# Patient Record
Sex: Male | Born: 1965 | Race: White | Hispanic: No | Smoking: Former smoker
Health system: Southern US, Community
[De-identification: ages and names within clinical notes are randomized; demographics above are authoritative.]

## PROBLEM LIST (undated history)

## (undated) DIAGNOSIS — Z87442 Personal history of urinary calculi: Secondary | ICD-10-CM

## (undated) DIAGNOSIS — R519 Headache, unspecified: Secondary | ICD-10-CM

## (undated) DIAGNOSIS — D3500 Benign neoplasm of unspecified adrenal gland: Secondary | ICD-10-CM

## (undated) DIAGNOSIS — R51 Headache: Secondary | ICD-10-CM

## (undated) DIAGNOSIS — K8020 Calculus of gallbladder without cholecystitis without obstruction: Secondary | ICD-10-CM

## (undated) DIAGNOSIS — I2510 Atherosclerotic heart disease of native coronary artery without angina pectoris: Secondary | ICD-10-CM

## (undated) DIAGNOSIS — A549 Gonococcal infection, unspecified: Secondary | ICD-10-CM

## (undated) DIAGNOSIS — I639 Cerebral infarction, unspecified: Secondary | ICD-10-CM

## (undated) DIAGNOSIS — N200 Calculus of kidney: Secondary | ICD-10-CM

## (undated) DIAGNOSIS — F4321 Adjustment disorder with depressed mood: Secondary | ICD-10-CM

## (undated) DIAGNOSIS — R319 Hematuria, unspecified: Secondary | ICD-10-CM

## (undated) DIAGNOSIS — T8859XA Other complications of anesthesia, initial encounter: Secondary | ICD-10-CM

## (undated) DIAGNOSIS — K219 Gastro-esophageal reflux disease without esophagitis: Secondary | ICD-10-CM

## (undated) DIAGNOSIS — T4145XA Adverse effect of unspecified anesthetic, initial encounter: Secondary | ICD-10-CM

## (undated) DIAGNOSIS — I219 Acute myocardial infarction, unspecified: Secondary | ICD-10-CM

## (undated) DIAGNOSIS — A6920 Lyme disease, unspecified: Secondary | ICD-10-CM

## (undated) DIAGNOSIS — R011 Cardiac murmur, unspecified: Secondary | ICD-10-CM

## (undated) HISTORY — PX: APPENDECTOMY: SHX54

## (undated) HISTORY — PX: INGUINAL HERNIA REPAIR: SUR1180

## (undated) HISTORY — PX: LITHOTRIPSY: SUR834

---

## 1998-04-17 ENCOUNTER — Emergency Department (HOSPITAL_COMMUNITY): Admission: EM | Admit: 1998-04-17 | Discharge: 1998-04-17 | Payer: Self-pay | Admitting: Emergency Medicine

## 2002-03-16 ENCOUNTER — Encounter: Payer: Self-pay | Admitting: *Deleted

## 2002-03-16 ENCOUNTER — Emergency Department (HOSPITAL_COMMUNITY): Admission: EM | Admit: 2002-03-16 | Discharge: 2002-03-16 | Payer: Self-pay | Admitting: *Deleted

## 2003-10-21 DIAGNOSIS — I219 Acute myocardial infarction, unspecified: Secondary | ICD-10-CM

## 2003-10-21 HISTORY — PX: CORONARY ANGIOPLASTY WITH STENT PLACEMENT: SHX49

## 2003-10-21 HISTORY — DX: Acute myocardial infarction, unspecified: I21.9

## 2004-05-04 ENCOUNTER — Inpatient Hospital Stay (HOSPITAL_COMMUNITY): Admission: EM | Admit: 2004-05-04 | Discharge: 2004-05-07 | Payer: Self-pay | Admitting: Emergency Medicine

## 2004-06-18 ENCOUNTER — Emergency Department (HOSPITAL_COMMUNITY): Admission: EM | Admit: 2004-06-18 | Discharge: 2004-06-18 | Payer: Self-pay | Admitting: Emergency Medicine

## 2004-07-04 ENCOUNTER — Ambulatory Visit (HOSPITAL_COMMUNITY): Admission: RE | Admit: 2004-07-04 | Discharge: 2004-07-04 | Payer: Self-pay | Admitting: Urology

## 2006-01-21 ENCOUNTER — Emergency Department (HOSPITAL_COMMUNITY): Admission: EM | Admit: 2006-01-21 | Discharge: 2006-01-21 | Payer: Self-pay | Admitting: Emergency Medicine

## 2009-07-18 ENCOUNTER — Emergency Department (HOSPITAL_COMMUNITY): Admission: EM | Admit: 2009-07-18 | Discharge: 2009-07-18 | Payer: Self-pay | Admitting: Emergency Medicine

## 2011-01-24 LAB — CBC
HCT: 38.2 % — ABNORMAL LOW (ref 39.0–52.0)
Hemoglobin: 12.9 g/dL — ABNORMAL LOW (ref 13.0–17.0)
MCV: 92.3 fL (ref 78.0–100.0)
Platelets: 265 10*3/uL (ref 150–400)
RBC: 4.14 MIL/uL — ABNORMAL LOW (ref 4.22–5.81)
WBC: 13.8 10*3/uL — ABNORMAL HIGH (ref 4.0–10.5)

## 2011-01-24 LAB — BASIC METABOLIC PANEL
CO2: 26 mEq/L (ref 19–32)
Chloride: 105 mEq/L (ref 96–112)
Creatinine, Ser: 0.85 mg/dL (ref 0.4–1.5)
GFR calc Af Amer: >60 mL/min (ref >60)
Glucose, Bld: 122 mg/dL — ABNORMAL HIGH (ref 70–99)

## 2011-01-24 LAB — URINALYSIS, ROUTINE W REFLEX MICROSCOPIC
Glucose, UA: NEGATIVE mg/dL
Nitrite: NEGATIVE
Specific Gravity, Urine: 1.016 (ref 1.005–1.030)
pH: 6 (ref 5.0–8.0)

## 2011-01-24 LAB — URINE CULTURE

## 2011-01-24 LAB — URINE MICROSCOPIC-ADD ON

## 2011-01-24 LAB — DIFFERENTIAL
Eosinophils Relative: 0 % (ref 0–5)
Lymphocytes Relative: 10 % — ABNORMAL LOW (ref 12–46)
Lymphs Abs: 1.4 10*3/uL (ref 0.7–4.0)
Monocytes Relative: 4 % (ref 3–12)

## 2011-03-07 NOTE — Discharge Summary (Signed)
Leon Allen, Leon Allen                        ACCOUNT NO.:  [id number]   MEDICAL RECORD NO.:  [id number]                   PATIENT TYPE:  INP   LOCATION:  2023                                 FACILITY:  [facility]   PHYSICIAN:  Vesta Mixer, M.D.              DATE OF BIRTH:  10-May-1966   DATE OF ADMISSION:  05/04/2004  DATE OF DISCHARGE:  05/07/2004                                 DISCHARGE SUMMARY   DISCHARGE DIAGNOSES:  1. Acute anterior apical/inferior myocardial infarction.  2. History of cigarette smoking.   DISCHARGE MEDICATIONS:  1. Aspirin 325 mg a day.  2. Plavix 75 mg a day.  3. Metoprolol 25 mg p.o. b.i.d.  4. Altace 2.5 mg a day.  5. Nitroglycerin 0.4 mg sublingually as needed for chest pain.   DISPOSITION:  The patient is to eat a low-fat, low cholesterol diet. He is  to see Dr. Elease Hashimoto in 1-2 weeks.   HISTORY:  Leon Allen is a 45 year old gentleman with no significant past  medical history. He presented on May 04, 2004 with episodes of chest pain  and an EKG that suggested an inferior wall myocardial infarction (please see  History and Physical for further details).   HOSPITAL COURSE:  Acute myocardial infarction.  The patient was taken to the  catheterization lab. He was found to have a plaque rupture in his mid-left  anterior descending artery. The LAD reached across the apex and supplied the  inferior apical wall. There was evidence of a thrombus in the anterior  apical segment which probably originated in the mid-LAD. He underwent  successful PTCA and stenting of the mid-LAD stenosis using a 3.5 x 15 mm  Taxis stent post-dilated, using a 4.0 Powersail balloon. He tolerated the  procedure quite well. We were not able to advance the balloon down past the  embolus. We treated him with Integrilin and heparin for this embolus. He  will also be treated with Plavix, and this should resolve some of this  embolus. He did have some AIVR the following day which suggests  that the  thrombus had begun to resolve. The patient did well for the next several  days and was up ambulating without problems. He will be discharged on the  above-noted medications. A fasting lipid profile is pending on the day of  discharge, and we will start him on stat medications if appropriate.                                                Vesta Mixer, M.D.    PJN/MEDQ  D:  05/07/2004  T:  05/07/2004  Job:  [id number]

## 2011-03-07 NOTE — Cardiovascular Report (Signed)
NAME:  GLADE, STRAUSSER NO.:  [id number]   MEDICAL RECORD NO.:  [id number]                   PATIENT TYPE:  INP   LOCATION:  1830                                 FACILITY:  [facility]   PHYSICIAN:  Vesta Mixer, M.D.              DATE OF BIRTH:  11-Feb-1966   DATE OF PROCEDURE:  05/05/2004  DATE OF DISCHARGE:                              CARDIAC CATHETERIZATION   Mr. Shillingburg is a 45 year old gentleman who started having chest pain earlier  this evening.  He was brought to the emergency room and found to have ST  elevation in the inferior leads.  He as brought to the catheterization lab  for urgent heart catheterization.   PROCEDURE:  Left heart catheterization with coronary angiography and  percutaneous transluminal cardiac angioplasty and stenting of the mid left  anterior descending artery.   CARDIOLOGIST:  Vesta Mixer, M.D.   DESCRIPTION OF PROCEDURE:  The right femoral artery was easily cannulated  using a modified  Seldinger technique.   HEMODYNAMICS:  1. Left ventricular pressure 111/12/.  2. Aortic pressure 107/71.   ANGIOGRAPHY:  1. The left main is smooth and normal.  2. The left anterior descending artery is normal in the proximal segment.     The mid lesion has an ulcerated plaque which represents a 75 to 80%     stenosis.  The distal LAD is fairly normal.  The LAD wraps around the     apex and supplies at least the distal third to distal one-half of the     inferior apical wall.  There is a thrombus that obstructs the flow in the     distal-most aspect of the LAD.  3. The left circumflex artery is fairly large and is fairly normal.  4. The right coronary artery is large and dominant.  It is smooth and normal     throughout its course.  The posterior descending artery and the     posterolateral segment artery are normal.   Left ventriculogram was performed in a 30 RAO position.  It reveals mildly  depressed left ventricular  systolic function with an ejection fraction of  around 45 to 50%.  There is moderate hypokinesis of the intra-apical wall  which corresponds to the location of the distal LAD obstruction.   PTCA:  A 7-French Judkins left 4 guide was used to cannulate the left main;  4300 units of heparin were given followed by a double bolus Integrilin drip.  A BMW wire was placed down into the left anterior descending artery followed  by a 3.5 x 16 mm Taxus stent.  This stent was deployed at 16 atmospheres at  the mid LAD.  The stent balloon was removed, and post stent dilatation was  achieved using a 4.0 x 15 mm Powersail balloon.  It was inflated twice up to  12 atmospheres for 20 seconds each.  This resulted in  a significantly  improved vessel lumen.  There was brisk flow.  There were no complications.   The patient will be treated with Integrilin for 18 hours. We will not be  able to dislodge that thrombus, but hopefully it will resolve on the  Integrilin and heparin.   COMPLICATIONS:  None.   CONCLUSIONS:  1. Successful percutaneous transluminal cardiac angioplasty and stenting of     the mid left anterior descending artery.  2. Acute anteroapical and apical inferior myocardial infarction.                                               Vesta Mixer, M.D.    PJN/MEDQ  D:  05/05/2004  T:  05/05/2004  Job:  [id number]

## 2011-06-01 ENCOUNTER — Emergency Department (HOSPITAL_COMMUNITY)
Admission: EM | Admit: 2011-06-01 | Discharge: 2011-06-01 | Disposition: A | Discharge status: Home / Self Care | Payer: Self-pay | Attending: Emergency Medicine | Admitting: Emergency Medicine

## 2011-06-01 ENCOUNTER — Emergency Department (HOSPITAL_COMMUNITY): Payer: Self-pay

## 2011-06-01 DIAGNOSIS — N201 Calculus of ureter: Secondary | ICD-10-CM | POA: Insufficient documentation

## 2011-06-01 DIAGNOSIS — R109 Unspecified abdominal pain: Secondary | ICD-10-CM | POA: Insufficient documentation

## 2011-06-01 DIAGNOSIS — Z7982 Long term (current) use of aspirin: Secondary | ICD-10-CM | POA: Insufficient documentation

## 2011-06-01 DIAGNOSIS — R110 Nausea: Secondary | ICD-10-CM | POA: Insufficient documentation

## 2011-06-01 DIAGNOSIS — N1330 Unspecified hydronephrosis: Secondary | ICD-10-CM | POA: Insufficient documentation

## 2011-06-01 DIAGNOSIS — I252 Old myocardial infarction: Secondary | ICD-10-CM | POA: Insufficient documentation

## 2011-06-01 LAB — POCT I-STAT, CHEM 8
Calcium, Ion: 1.14 mmol/L (ref 1.12–1.32)
Chloride: 107 mEq/L (ref 96–112)
Creatinine, Ser: 1.1 mg/dL (ref 0.50–1.35)
Glucose, Bld: 139 mg/dL — ABNORMAL HIGH (ref 70–99)
Hemoglobin: 16 g/dL (ref 13.0–17.0)
Potassium: 4.2 mEq/L (ref 3.5–5.1)

## 2011-06-01 LAB — URINALYSIS, ROUTINE W REFLEX MICROSCOPIC
Bilirubin Urine: NEGATIVE
Specific Gravity, Urine: 1.023 (ref 1.005–1.030)
pH: 5.5 (ref 5.0–8.0)

## 2011-06-01 LAB — URINE MICROSCOPIC-ADD ON

## 2011-06-02 LAB — URINE CULTURE

## 2012-05-03 ENCOUNTER — Encounter (HOSPITAL_COMMUNITY): Payer: Self-pay | Admitting: Emergency Medicine

## 2012-05-03 ENCOUNTER — Emergency Department (INDEPENDENT_AMBULATORY_CARE_PROVIDER_SITE_OTHER)
Admission: EM | Admit: 2012-05-03 | Discharge: 2012-05-03 | Disposition: A | Discharge status: Home / Self Care | Payer: Self-pay | Source: Home / Self Care

## 2012-05-03 ENCOUNTER — Emergency Department (INDEPENDENT_AMBULATORY_CARE_PROVIDER_SITE_OTHER): Payer: Self-pay

## 2012-05-03 DIAGNOSIS — S060X9A Concussion with loss of consciousness of unspecified duration, initial encounter: Secondary | ICD-10-CM

## 2012-05-03 DIAGNOSIS — S52209A Unspecified fracture of shaft of unspecified ulna, initial encounter for closed fracture: Secondary | ICD-10-CM

## 2012-05-03 HISTORY — DX: Acute myocardial infarction, unspecified: I21.9

## 2012-05-03 NOTE — ED Notes (Signed)
PT HEREWITH RIGHT LATERAL SWELLING AND POSS FX FROM TREE INJURY AT 3 PM TODAY.STATES HE WAS ON A TRACTOR AND BUMPED A TREE AND A TREE LIMB BROKE IN TWO PIECES HITTING LEFT SIDE OF FACE AND ARM.SWELLING AND TENDER WITH HUGE BRUISE TO L SIDE FACE.ABLE TO MAKE FIST BUT UNABLE TO MOVE ARM.ELEVATED/ICE

## 2012-05-03 NOTE — ED Notes (Signed)
Updated on wait.  Ortho tech en route.

## 2012-05-03 NOTE — ED Provider Notes (Signed)
Leon Allen is a 46 y.o. male who presents to Urgent Care today for right arm fracture. The patient was on his tractor today when a tree branch fell on to him. He guarded his head with his forearm. He suffered immediate pain and swelling of the ulnar forearm.  The tree branch also hit his head. He does not think he lost consciousness it is no longer having any headache or loss of coordination. He feels well  without any pain otherwise.  PMH reviewed.  History of a myocardial infarction 11 years ago History  Substance Use Topics  . Smoking status: Not on file  . Smokeless tobacco: Not on file  . Alcohol Use:    ROS as above Medications reviewed. No current facility-administered medications for this encounter.   Current Outpatient Prescriptions  Medication Sig Dispense Refill  . aspirin 81 MG chewable tablet Chew 81 mg by mouth daily.        Exam:  BP 149/90  Pulse 78  Temp 98 F (36.7 C) (Oral)  Resp 18  SpO2 97% Gen: Well NAD HEENT: EOMI,  MMM  Bruise on left jaw at the level of TMJ. No other bruises on head. Left arm: Tender at the mid ulna.  Normal sensation and capillary refill distally.  Neuro: Alert and oriented normally conversant. Pupils equal round reactive to light. Nonfocal exam  No results found for this or any previous visit (from the past 24 hour(s)). Dg Forearm Right  05/03/2012  *RADIOLOGY REPORT*  Clinical Data: Right arm trauma and pain  RIGHT FOREARM - 2 VIEW  Comparison: None.  Findings: There is an oblique fractures of the midshaft of the right ulna.  Overlying soft tissue swelling is noted.  No radiopaque foreign body.  Allowing for technique, no dislocation is seen.  IMPRESSION: Oblique fracture through the mid shaft of the right ulna.  Original Report Authenticated By: Harrel Lemon, M.D.    Assessment and Plan: 46 y.o. male with ulnar fracture. Plan to place patient in a posterior arm splint, and have patient followup with orthopedics in a few  days. [facility] is on call tonight; will refer there.   Additionally he may have a concussion. Discussed warning signs or symptoms patient expresses understanding.

## 2012-05-03 NOTE — Progress Notes (Signed)
Orthopedic Tech Progress Note Patient Details:  Leon Allen 12-Apr-1966 [id number]  Ortho Devices Type of Ortho Device: Long arm splint Ortho Device/Splint Location: left arm Ortho Device/Splint Interventions: Application   Nikki Dom 05/03/2012, 6:45 PM

## 2012-05-03 NOTE — ED Notes (Signed)
Ortho tech present. 

## 2012-05-06 NOTE — ED Provider Notes (Signed)
Medical screening examination/treatment/procedure(s) were performed by a resident physician and as supervising physician I was immediately available for consultation/collaboration.  Leslee Home, M.D.   Reuben Likes, MD 05/06/12 2105

## 2013-09-23 ENCOUNTER — Other Ambulatory Visit: Payer: Self-pay | Admitting: Pulmonary Disease

## 2013-10-20 DIAGNOSIS — A549 Gonococcal infection, unspecified: Secondary | ICD-10-CM

## 2013-10-20 DIAGNOSIS — I639 Cerebral infarction, unspecified: Secondary | ICD-10-CM

## 2013-10-20 HISTORY — DX: Gonococcal infection, unspecified: A54.9

## 2013-10-20 HISTORY — DX: Cerebral infarction, unspecified: I63.9

## 2014-06-14 ENCOUNTER — Emergency Department (HOSPITAL_COMMUNITY)
Admission: EM | Admit: 2014-06-14 | Discharge: 2014-06-14 | Disposition: A | Discharge status: Home / Self Care | Payer: Self-pay | Attending: Emergency Medicine | Admitting: Emergency Medicine

## 2014-06-14 ENCOUNTER — Encounter (HOSPITAL_COMMUNITY): Payer: Self-pay | Admitting: Emergency Medicine

## 2014-06-14 ENCOUNTER — Emergency Department (HOSPITAL_COMMUNITY): Payer: Self-pay

## 2014-06-14 DIAGNOSIS — I252 Old myocardial infarction: Secondary | ICD-10-CM | POA: Insufficient documentation

## 2014-06-14 DIAGNOSIS — R42 Dizziness and giddiness: Secondary | ICD-10-CM | POA: Insufficient documentation

## 2014-06-14 DIAGNOSIS — R519 Headache, unspecified: Secondary | ICD-10-CM

## 2014-06-14 DIAGNOSIS — R51 Headache: Secondary | ICD-10-CM | POA: Insufficient documentation

## 2014-06-14 DIAGNOSIS — Z880 Allergy status to penicillin: Secondary | ICD-10-CM | POA: Insufficient documentation

## 2014-06-14 DIAGNOSIS — Z87442 Personal history of urinary calculi: Secondary | ICD-10-CM | POA: Insufficient documentation

## 2014-06-14 HISTORY — DX: Calculus of kidney: N20.0

## 2014-06-14 LAB — I-STAT CHEM 8, ED
BUN: 15 mg/dL (ref 6–23)
CALCIUM ION: 1.12 mmol/L (ref 1.12–1.23)
Chloride: 108 mEq/L (ref 96–112)
Creatinine, Ser: 0.8 mg/dL (ref 0.50–1.35)
Glucose, Bld: 103 mg/dL — ABNORMAL HIGH (ref 70–99)
HEMATOCRIT: 46 % (ref 39.0–52.0)
HEMOGLOBIN: 15.6 g/dL (ref 13.0–17.0)
Potassium: 3.7 mEq/L (ref 3.7–5.3)
SODIUM: 137 meq/L (ref 137–147)
TCO2: 24 mmol/L (ref 0–100)

## 2014-06-14 MED ORDER — IOHEXOL 350 MG/ML SOLN
80.0000 mL | Freq: Once | INTRAVENOUS | Status: AC | PRN
  Administered 2014-06-14: 80 mL via INTRAVENOUS

## 2014-06-14 MED ORDER — SODIUM CHLORIDE 0.9 % IV BOLUS (SEPSIS)
1000.0000 mL | Freq: Once | INTRAVENOUS | Status: AC
  Administered 2014-06-14: 1000 mL via INTRAVENOUS

## 2014-06-14 NOTE — ED Provider Notes (Signed)
CSN: [id number]     Arrival date & time 06/14/14  6812 History   First MD Initiated Contact with Patient 06/14/14 1002     Chief Complaint  Patient presents with  . Headache     (Consider location/radiation/quality/duration/timing/severity/associated sxs/prior Treatment) HPI 48 year old male presents with intermittent headaches over last 2 months. He states that 2 months ago he had dental pain and took one of his friend's OxyContin. He states he crushed it with a hammer first as he cannot take a full dose. He states immediately after taking it he felt like he is going to pass out. He was able to make it to the couch but before he passed out had a severe pain in his neck that shot to his head. He states it felt like a pop like my kidney stone comes out. Since then his been having intermittent headaches. He currently has a mild headache behind his eyes he describes a pressure. The headache is never felt as severe as it did when he was about to pass out 2 months ago. Denies a weakness or numbness. States he intermittently feels lightheaded. Friends and family told him to get his head checked out for an aneurysm so he went to his PCP who sent him here after a pull them about his headache. He is also questioning if he has a hernia to his RUQ abdominal wall due to a knot there that seems to increase with eating.  Past Medical History  Diagnosis Date  . Heart attack 11 YRS AGO  . Kidney stone    Past Surgical History  Procedure Laterality Date  . Appendectomy    . Hernia repair    . Coronary angioplasty with stent placement     No family history on file. History  Substance Use Topics  . Smoking status: Never Smoker   . Smokeless tobacco: Not on file  . Alcohol Use: Yes    Review of Systems  Constitutional: Negative for fever.  Eyes: Negative for visual disturbance.  Respiratory: Negative for shortness of breath.   Cardiovascular: Negative for chest pain.  Gastrointestinal: Negative for  vomiting.       Knot to RUQ  Neurological: Positive for light-headedness and headaches. Negative for weakness and numbness.  All other systems reviewed and are negative.     Allergies  Bee venom and Penicillins  Home Medications   Prior to Admission medications   Medication Sig Start Date End Date Taking? Authorizing Provider  aspirin 81 MG chewable tablet Chew 81 mg by mouth daily.    Historical Provider, MD   There were no vitals taken for this visit. Physical Exam  Nursing note and vitals reviewed. Constitutional: He is oriented to person, place, and time. He appears well-developed and well-nourished. No distress.  HENT:  Head: Normocephalic and atraumatic.  Right Ear: External ear normal.  Left Ear: External ear normal.  Nose: Nose normal.  Eyes: EOM are normal. Pupils are equal, round, and reactive to light. Right eye exhibits no discharge. Left eye exhibits no discharge.  Neck: Normal range of motion. Neck supple.  Cardiovascular: Normal rate, regular rhythm, normal heart sounds and intact distal pulses.   Pulmonary/Chest: Effort normal and breath sounds normal.  Abdominal: Soft. He exhibits no distension. There is no tenderness.  Musculoskeletal: He exhibits no edema.  Neurological: He is alert and oriented to person, place, and time.  Reflex Scores:      Bicep reflexes are 2+ on the right side and 2+ on  the left side.      Patellar reflexes are 2+ on the right side and 2+ on the left side. CN 2-12 grossly intact. 5/5 strength in all 4 extremities  Skin: Skin is warm and dry.    ED Course  Procedures (including critical care time) Labs Review Labs Reviewed  I-STAT CHEM 8, ED - Abnormal; Notable for the following:    Glucose, Bld 103 (*)    All other components within normal limits    Imaging Review Ct Angio Head W/cm &/or Wo Cm  06/14/2014   CLINICAL DATA:  Headache for few months, sensation of neck popping followed by severe pain.  EXAM: CT ANGIOGRAPHY HEAD  AND NECK  TECHNIQUE: Multidetector CT imaging of the head and neck was performed using the standard protocol during bolus administration of intravenous contrast. Multiplanar CT image reconstructions and MIPs were obtained to evaluate the vascular anatomy. Carotid stenosis measurements (when applicable) are obtained utilizing NASCET criteria, using the distal internal carotid diameter as the denominator.  CONTRAST:  54mL OMNIPAQUE IOHEXOL 350 MG/ML SOLN  COMPARISON:  None.  FINDINGS: CTA HEAD FINDINGS  No evidence for acute infarction, hemorrhage, mass lesion, hydrocephalus, or extra-axial fluid. All cerebral volume probably within normal limits for age. Post infusion, no abnormal enhancement of the brain or meninges. Calvarium intact. No sinus or mastoid disease.  The internal carotid arteries are widely patent. There is minor calcific atheromatous change in the carotid siphon greater on the RIGHT.  The basilar artery is widely patent. Both vertebrals contribute, with the LEFT slightly larger.  There is no intracranial stenosis or aneurysm. Major dural venous sinuses are patent. No orbital findings. Mild chronic sinus disease.  Review of the MIP images confirms the above findings.  CTA NECK FINDINGS  Extensive emphysematous changes throughout the lung apex. No pulmonary nodule. No mediastinal masses.  Conventional branching of the great vessels from the arch. No Proximal stenosis. No neck masses. 7 mm LEFT thyroid lobe lesion, apparent simple cyst. Airway midline. No adenopathy.  The common carotid and internal carotid arteries are widely patent. There is no significant atheromatous change at the bifurcation. No evidence for carotid dissection. Moderate dolichoectasia in the cervical region without fibromuscular change.  BILATERAL vertebral arteries are widely patent. No ostial stenosis. No narrowing in the neck. No abnormality at the skull base or foramen magnum.  Moderate cervical spondylosis with slight reversal  of the normal cervical lordotic curve. No features to suggest an acute osseous abnormality.  Review of the MIP images confirms the above findings.  IMPRESSION: Unremarkable CT angiography of the head and neck. No flow reducing lesion, dissection, or other acute findings.   Electronically Signed   By: Rolla Flatten M.D.   On: 06/14/2014 13:16   Ct Angio Neck W/cm &/or Wo/cm  06/14/2014   CLINICAL DATA:  Headache for few months, sensation of neck popping followed by severe pain.  EXAM: CT ANGIOGRAPHY HEAD AND NECK  TECHNIQUE: Multidetector CT imaging of the head and neck was performed using the standard protocol during bolus administration of intravenous contrast. Multiplanar CT image reconstructions and MIPs were obtained to evaluate the vascular anatomy. Carotid stenosis measurements (when applicable) are obtained utilizing NASCET criteria, using the distal internal carotid diameter as the denominator.  CONTRAST:  34mL OMNIPAQUE IOHEXOL 350 MG/ML SOLN  COMPARISON:  None.  FINDINGS: CTA HEAD FINDINGS  No evidence for acute infarction, hemorrhage, mass lesion, hydrocephalus, or extra-axial fluid. All cerebral volume probably within normal limits for age. Post  infusion, no abnormal enhancement of the brain or meninges. Calvarium intact. No sinus or mastoid disease.  The internal carotid arteries are widely patent. There is minor calcific atheromatous change in the carotid siphon greater on the RIGHT.  The basilar artery is widely patent. Both vertebrals contribute, with the LEFT slightly larger.  There is no intracranial stenosis or aneurysm. Major dural venous sinuses are patent. No orbital findings. Mild chronic sinus disease.  Review of the MIP images confirms the above findings.  CTA NECK FINDINGS  Extensive emphysematous changes throughout the lung apex. No pulmonary nodule. No mediastinal masses.  Conventional branching of the great vessels from the arch. No Proximal stenosis. No neck masses. 7 mm LEFT thyroid  lobe lesion, apparent simple cyst. Airway midline. No adenopathy.  The common carotid and internal carotid arteries are widely patent. There is no significant atheromatous change at the bifurcation. No evidence for carotid dissection. Moderate dolichoectasia in the cervical region without fibromuscular change.  BILATERAL vertebral arteries are widely patent. No ostial stenosis. No narrowing in the neck. No abnormality at the skull base or foramen magnum.  Moderate cervical spondylosis with slight reversal of the normal cervical lordotic curve. No features to suggest an acute osseous abnormality.  Review of the MIP images confirms the above findings.  IMPRESSION: Unremarkable CT angiography of the head and neck. No flow reducing lesion, dissection, or other acute findings.   Electronically Signed   By: Rolla Flatten M.D.   On: 06/14/2014 13:16     EKG Interpretation None      MDM   Final diagnoses:  Acute nonintractable headache, unspecified headache type    Patient's headache appears benign at this time. Given that he had acute onset headache with syncope 2 months ago, CT and by mouth of head and neck was obtained. Shows no aneurysm or other vessel disease. No obvious blood. His symptoms are not consistent with meningitis, encephalitis, or mass. At this time is stable for outpatient follow up with his PCP.    Ephraim Hamburger, MD 06/14/14 [phone]

## 2014-06-14 NOTE — Discharge Instructions (Signed)
You are having a headache. No specific cause was found today for your headache. It may have been a migraine or other cause of headache. Stress, anxiety, fatigue, and depression are common triggers for headaches. Your headache today does not appear to be life-threatening or require hospitalization, but often the exact cause of headaches is not determined in the emergency department. Therefore, follow-up with your doctor is very important to find out what may have caused your headache, and whether or not you need any further diagnostic testing or treatment. Sometimes headaches can appear benign (not harmful), but then more serious symptoms can develop which should prompt an immediate re-evaluation by your doctor or the emergency department. SEEK MEDICAL ATTENTION IF: You develop possible problems with medications prescribed.  The medications don't resolve your headache, if it recurs , or if you have multiple episodes of vomiting or can't take fluids. You have a change from the usual headache. RETURN IMMEDIATELY IF you develop a sudden, severe headache or confusion, become poorly responsive or faint, develop a fever above 100.30F or problem breathing, have a change in speech, vision, swallowing, or understanding, or develop new weakness, numbness, tingling, incoordination, or have a seizure.    General Headache Without Cause A headache is pain or discomfort felt around the head or neck area. The specific cause of a headache may not be found. There are many causes and types of headaches. A few common ones are:  Tension headaches.  Migraine headaches.  Cluster headaches.  Chronic daily headaches. HOME CARE INSTRUCTIONS   Keep all follow-up appointments with your caregiver or any specialist referral.  Only take over-the-counter or prescription medicines for pain or discomfort as directed by your caregiver.  Lie down in a dark, quiet room when you have a headache.  Keep a headache journal to find  out what may trigger your migraine headaches. For example, write down:  What you eat and drink.  How much sleep you get.  Any change to your diet or medicines.  Try massage or other relaxation techniques.  Put ice packs or heat on the head and neck. Use these 3 to 4 times per day for 15 to 20 minutes each time, or as needed.  Limit stress.  Sit up straight, and do not tense your muscles.  Quit smoking if you smoke.  Limit alcohol use.  Decrease the amount of caffeine you drink, or stop drinking caffeine.  Eat and sleep on a regular schedule.  Get 7 to 9 hours of sleep, or as recommended by your caregiver.  Keep lights dim if bright lights bother you and make your headaches worse. SEEK MEDICAL CARE IF:   You have problems with the medicines you were prescribed.  Your medicines are not working.  You have a change from the usual headache.  You have nausea or vomiting. SEEK IMMEDIATE MEDICAL CARE IF:   Your headache becomes severe.  You have a fever.  You have a stiff neck.  You have loss of vision.  You have muscular weakness or loss of muscle control.  You start losing your balance or have trouble walking.  You feel faint or pass out.  You have severe symptoms that are different from your first symptoms. MAKE SURE YOU:   Understand these instructions.  Will watch your condition.  Will get help right away if you are not doing well or get worse. Document Released: 10/06/2005 Document Revised: 12/29/2011 Document Reviewed: 10/22/2011 [facility] Patient Information 2015 [place], [place]. This information is not  intended to replace advice given to you by your health care provider. Make sure you discuss any questions you have with your health care provider. ° °

## 2014-06-14 NOTE — ED Notes (Signed)
Returned from ct 

## 2014-06-14 NOTE — ED Notes (Signed)
TO CT

## 2014-06-14 NOTE — ED Notes (Addendum)
2 months ago passed out after  ntaking an oxy for tooth pain denies hitting head. States woke up w/ vomit in mouth  Felt a pain in his head every since then he has not felt well.  1 week bent over and felt woozy again  In head  Wooziness comes and goes and he thinks he may have annurysm in his head  And may have another kidney stone. Also has a knot in abd that has come up in the past m,onth

## 2015-12-19 DIAGNOSIS — R319 Hematuria, unspecified: Secondary | ICD-10-CM

## 2015-12-19 HISTORY — DX: Hematuria, unspecified: R31.9

## 2016-01-17 ENCOUNTER — Observation Stay (HOSPITAL_COMMUNITY)
Admission: EM | Admit: 2016-01-17 | Discharge: 2016-01-18 | Disposition: A | Discharge status: Home / Self Care | Payer: Self-pay | Attending: Emergency Medicine | Admitting: Emergency Medicine

## 2016-01-17 ENCOUNTER — Encounter (HOSPITAL_COMMUNITY): Payer: Self-pay

## 2016-01-17 ENCOUNTER — Other Ambulatory Visit: Payer: Self-pay

## 2016-01-17 ENCOUNTER — Emergency Department (HOSPITAL_COMMUNITY): Payer: Self-pay

## 2016-01-17 DIAGNOSIS — I253 Aneurysm of heart: Secondary | ICD-10-CM | POA: Insufficient documentation

## 2016-01-17 DIAGNOSIS — R531 Weakness: Secondary | ICD-10-CM | POA: Insufficient documentation

## 2016-01-17 DIAGNOSIS — Z87442 Personal history of urinary calculi: Secondary | ICD-10-CM

## 2016-01-17 DIAGNOSIS — R079 Chest pain, unspecified: Secondary | ICD-10-CM

## 2016-01-17 DIAGNOSIS — Z7982 Long term (current) use of aspirin: Secondary | ICD-10-CM | POA: Insufficient documentation

## 2016-01-17 DIAGNOSIS — F1290 Cannabis use, unspecified, uncomplicated: Secondary | ICD-10-CM | POA: Diagnosis present

## 2016-01-17 DIAGNOSIS — R61 Generalized hyperhidrosis: Secondary | ICD-10-CM | POA: Insufficient documentation

## 2016-01-17 DIAGNOSIS — Z880 Allergy status to penicillin: Secondary | ICD-10-CM | POA: Insufficient documentation

## 2016-01-17 DIAGNOSIS — I200 Unstable angina: Secondary | ICD-10-CM

## 2016-01-17 DIAGNOSIS — R5383 Other fatigue: Secondary | ICD-10-CM | POA: Insufficient documentation

## 2016-01-17 DIAGNOSIS — N200 Calculus of kidney: Secondary | ICD-10-CM

## 2016-01-17 DIAGNOSIS — R55 Syncope and collapse: Secondary | ICD-10-CM

## 2016-01-17 DIAGNOSIS — R0789 Other chest pain: Secondary | ICD-10-CM

## 2016-01-17 DIAGNOSIS — Z720 Tobacco use: Secondary | ICD-10-CM

## 2016-01-17 DIAGNOSIS — Z87891 Personal history of nicotine dependence: Secondary | ICD-10-CM | POA: Insufficient documentation

## 2016-01-17 DIAGNOSIS — M542 Cervicalgia: Principal | ICD-10-CM

## 2016-01-17 DIAGNOSIS — R319 Hematuria, unspecified: Secondary | ICD-10-CM

## 2016-01-17 DIAGNOSIS — K5790 Diverticulosis of intestine, part unspecified, without perforation or abscess without bleeding: Secondary | ICD-10-CM | POA: Diagnosis present

## 2016-01-17 DIAGNOSIS — I2510 Atherosclerotic heart disease of native coronary artery without angina pectoris: Secondary | ICD-10-CM

## 2016-01-17 DIAGNOSIS — D3501 Benign neoplasm of right adrenal gland: Secondary | ICD-10-CM

## 2016-01-17 DIAGNOSIS — K8020 Calculus of gallbladder without cholecystitis without obstruction: Secondary | ICD-10-CM | POA: Diagnosis present

## 2016-01-17 DIAGNOSIS — D3500 Benign neoplasm of unspecified adrenal gland: Secondary | ICD-10-CM | POA: Diagnosis present

## 2016-01-17 DIAGNOSIS — Z23 Encounter for immunization: Secondary | ICD-10-CM | POA: Insufficient documentation

## 2016-01-17 DIAGNOSIS — R109 Unspecified abdominal pain: Secondary | ICD-10-CM | POA: Insufficient documentation

## 2016-01-17 DIAGNOSIS — Z8673 Personal history of transient ischemic attack (TIA), and cerebral infarction without residual deficits: Secondary | ICD-10-CM

## 2016-01-17 DIAGNOSIS — Z955 Presence of coronary angioplasty implant and graft: Secondary | ICD-10-CM

## 2016-01-17 DIAGNOSIS — D3502 Benign neoplasm of left adrenal gland: Secondary | ICD-10-CM

## 2016-01-17 DIAGNOSIS — R918 Other nonspecific abnormal finding of lung field: Secondary | ICD-10-CM | POA: Diagnosis present

## 2016-01-17 HISTORY — DX: Cerebral infarction, unspecified: I63.9

## 2016-01-17 HISTORY — DX: Adverse effect of unspecified anesthetic, initial encounter: T41.45XA

## 2016-01-17 HISTORY — DX: Hematuria, unspecified: R31.9

## 2016-01-17 HISTORY — DX: Adjustment disorder with depressed mood: F43.21

## 2016-01-17 HISTORY — DX: Benign neoplasm of unspecified adrenal gland: D35.00

## 2016-01-17 HISTORY — DX: Gonococcal infection, unspecified: A54.9

## 2016-01-17 HISTORY — DX: Atherosclerotic heart disease of native coronary artery without angina pectoris: I25.10

## 2016-01-17 HISTORY — DX: Headache: R51

## 2016-01-17 HISTORY — DX: Headache, unspecified: R51.9

## 2016-01-17 HISTORY — DX: Other complications of anesthesia, initial encounter: T88.59XA

## 2016-01-17 LAB — I-STAT TROPONIN, ED
TROPONIN I, POC: 0 ng/mL (ref 0.00–0.08)
TROPONIN I, POC: 0 ng/mL (ref 0.00–0.08)

## 2016-01-17 LAB — URINALYSIS, ROUTINE W REFLEX MICROSCOPIC
Glucose, UA: NEGATIVE mg/dL
KETONES UR: 15 mg/dL — AB
NITRITE: POSITIVE — AB
Specific Gravity, Urine: 1.029 (ref 1.005–1.030)
pH: 5 (ref 5.0–8.0)

## 2016-01-17 LAB — RAPID URINE DRUG SCREEN, HOSP PERFORMED
AMPHETAMINES: NOT DETECTED
BARBITURATES: NOT DETECTED
BENZODIAZEPINES: NOT DETECTED
Cocaine: NOT DETECTED
Opiates: NOT DETECTED
Tetrahydrocannabinol: POSITIVE — AB

## 2016-01-17 LAB — LIPID PANEL
Cholesterol: 132 mg/dL (ref 0–200)
HDL: 34 mg/dL — ABNORMAL LOW (ref >40)
LDL CALC: 86 mg/dL (ref 0–99)
TRIGLYCERIDES: 60 mg/dL (ref <150)
Total CHOL/HDL Ratio: 3.9 RATIO
VLDL: 12 mg/dL (ref 0–40)

## 2016-01-17 LAB — CBC
HEMATOCRIT: 42.5 % (ref 39.0–52.0)
HEMOGLOBIN: 14.1 g/dL (ref 13.0–17.0)
MCH: 29.9 pg (ref 26.0–34.0)
MCHC: 33.2 g/dL (ref 30.0–36.0)
MCV: 90.2 fL (ref 78.0–100.0)
Platelets: 164 10*3/uL (ref 150–400)
RBC: 4.71 MIL/uL (ref 4.22–5.81)
RDW: 13.3 % (ref 11.5–15.5)
WBC: 5.7 10*3/uL (ref 4.0–10.5)

## 2016-01-17 LAB — BASIC METABOLIC PANEL
ANION GAP: 11 (ref 5–15)
BUN: 21 mg/dL — ABNORMAL HIGH (ref 6–20)
CALCIUM: 8.8 mg/dL — AB (ref 8.9–10.3)
CO2: 23 mmol/L (ref 22–32)
Chloride: 104 mmol/L (ref 101–111)
Creatinine, Ser: 0.93 mg/dL (ref 0.61–1.24)
GLUCOSE: 121 mg/dL — AB (ref 65–99)
POTASSIUM: 3.8 mmol/L (ref 3.5–5.1)
Sodium: 138 mmol/L (ref 135–145)

## 2016-01-17 LAB — PROTEIN / CREATININE RATIO, URINE
CREATININE, URINE: 203.86 mg/dL
Protein Creatinine Ratio: 0.53 mg/mg{Cre} — ABNORMAL HIGH (ref 0.00–0.15)
TOTAL PROTEIN, URINE: 109 mg/dL

## 2016-01-17 LAB — URINE MICROSCOPIC-ADD ON

## 2016-01-17 LAB — ETHANOL

## 2016-01-17 LAB — TSH: TSH: 1.043 u[IU]/mL (ref 0.350–4.500)

## 2016-01-17 LAB — TROPONIN I

## 2016-01-17 IMAGING — CT CT RENAL STONE PROTOCOL
2 of 4 series · 15 of 46 positions shown, 17 images · non-contrast
Comparison: CT scan [DATE]

CLINICAL DATA: Intermittent right flank pain for 1 month, hematuria

EXAM:
CT ABDOMEN AND PELVIS WITHOUT CONTRAST
TECHNIQUE: Multidetector CT imaging of the abdomen and pelvis was performed
following the standard protocol without IV contrast.

[Series 2: renal stone 5mm · axial · 0.76mm/px · z∈[-345,+110]mm · 12 of 101 slices shown, 14 images]
[im 5/101  soft-tissue]
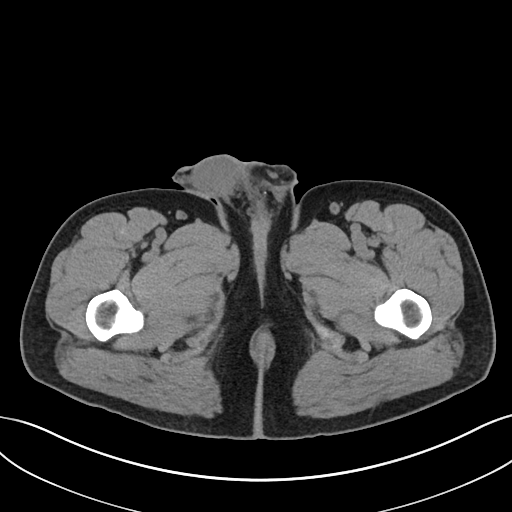
[im 5/101  bone]
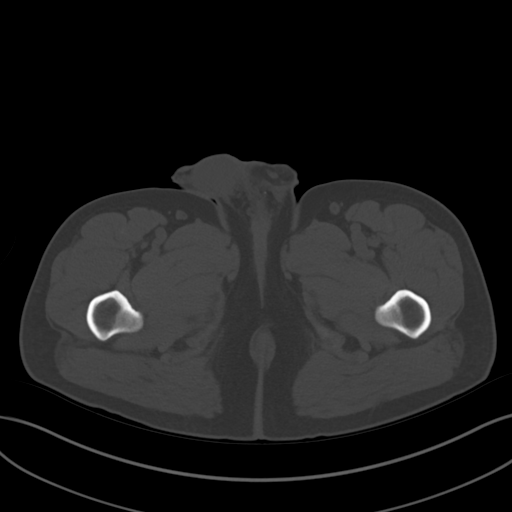
[im 13/101  soft-tissue]
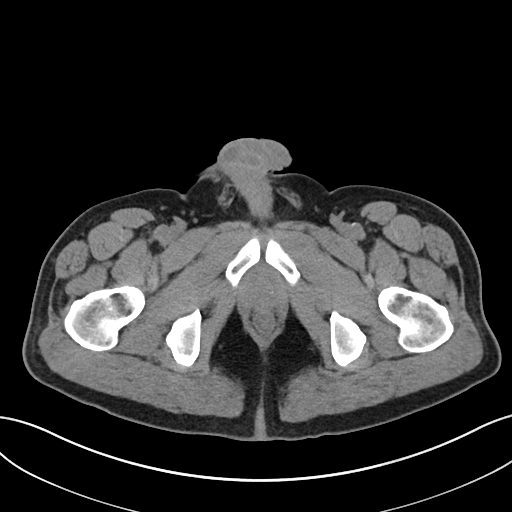
[im 21/101  soft-tissue]
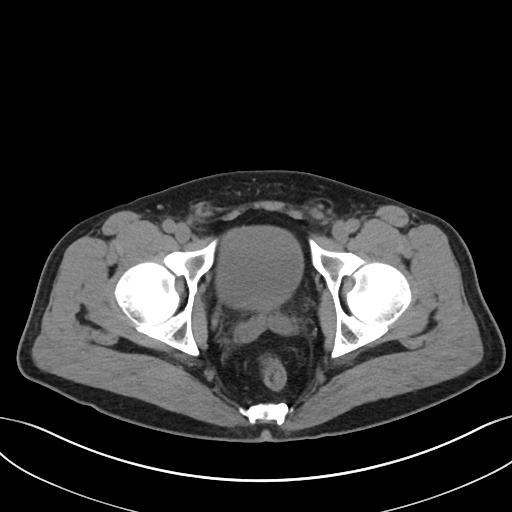
[im 30/101  soft-tissue]
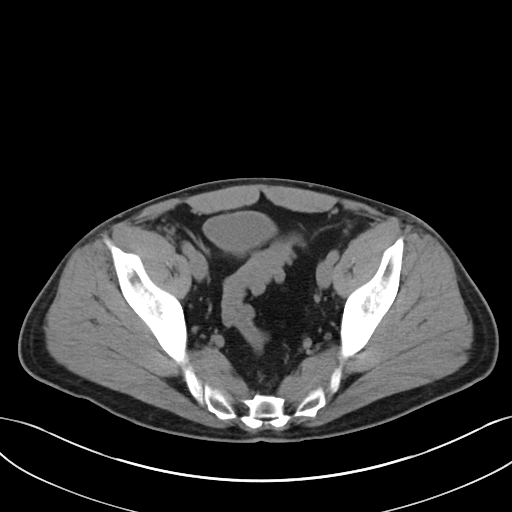
[im 38/101  soft-tissue]
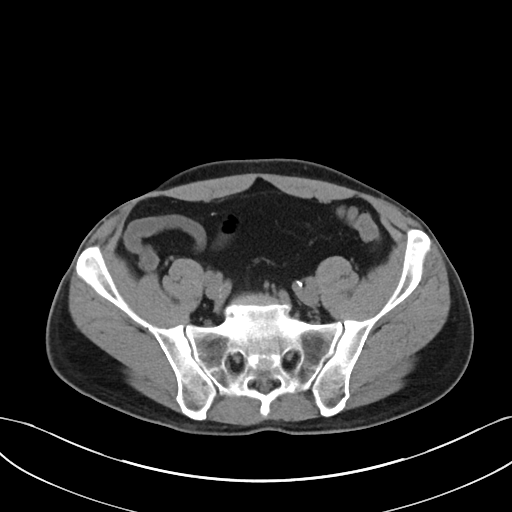
[im 46/101  soft-tissue]
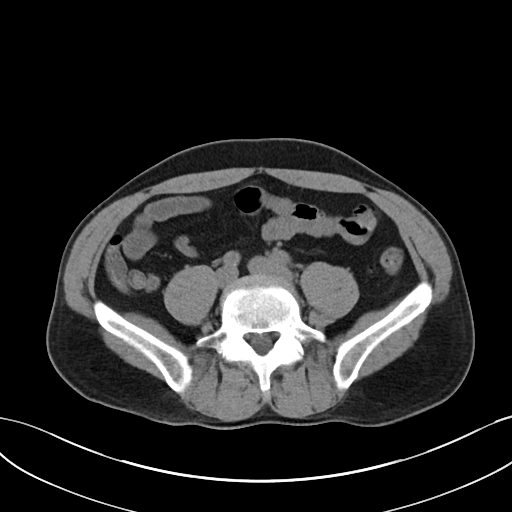
[im 55/101  soft-tissue]
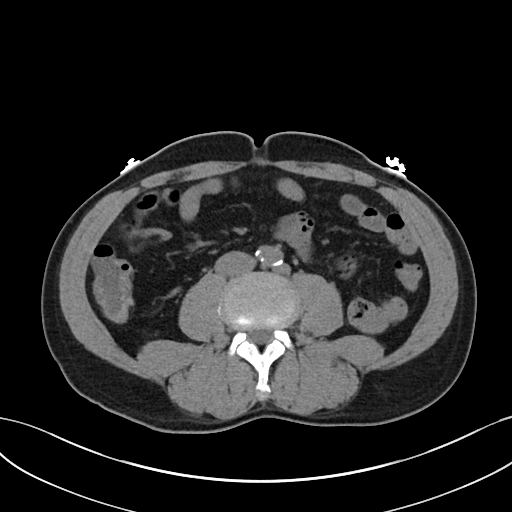
[im 63/101  soft-tissue]
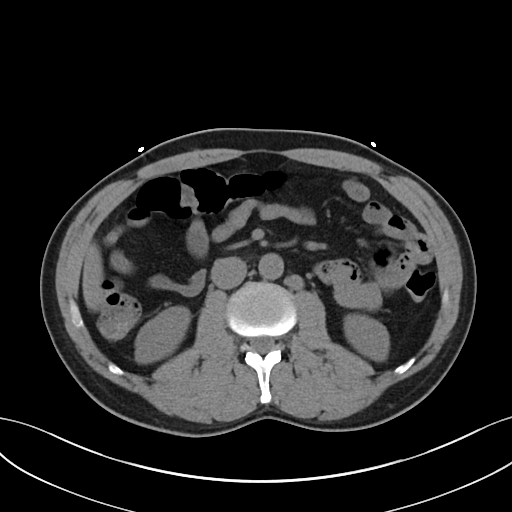
[im 71/101  soft-tissue]
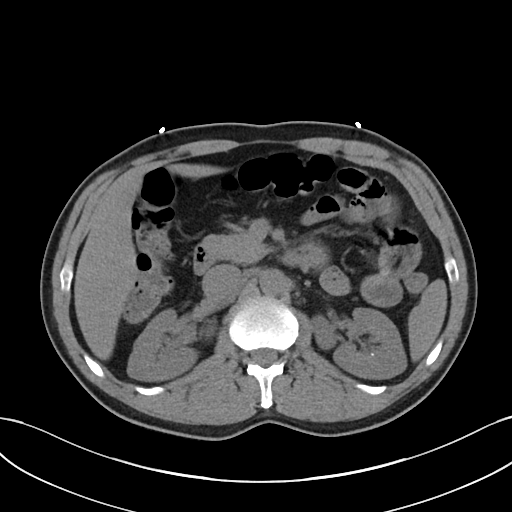
[im 71/101  bone]
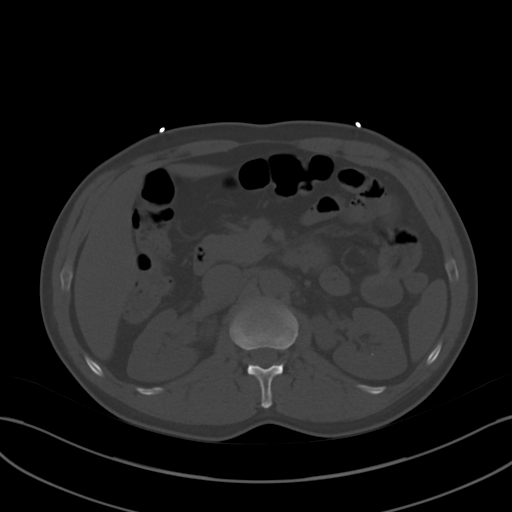
[im 80/101  soft-tissue]
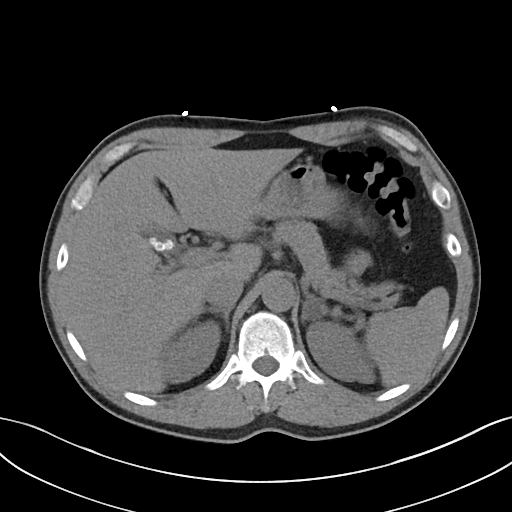
[im 88/101  soft-tissue]
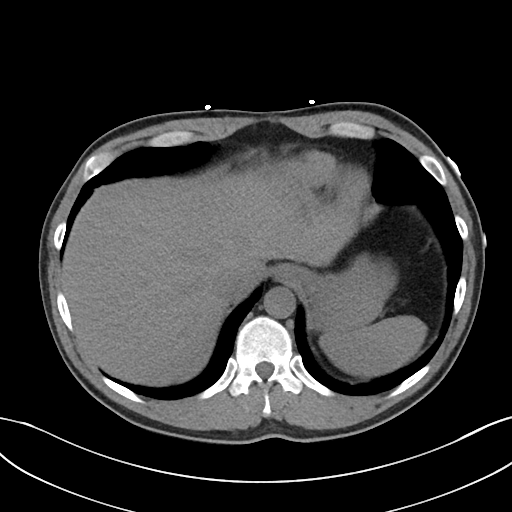
[im 96/101  soft-tissue]
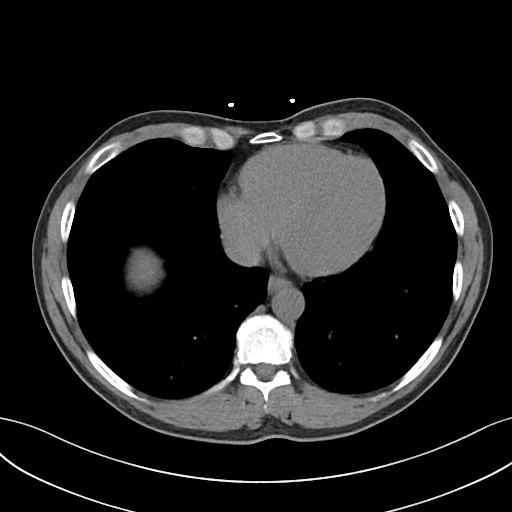

[Series 4: renal stone 3.0 cor · coronal · 0.72mm/px · 3 of 80 slices shown]
[im 27/80  soft-tissue]
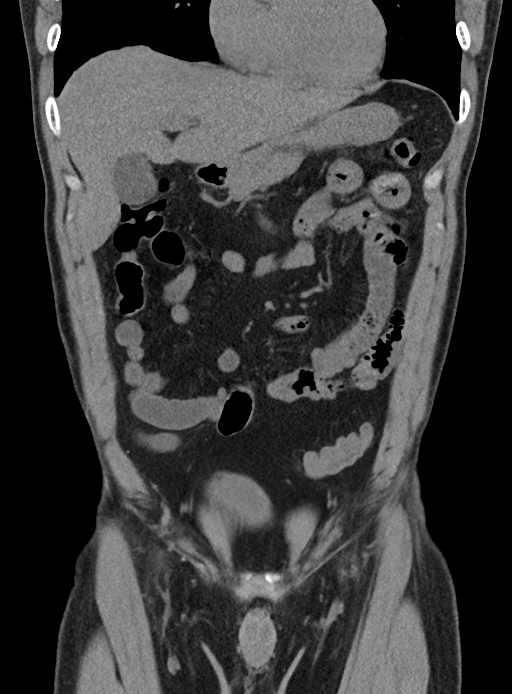
[im 36/80  soft-tissue]
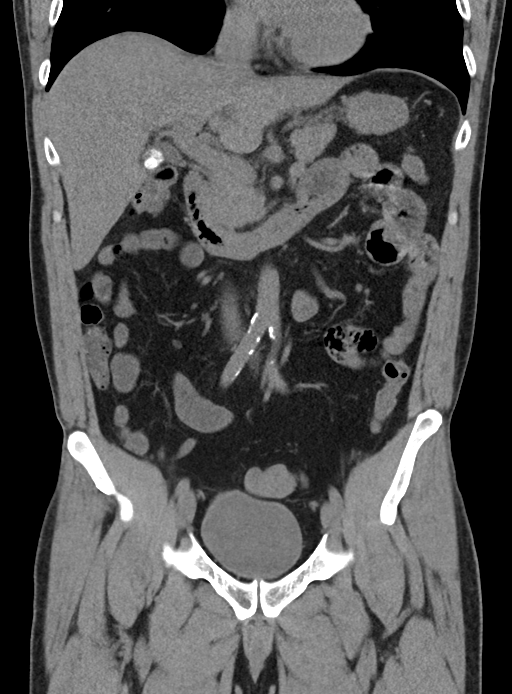
[im 44/80  soft-tissue]
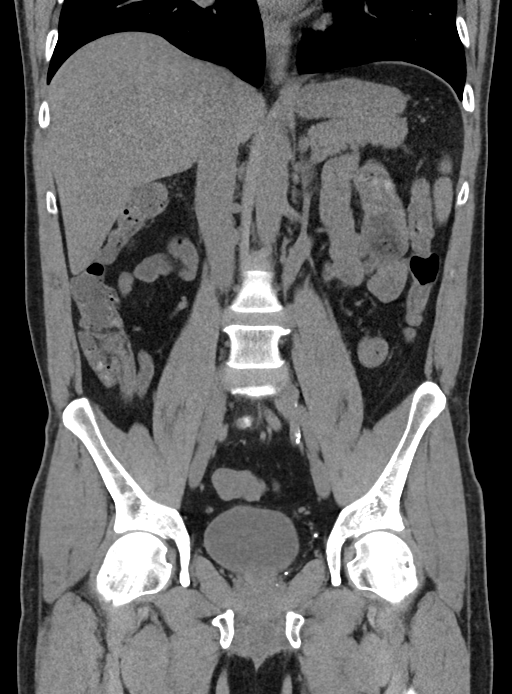

[15 of 46 positions shown; findings below may reference images not displayed]

FINDINGS: Lower chest: Axial image 7 subpleural nodule in left lower lobe
again noted measures 7 mm stable in size in appearance from prior
exam. Second subpleural nodule in left lower lobe measures 6 mm
stable in size in appearance from prior exam. No new pulmonary
nodules are noted in visualized lung bases.

Hepatobiliary: Unenhanced liver shows no biliary ductal dilatation.
Multiple gallstones are noted within gallbladder the largest
measures 7.8 mm. No CBD dilatation.

Pancreas: Unenhanced pancreas is unremarkable.

Spleen: Unenhanced spleen is unremarkable.

Adrenals/Urinary Tract: Right adrenal adenoma measures 1.2 cm stable
in size in appearance from prior exam. Left adrenal adenoma measures
1.4 cm stable in size in appearance from prior exam.

No hydronephrosis or hydroureter. There is nonobstructive calcified
calculus in right renal pelvis measures 1.3 cm. Nonobstructive
calculus in midpole of the left kidney measures 3 mm. Bilateral no
calcified ureteral calculi are noted.

No calcified calculi are noted within urinary bladder.

Stomach/Bowel: No gastric outlet obstruction. No small bowel
obstruction. No thickened or dilated small bowel loops. The terminal
ileum is unremarkable.

The patient is status post appendectomy. No pericecal inflammation.
Few diverticula are noted sigmoid colon. No evidence of acute
diverticulitis.

No evidence of acute colitis.

Vascular/Lymphatic: No aortic aneurysm. Mild atherosclerotic
calcifications of abdominal aorta and iliac arteries. No mesenteric
or retroperitoneal adenopathy. No inguinal adenopathy.

Reproductive: Prostate gland and seminal vesicles are unremarkable.

Other: No ascites or free air.

Musculoskeletal: Sagittal images of the spine shows mild disc space
flattening with anterior spurring at L5-S1 level. Mild degenerative
changes lower thoracic spine.
IMPRESSION: 1. There is bilateral nonobstructive nephrolithiasis. Nonobstructive
calculus in right renal pelvis measures 1.3 cm. Nonobstructive
calculus in midpole of the left kidney measures 3 mm.
2. No calcified ureteral calculi.  No hydronephrosis or hydroureter.
3. No pericecal inflammation.  Status post appendectomy.
4. No calcified calculi are noted within urinary bladder.
5. Stable bilateral probable adrenal adenoma.
6. No small bowel obstruction.
7. Few colonic diverticula are noted proximal sigmoid colon. No
evidence of acute diverticulitis.

## 2016-01-17 MED ORDER — SENNOSIDES-DOCUSATE SODIUM 8.6-50 MG PO TABS: 1.0000 | ORAL_TABLET | Freq: Every evening | ORAL | Status: DC | PRN

## 2016-01-17 MED ORDER — ASPIRIN 81 MG PO CHEW
324.0000 mg | CHEWABLE_TABLET | Freq: Once | ORAL | Status: DC
  Filled 2016-01-17: qty 4

## 2016-01-17 MED ORDER — DEXTROSE 5 % IV SOLN
1.0000 g | INTRAVENOUS | Status: DC
  Administered 2016-01-17: 1 g via INTRAVENOUS
  Filled 2016-01-17: qty 10

## 2016-01-17 MED ORDER — ACETAMINOPHEN 650 MG RE SUPP: 650.0000 mg | Freq: Four times a day (QID) | RECTAL | Status: DC | PRN

## 2016-01-17 MED ORDER — ENOXAPARIN SODIUM 40 MG/0.4ML ~~LOC~~ SOLN
40.0000 mg | SUBCUTANEOUS | Status: DC
Start: 2016-01-17 — End: 2016-01-18
  Administered 2016-01-17: 40 mg via SUBCUTANEOUS
  Filled 2016-01-17: qty 0.4

## 2016-01-17 MED ORDER — PNEUMOCOCCAL VAC POLYVALENT 25 MCG/0.5ML IJ INJ
0.5000 mL | INJECTION | INTRAMUSCULAR | Status: AC
  Administered 2016-01-18: 0.5 mL via INTRAMUSCULAR
  Filled 2016-01-17: qty 0.5
  Filled 2016-01-17: qty 1

## 2016-01-17 MED ORDER — ACETAMINOPHEN 325 MG PO TABS
650.0000 mg | ORAL_TABLET | Freq: Four times a day (QID) | ORAL | Status: DC | PRN
  Administered 2016-01-18: 650 mg via ORAL
  Filled 2016-01-17: qty 2

## 2016-01-17 MED ORDER — SODIUM CHLORIDE 0.9% FLUSH
3.0000 mL | Freq: Two times a day (BID) | INTRAVENOUS | Status: DC
  Administered 2016-01-17 (×2): 3 mL via INTRAVENOUS

## 2016-01-17 MED ORDER — INFLUENZA VAC SPLIT QUAD 0.5 ML IM SUSY
0.5000 mL | PREFILLED_SYRINGE | INTRAMUSCULAR | Status: AC
  Administered 2016-01-18: 0.5 mL via INTRAMUSCULAR

## 2016-01-17 MED ORDER — ACETAMINOPHEN 325 MG PO TABS
650.0000 mg | ORAL_TABLET | Freq: Once | ORAL | Status: AC
  Administered 2016-01-17: 650 mg via ORAL
  Filled 2016-01-17: qty 2

## 2016-01-17 MED ORDER — GI COCKTAIL ~~LOC~~
30.0000 mL | Freq: Once | ORAL | Status: AC
  Administered 2016-01-17: 30 mL via ORAL
  Filled 2016-01-17: qty 30

## 2016-01-17 MED ORDER — SULFAMETHOXAZOLE-TRIMETHOPRIM 800-160 MG PO TABS
1.0000 | ORAL_TABLET | Freq: Two times a day (BID) | ORAL | Status: DC
  Administered 2016-01-18: 1 via ORAL
  Filled 2016-01-17 (×3): qty 1

## 2016-01-17 MED ORDER — ONDANSETRON HCL 4 MG PO TABS: 4.0000 mg | ORAL_TABLET | Freq: Four times a day (QID) | ORAL | Status: DC | PRN

## 2016-01-17 MED ORDER — ASPIRIN 81 MG PO CHEW
81.0000 mg | CHEWABLE_TABLET | Freq: Every day | ORAL | Status: DC
  Administered 2016-01-18: 81 mg via ORAL
  Filled 2016-01-17: qty 1

## 2016-01-17 MED ORDER — DEXTROSE 5 % IV SOLN: 1.0000 g | INTRAVENOUS | Status: DC

## 2016-01-17 MED ORDER — ONDANSETRON HCL 4 MG/2ML IJ SOLN: 4.0000 mg | Freq: Four times a day (QID) | INTRAMUSCULAR | Status: DC | PRN

## 2016-01-17 NOTE — ED Notes (Signed)
Admitting at bedside 

## 2016-01-17 NOTE — ED Notes (Signed)
Attempted report 

## 2016-01-17 NOTE — Consult Note (Signed)
Cardiologist:  Nahser Reason for Consult: Chest Pain Referring Physician:   [name] is an 50 y.o. male.  HPI:  Is a 50 year old male, who has a normal weight, with history of coronary artery disease ) MI in 2005. He underwent cardiac catheterization revealing a 75-80% ulcerative plaque in the mid LAD. He underwent successful PCI with stent placement. He has a history of kidney stones and had lithotripsy 2006.  He reports having a large kidney stone still in the right side. CT abd/pel in 2012 revealed 5 mm calculus in the proximal/mid right ureter with moderate right hydronephrosis. Additional 4 mm nonobstructing right upper pole renal calculus.  He reports having a stroke two years ago and never seeking treatment.  At that time he said he passed out and was incontinent of urine and stool.  He had a CT angio head and neck in 2015 which was unremarkable.    Patient reports that last night at 2100 hrs. he developed a constant hurt his chest. Was 4-5 out of 10 intensity at the max. 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 He is currently pain-free. He denies shortness of breath. Chest pain as well as nausea, vomiting, diaphoresis. No 500 hours this morning he said he had a syncopal episode. He says he was nauseated prior. Also reports he felt something moving in the left side of his neck and stop is below his ear. He is struggling with hematuria for about 4 weeks now. He gets nauseated sequelae works with his head bent over for a long period of time. Quit smoking 2 weeks ago but has been smoking 3 packs per day before that.. He also drinks 2 L bottle of Coke every day.     Past Medical History  Diagnosis Date  . Heart attack ([place]) 11 YRS AGO  . Kidney stone     Past Surgical History  Procedure Laterality Date  . Appendectomy    . Hernia repair    . Coronary  angioplasty with stent placement      No family history on file.  Social History:  reports that he has never smoked. He does not have any smokeless tobacco history on file. He reports that he drinks alcohol. His drug history is not on file.  Allergies:  Allergies  Allergen Reactions  . Bee Venom Swelling  . Penicillins Other (See Comments)    Has patient had a PCN reaction causing immediate rash, facial/tongue/throat swelling, SOB or lightheadedness with hypotension: No Has patient had a PCN reaction causing severe rash involving mucus membranes or skin necrosis: No Has patient had a PCN reaction that required hospitalization No Has patient had a PCN reaction occurring within the last 10 years: No If all of the above answers are "NO", then may proceed with Cephalosporin use.    Medications: Medication Sig  Ascorbic Acid (VITAMIN C PO) Take 1 tablet by mouth daily.  aspirin 81 MG chewable tablet Chew 486-567 mg by mouth daily.   Cholecalciferol (VITAMIN D PO) Take 1 tablet by mouth daily.  Cyanocobalamin (VITAMIN B-12 PO) Take 1 tablet by mouth daily.  Omega-3 Fatty Acids (FISH OIL) 1000 MG CAPS Take 3,000 mg by mouth daily.  VITAMIN E PO Take 1 tablet by mouth daily.     Results for orders placed or performed during the hospital encounter of 01/17/16 (from  the past 48 hour(s))  Basic metabolic panel     Status: Abnormal   Collection Time: 01/17/16  7:59 AM  Result Value Ref Range   Sodium 138 135 - 145 mmol/L   Potassium 3.8 3.5 - 5.1 mmol/L   Chloride 104 101 - 111 mmol/L   CO2 23 22 - 32 mmol/L   Glucose, Bld 121 (H) 65 - 99 mg/dL   BUN 21 (H) 6 - 20 mg/dL   Creatinine, Ser 0.93 0.61 - 1.24 mg/dL   Calcium 8.8 (L) 8.9 - 10.3 mg/dL   GFR calc non Af Amer >60 >60 mL/min   GFR calc Af Amer >60 >60 mL/min    Comment: (NOTE) The eGFR has been calculated using the CKD EPI equation. This calculation has not been validated in all clinical situations. eGFR's persistently <60  mL/min signify possible Chronic Kidney Disease.    Anion gap 11 5 - 15  CBC     Status: None   Collection Time: 01/17/16  7:59 AM  Result Value Ref Range   WBC 5.7 4.0 - 10.5 K/uL   RBC 4.71 4.22 - 5.81 MIL/uL   Hemoglobin 14.1 13.0 - 17.0 g/dL   HCT 42.5 39.0 - 52.0 %   MCV 90.2 78.0 - 100.0 fL   MCH 29.9 26.0 - 34.0 pg   MCHC 33.2 30.0 - 36.0 g/dL   RDW 13.3 11.5 - 15.5 %   Platelets 164 150 - 400 K/uL  I-stat troponin, ED (not at [facility])     Status: None   Collection Time: 01/17/16  8:43 AM  Result Value Ref Range   Troponin i, poc 0.00 0.00 - 0.08 ng/mL   Comment 3            Comment: Due to the release kinetics of cTnI, a negative result within the first hours of the onset of symptoms does not rule out myocardial infarction with certainty. If myocardial infarction is still suspected, repeat the test at appropriate intervals.   Urinalysis, Routine w reflex microscopic (not at [facility])     Status: Abnormal   Collection Time: 01/17/16 10:43 AM  Result Value Ref Range   Color, Urine RED (A) YELLOW    Comment: BIOCHEMICALS MAY BE AFFECTED BY COLOR   APPearance TURBID (A) CLEAR   Specific Gravity, Urine 1.029 1.005 - 1.030   pH 5.0 5.0 - 8.0   Glucose, UA NEGATIVE NEGATIVE mg/dL   Hgb urine dipstick LARGE (A) NEGATIVE   Bilirubin Urine LARGE (A) NEGATIVE   Ketones, ur 15 (A) NEGATIVE mg/dL   Protein, ur >300 (A) NEGATIVE mg/dL   Nitrite POSITIVE (A) NEGATIVE   Leukocytes, UA MODERATE (A) NEGATIVE  Urine microscopic-add on     Status: Abnormal   Collection Time: 01/17/16 10:43 AM  Result Value Ref Range   Squamous Epithelial / LPF 0-5 (A) NONE SEEN   WBC, UA 6-30 0 - 5 WBC/hpf   RBC / HPF TOO NUMEROUS TO COUNT 0 - 5 RBC/hpf   Bacteria, UA FEW (A) NONE SEEN  I-Stat Troponin, ED (not at [facility])     Status: None   Collection Time: 01/17/16 11:28 AM  Result Value Ref Range   Troponin i, poc 0.00 0.00 - 0.08 ng/mL   Comment 3            Comment: Due to the release  kinetics of cTnI, a negative result within the first hours of the onset of symptoms does not rule out myocardial  infarction with certainty. If myocardial infarction is still suspected, repeat the test at appropriate intervals.     Dg Chest 2 View  01/17/2016  CLINICAL DATA:  50 year old male with chest pain radiating to the left neck since last night. Initial encounter. EXAM: CHEST  2 VIEW COMPARISON:  CT Abdomen and Pelvis 06/01/2011. FINDINGS: Normal lung volumes. Normal cardiac size and mediastinal contours. Visualized tracheal air column is within normal limits. No pneumothorax, pulmonary edema, pleural effusion or confluent pulmonary opacity. No osseous abnormality identified. IMPRESSION: Negative, no acute cardiopulmonary abnormality. Electronically Signed   By: Genevie Ann M.D.   On: 01/17/2016 08:32    Review of Systems  Constitutional: Positive for diaphoresis. Negative for fever.  HENT: Negative for congestion and sore throat.   Respiratory: Negative for cough and shortness of breath.   Cardiovascular: Positive for chest pain. Negative for orthopnea, leg swelling and PND.  Gastrointestinal: Positive for nausea and abdominal pain. Negative for vomiting and blood in stool.  Genitourinary: Positive for hematuria. Negative for dysuria.  Musculoskeletal: Positive for neck pain.  Neurological: Positive for loss of consciousness.   Blood pressure 120/82, pulse 63, temperature 98.8 F (37.1 C), temperature source Oral, resp. rate 11, height _0  (1.778 m), weight 174 lb (78.926 kg), SpO2 99 %. Physical Exam  Nursing note and vitals reviewed. Constitutional: He is oriented to person, place, and time. He appears well-developed and well-nourished. No distress.  HENT:  Head: Normocephalic and atraumatic.  Mouth/Throat: No oropharyngeal exudate.  Eyes: EOM are normal. Pupils are equal, round, and reactive to light. No scleral icterus.  Neck: Normal range of motion. Neck supple. No JVD  present.  Cardiovascular: Regular rhythm and S1 normal.   No murmur heard. Pulses:      Radial pulses are 2+ on the right side, and 2+ on the left side.       Dorsalis pedis pulses are 2+ on the right side, and 2+ on the left side.  No carotid bruit  Respiratory: Effort normal and breath sounds normal. No respiratory distress. He has no wheezes. He has no rales.  GI: Soft. Bowel sounds are normal. He exhibits no distension. There is no tenderness.  Musculoskeletal: He exhibits no edema or tenderness.  Mild posterior tenderness along the upper trapezius on the right side  Lymphadenopathy:    He has no cervical adenopathy.  Neurological: He is alert and oriented to person, place, and time. No cranial nerve deficit. He exhibits normal muscle tone.  Skin: Skin is warm and dry.  Psychiatric: He has a normal mood and affect.    Assessment/Plan: Active Problems:   Chest pain   Hematuria   Syncope   Neck pain on right side   History of kidney stones   Tobacco abuse  50 year old male with history of coronary artery disease ) MI in 2005. He underwent cardiac catheterization revealing a 75-80% ulcerative plaque in the mid LAD. He underwent successful PCI with stent placement.He presents with chest pain, hematuria, syncope, right-sided neck pain.  Troponin POC is negative 2. EKG shows T-wave inversions inferiorly no prior EKG to compare. He did very exertional work yesterday, pulling a well pump 200 feet without developing chest pain.  He continues to use tobacco and eats a poor diet.  He says he quit smoking 2 weeks ago.  Chest x-ray showed nothing acute. No signs of congestive heart failure suggesting decreased EF.  Recommend admitting to telemetry to monitor his heart rhythm in case he had some  type of arrhythmia causing him to pass out. He has no carotid bruits and CT angina though 3 years ago showed no stenosis.  Vasovagal?  Right coronary artery was normal back in 2005 however, that is where  his T-wave inversions are currently and that was 12 years ago. Continue cycle troponin.  If it remains negative, do a stress test tomorrow.   He does have gross hematuria and is nitrite positive. Will likely need antibiotic.  Will let internal medicine decide.  CT abdomen and pelvis pending.    Leon Allen, [place] 01/17/2016, 1:17 PM     Personally seen and examined. Agree with above. Strenuous activity yesterday, prior LAD stent, TWI inferior. Neck pain posterior, hematuria. Near syncope, ? Vagal.   Observe. NUC stress test in AM. He requested cath but since troponin is normal and fairly nonspecific ECG findings, would advocate stress test.   RRR, CTAB, soft abd.   ?UTI.   Candee Furbish, MD

## 2016-01-17 NOTE — ED Notes (Addendum)
Patient reports that he got very angry and felt severe left sided neck pain. Patient wants his carotid checked "i think i have a blockage". No distress. Also complains of generalized body aches and continues to talk about hx of kidney stones. Now complains of chest pain that kept his awake all night

## 2016-01-17 NOTE — ED Notes (Signed)
Cardiology at bedside.

## 2016-01-17 NOTE — Progress Notes (Signed)
Orthostatics negative.

## 2016-01-17 NOTE — ED Provider Notes (Signed)
CSN: [id number]     Arrival date & time 01/17/16  [id number] History   First MD Initiated Contact with Patient 01/17/16 [phone]     Chief Complaint  Patient presents with  . neck pain      (Consider location/radiation/quality/duration/timing/severity/associated sxs/prior Treatment) HPI Leon Allen is a 50 y.o. male with history of MI 12 years ago with stenting, history of kidney stone, reported history of CVA, however I am unable to find documentation for this, presents to emergency department complaining of left-sided chest pain, left-sided neck pain, syncopal episode, blood in his urine. Patient reports that several months ago he developed left-sided neck pain that has improved since but states "it feels like I have a clot in my artery or something." He states pain comes and goes, worse with movement. He denies any blurred vision or dizziness. He states he gets intermittent diaphoresis, states "just like when I had my heart attack." He also reports intermittent tightness sensation in his throat. He reports exertional weakness, however denies exertional chest pain or shortness of breath. He states that this chest pain started in the middle the night and kept him up all night. He states this morning he did not feel good and reports that he "fell out." Sounds like patient had a syncopal episode after becoming dizzy. Patient has multiple other medical complaints. He is also complaining of intermittent right-sided flank pain and hematuria. He denies any pain at this time however. He is concerned about possible kidney stone that he may not have passed, also states "I want to be checked for prostate cancer." He reports family history of prostate cancer and states that "I have all the symptoms that I read on the Internet."  Past Medical History  Diagnosis Date  . Heart attack ([place]) 11 YRS AGO  . Kidney stone    Past Surgical History  Procedure Laterality Date  . Appendectomy    . Hernia repair    .  Coronary angioplasty with stent placement     No family history on file. Social History  Substance Use Topics  . Smoking status: Never Smoker   . Smokeless tobacco: None  . Alcohol Use: Yes    Review of Systems  Constitutional: Positive for diaphoresis and fatigue. Negative for fever and chills.  Respiratory: Positive for chest tightness. Negative for cough and shortness of breath.   Cardiovascular: Positive for chest pain. Negative for palpitations and leg swelling.  Gastrointestinal: Negative for nausea, vomiting, abdominal pain, diarrhea and abdominal distention.  Genitourinary: Positive for hematuria and flank pain. Negative for dysuria, urgency and frequency.  Musculoskeletal: Positive for neck pain. Negative for myalgias, arthralgias and neck stiffness.  Skin: Negative for rash.  Allergic/Immunologic: Negative for immunocompromised state.  Neurological: Positive for weakness. Negative for dizziness, light-headedness, numbness and headaches.      Allergies  Bee venom and Penicillins  Home Medications   Prior to Admission medications   Medication Sig Start Date End Date Taking? Authorizing Provider  Ascorbic Acid (VITAMIN C PO) Take 1 tablet by mouth daily.    Historical Provider, MD  aspirin 81 MG chewable tablet Chew 486-567 mg by mouth daily.     Historical Provider, MD  Cholecalciferol (VITAMIN D PO) Take 1 tablet by mouth daily.    Historical Provider, MD  Cyanocobalamin (VITAMIN B-12 PO) Take 1 tablet by mouth daily.    Historical Provider, MD  Omega-3 Fatty Acids (FISH OIL) 1000 MG CAPS Take 3,000 mg by mouth daily.  Historical Provider, MD  VITAMIN E PO Take 1 tablet by mouth daily.    Historical Provider, MD   BP 123/76 mmHg  Pulse 83  Temp(Src) 98.8 F (37.1 C) (Oral)  Resp 16  Ht 5\' 10"  (1.778 m)  Wt 78.926 kg  BMI 24.97 kg/m2  SpO2 97% Physical Exam  Constitutional: He is oriented to person, place, and time. He appears well-developed and  well-nourished. No distress.  HENT:  Head: Normocephalic and atraumatic.  Eyes: Conjunctivae and EOM are normal. Pupils are equal, round, and reactive to light.  Neck: Neck supple.  No ttp over the neck. No bruits  Cardiovascular: Normal rate, regular rhythm and normal heart sounds.   Pulmonary/Chest: Effort normal. No respiratory distress. He has no wheezes. He has no rales.  Abdominal: Soft. Bowel sounds are normal. He exhibits no distension. There is no tenderness. There is no rebound.  Musculoskeletal: He exhibits no edema.  Neurological: He is alert and oriented to person, place, and time.  Skin: Skin is warm and dry.  Nursing note and vitals reviewed.   ED Course  Procedures (including critical care time) Labs Review Labs Reviewed  BASIC METABOLIC PANEL - Abnormal; Notable for the following:    Glucose, Bld 121 (*)    BUN 21 (*)    Calcium 8.8 (*)    All other components within normal limits  URINALYSIS, ROUTINE W REFLEX MICROSCOPIC (NOT AT [facility]) - Abnormal; Notable for the following:    Color, Urine RED (*)    APPearance TURBID (*)    Hgb urine dipstick LARGE (*)    Bilirubin Urine LARGE (*)    Ketones, ur 15 (*)    Protein, ur >300 (*)    Nitrite POSITIVE (*)    Leukocytes, UA MODERATE (*)    All other components within normal limits  URINE MICROSCOPIC-ADD ON - Abnormal; Notable for the following:    Squamous Epithelial / LPF 0-5 (*)    Bacteria, UA FEW (*)    All other components within normal limits  URINE CULTURE  CBC  I-STAT TROPOININ, ED  I-STAT TROPOININ, ED  CYTOLOGY - NON PAP    Imaging Review Dg Chest 2 View  01/17/2016  CLINICAL DATA:  50 year old male with chest pain radiating to the left neck since last night. Initial encounter. EXAM: CHEST  2 VIEW COMPARISON:  CT Abdomen and Pelvis 06/01/2011. FINDINGS: Normal lung volumes. Normal cardiac size and mediastinal contours. Visualized tracheal air column is within normal limits. No pneumothorax,  pulmonary edema, pleural effusion or confluent pulmonary opacity. No osseous abnormality identified. IMPRESSION: Negative, no acute cardiopulmonary abnormality. Electronically Signed   By: Genevie Ann M.D.   On: 01/17/2016 08:32   I have personally reviewed and evaluated these images and lab results as part of my medical decision-making.   EKG Interpretation   Date/Time:  Thursday January 17 2016 07:56:01 EDT Ventricular Rate:  77 PR Interval:  134 QRS Duration: 86 QT Interval:  358 QTC Calculation: 405 R Axis:   49 Text Interpretation:  Normal sinus rhythm Normal ECG st elevation resolved  from prior image in inferior leads Confirmed by Maryan Rued  MD, Loree Fee  [phone]) on 01/17/2016 9:37:24 AM      MDM   Final diagnoses:  Unstable angina (HCC)  Syncope, unspecified syncope type  Hematuria  Neck pain on left side    patient with multiple complaints, switches from one subject to the next during the history. The most concerning thing about patient's presentation  today is the chest pain, exertional weakness, intermittent diaphoresis, and syncopal episode that occurred this morning. Patient states "I passed out 3 times right before I had my last heart attack." Aspirin ordered. Will also try GI cocktail. EKG today with no acute changes. Patient did have a STEMI 12 years ago and has not followed up since then. Will consult cardiology.  1:56 PM Cardiology has seen the patient. They will do stress test and cycle enzymes overnight. Asked for medicine to admit. I have also performed a CT scan for further evaluation of patient's hematuria and possible UTI. CT scan shows no  ureteral obstruction, patient has persistent bilateral renal stones. Urine culture and antibiotics for infection ordered. Second troponin resulted and is negative. Will call unassigned medicine for admission.   2:52 PM Discussed with teaching service, will admit.   Filed Vitals:   01/17/16 1100 01/17/16 1115 01/17/16 1130  01/17/16 1300  BP: 124/89 120/87 120/81 120/82  Pulse: 61 64 61 63  Temp:      TempSrc:      Resp: 14 19 14 11   Height:      Weight:      SpO2: 99% 99% 99% 99%     Jeannett Senior, PA-C 01/17/16 1452  Blanchie Dessert, MD 01/17/16 1633

## 2016-01-17 NOTE — H&P (Signed)
Date: 01/17/2016               Patient Name:  Leon Allen MRN: [id number]  DOB: December 24, 1965 Age / Sex: 50 y.o., male   PCP: No primary care provider on file.         Medical Service: Internal Medicine Teaching Service         Attending Physician: Dr. Annia Belt, MD    First Contact: Dr. Zada Finders Pager: [phone]  Second Contact: Dr. Charlott Rakes Pager: [phone]       After Hours (After 5p/  First Contact Pager: [phone]  weekends / holidays): Second Contact Pager: [phone]   Chief Complaint: Syncope  History of Present Illness:  Mr. Leon Allen is a 50 year old male with PMH of MI in 2005 s/p PCI w/ stent placement (75-80% mid LAD), reported CVA 2 years ago (CTA head/neck 2015 unremarkable), and kidney stones (lithotripsy 2006) who presents with syncope, neck pain, and chest pain. He says his chest pain began last night all of a sudden. It was located at the left chest wall and beneath the left rib cage. He says it felt like a "toothache." He says this pain feels a little different than the pain he experienced during his heart attack. He says he has neck and jaw pain as well which has been going on for several weeks. He felt nauseous early this morning and went to the bathroom as he thought he was going to vomit. He says he felt hot and sweaty then passed out. He thinks he hit something when he fell. He reports occasional lightheadedness and tingling. He was put on Plavix after his heart attack, but stopped taking this due to insurance issues. Instead, he takes 5-6 baby aspirin per day to "thin his blood."   He also reports hematuria for the last 4 weeks. He works as a Development worker, community and while at work had lower back pain on both sides which felt similar to his prior kidney stone pain. He has occasionally noticed the blood in his urine since then which occurs throughout the urinary stream. He denies any dysuria. He says he drinks a 2 L bottle of coke daily and does not drink much  water. He says he takes 5-6 Ibuprofen at once for headaches he has experienced for the last few weeks.  He reports family history and several friends with cancer. He is concerned he may also have cancer. He reports seeing a commercial for Latuda and that he has all the side-effects mentioned. He is also requesting a catheterization instead of a stress test.  Social: Former smoker 3 PPD until his MI. Occasionally tries a cigarette, which exacerbates his neck pain. Former marijuana and cocaine use prior to his MI. Reports 1-2 alcoholic drinks (miller lite or mixed drink) per week, sometimes goes several months without drinking.  Family Hx: Mother died age 94 of Hodgkin's, Sister with breast cancer, cousins with lung cancer  In the ED, vitals were: BP 123/76 mmHg  Pulse 83  Temp(Src) 98.8 F (37.1 C) (Oral)  Resp 16  Ht 5\' 10"  (1.778 m)  Wt 78.926 kg  BMI 24.97 kg/m2  SpO2 97%  I-stat troponins were negative x2. EKG was without acute ischemic changes. He has t-wave inversions which are reportedly seen on prior EKGs (cannot view prior on my end). Urine was positive for nitritis, leukocytes, pyuria, and numerous RBCs.  CT renal stone study in the ED shows bilateral nonobstructive nephrolithiasis. No calcified  ureteral calculi, hydronephrosis, or hydroureter. No calcified calculi in the urinary bladder. Patient started on IV Ceftriaxone.    Meds: Current Facility-Administered Medications  Medication Dose Route Frequency Provider Last Rate Last Dose  . acetaminophen (TYLENOL) tablet 650 mg  650 mg Oral Q6H PRN Juluis Mire, MD       Or  . acetaminophen (TYLENOL) suppository 650 mg  650 mg Rectal Q6H PRN Marjan Rabbani, MD      . aspirin chewable tablet 324 mg  324 mg Oral Once Tatyana Kirichenko, PA-C   324 mg at 01/17/16 0951  . [START ON 01/18/2016] aspirin chewable tablet 81 mg  81 mg Oral Daily Marjan Rabbani, MD      . enoxaparin (LOVENOX) injection 40 mg  40 mg Subcutaneous Q24H  Marjan Rabbani, MD      . ondansetron (ZOFRAN) tablet 4 mg  4 mg Oral Q6H PRN Marjan Rabbani, MD       Or  . ondansetron (ZOFRAN) injection 4 mg  4 mg Intravenous Q6H PRN Marjan Rabbani, MD      . senna-docusate (Senokot-S) tablet 1 tablet  1 tablet Oral QHS PRN Marjan Rabbani, MD      . sodium chloride flush (NS) 0.9 % injection 3 mL  3 mL Intravenous Q12H Marjan Rabbani, MD   3 mL at 01/17/16 1515  . [START ON 01/18/2016] sulfamethoxazole-trimethoprim (BACTRIM DS,SEPTRA DS) 800-160 MG per tablet 1 tablet  1 tablet Oral Q12H Juluis Mire, MD        Allergies: Allergies as of 01/17/2016 - Review Complete 01/17/2016  Allergen Reaction Noted  . Bee venom Swelling 06/14/2014  . Penicillins Other (See Comments) 05/03/2012   Past Medical History  Diagnosis Date  . Heart attack ([place]) 11 YRS AGO  . Kidney stone   . CVA (cerebral infarction)   . Adrenal adenoma     bilateral  . Hematuria    Past Surgical History  Procedure Laterality Date  . Appendectomy    . Hernia repair    . Coronary angioplasty with stent placement     Family History  Problem Relation Age of Onset  . Cancer Mother    Social History   Social History  . Marital Status: Divorced    Spouse Name: N/A  . Number of Children: N/A  . Years of Education: N/A   Occupational History  . Not on file.   Social History Main Topics  . Smoking status: Former Smoker -- 3.00 packs/day    Types: Cigarettes  . Smokeless tobacco: Not on file     Comment: quit when had MI   . Alcohol Use: 0.0 oz/week    0 Standard drinks or equivalent per week  . Drug Use: Not on file  . Sexual Activity: Not on file   Other Topics Concern  . Not on file   Social History Narrative    Review of Systems: Review of Systems  Constitutional: Positive for fever, malaise/fatigue and diaphoresis. Negative for chills.  HENT: Positive for ear pain.   Eyes: Positive for blurred vision.  Respiratory: Positive for cough and shortness of  breath. Negative for hemoptysis, sputum production and wheezing.   Cardiovascular: Positive for chest pain. Negative for palpitations and leg swelling.  Gastrointestinal: Positive for nausea, abdominal pain, diarrhea and constipation. Negative for heartburn, vomiting, blood in stool and melena.  Genitourinary: Positive for hematuria and flank pain. Negative for dysuria.  Musculoskeletal: Positive for myalgias, back pain, joint pain and falls.  Neurological: Positive for  dizziness, speech change, loss of consciousness, weakness and headaches. Negative for tingling, sensory change and focal weakness.  Psychiatric/Behavioral: Negative for substance abuse.     Physical Exam: Blood pressure 133/88, pulse 67, temperature 98.9 F (37.2 C), temperature source Oral, resp. rate 11, height 5\' 10"  (1.778 m), weight 175 lb 11.2 oz (79.697 kg), SpO2 96 %. Physical Exam  Constitutional: He is oriented to person, place, and time. He appears well-developed and well-nourished. No distress.  HENT:  Head: Normocephalic and atraumatic.  Mouth/Throat: Oropharynx is clear and moist.  Eyes: EOM are normal. Pupils are equal, round, and reactive to light.  Neck: Normal range of motion. Neck supple. Carotid bruit is not present. No tracheal deviation present. No thyromegaly present.  Cardiovascular: Normal rate, regular rhythm and intact distal pulses.   No murmur heard. Pulmonary/Chest: Effort normal and breath sounds normal. No respiratory distress. He has no wheezes. He has no rales. He exhibits no tenderness.  Abdominal: Soft. Bowel sounds are normal. He exhibits no distension. There is no tenderness. There is no CVA tenderness.  Musculoskeletal: Normal range of motion. He exhibits no edema or tenderness.  Lymphadenopathy:    He has no cervical adenopathy.  Neurological: He is alert and oriented to person, place, and time.  Skin: Skin is warm. He is not diaphoretic.     Lab results: Basic Metabolic  Panel:  Recent Labs  01/17/16 0759  NA 138  K 3.8  CL 104  CO2 23  GLUCOSE 121*  BUN 21*  CREATININE 0.93  CALCIUM 8.8*   Liver Function Tests: No results for input(s): AST, ALT, ALKPHOS, BILITOT, PROT, ALBUMIN in the last 72 hours. No results for input(s): LIPASE, AMYLASE in the last 72 hours. No results for input(s): AMMONIA in the last 72 hours. CBC:  Recent Labs  01/17/16 0759  WBC 5.7  HGB 14.1  HCT 42.5  MCV 90.2  PLT 164   Cardiac Enzymes: No results for input(s): CKTOTAL, CKMB, CKMBINDEX, TROPONINI in the last 72 hours. BNP: No results for input(s): PROBNP in the last 72 hours. D-Dimer: No results for input(s): DDIMER in the last 72 hours. CBG: No results for input(s): GLUCAP in the last 72 hours. Hemoglobin A1C: No results for input(s): HGBA1C in the last 72 hours. Fasting Lipid Panel: No results for input(s): CHOL, HDL, LDLCALC, TRIG, CHOLHDL, LDLDIRECT in the last 72 hours. Thyroid Function Tests: No results for input(s): TSH, T4TOTAL, FREET4, T3FREE, THYROIDAB in the last 72 hours. Anemia Panel: No results for input(s): VITAMINB12, FOLATE, FERRITIN, TIBC, IRON, RETICCTPCT in the last 72 hours. Coagulation: No results for input(s): LABPROT, INR in the last 72 hours. Urine Drug Screen: Drugs of Abuse  No results found for: LABOPIA, COCAINSCRNUR, LABBENZ, AMPHETMU, THCU, LABBARB  Alcohol Level: No results for input(s): ETH in the last 72 hours. Urinalysis:  Recent Labs  01/17/16 1043  COLORURINE RED*  LABSPEC 1.029  PHURINE 5.0  GLUCOSEU NEGATIVE  HGBUR LARGE*  BILIRUBINUR LARGE*  KETONESUR 15*  PROTEINUR >300*  NITRITE POSITIVE*  LEUKOCYTESUR MODERATE*     Imaging results:  Dg Chest 2 View  01/17/2016  CLINICAL DATA:  50 year old male with chest pain radiating to the left neck since last night. Initial encounter. EXAM: CHEST  2 VIEW COMPARISON:  CT Abdomen and Pelvis 06/01/2011. FINDINGS: Normal lung volumes. Normal cardiac size and  mediastinal contours. Visualized tracheal air column is within normal limits. No pneumothorax, pulmonary edema, pleural effusion or confluent pulmonary opacity. No osseous abnormality identified. IMPRESSION:  Negative, no acute cardiopulmonary abnormality. Electronically Signed   By: Genevie Ann M.D.   On: 01/17/2016 08:32   Ct Renal Stone Study  01/17/2016  CLINICAL DATA:  Intermittent right flank pain for 1 month, hematuria EXAM: CT ABDOMEN AND PELVIS WITHOUT CONTRAST TECHNIQUE: Multidetector CT imaging of the abdomen and pelvis was performed following the standard protocol without IV contrast. COMPARISON:  CT scan 06/01/2011 FINDINGS: Lower chest: Axial image 7 subpleural nodule in left lower lobe again noted measures 7 mm stable in size in appearance from prior exam. Second subpleural nodule in left lower lobe measures 6 mm stable in size in appearance from prior exam. No new pulmonary nodules are noted in visualized lung bases. Hepatobiliary: Unenhanced liver shows no biliary ductal dilatation. Multiple gallstones are noted within gallbladder the largest measures 7.8 mm. No CBD dilatation. Pancreas: Unenhanced pancreas is unremarkable. Spleen: Unenhanced spleen is unremarkable. Adrenals/Urinary Tract: Right adrenal adenoma measures 1.2 cm stable in size in appearance from prior exam. Left adrenal adenoma measures 1.4 cm stable in size in appearance from prior exam. No hydronephrosis or hydroureter. There is nonobstructive calcified calculus in right renal pelvis measures 1.3 cm. Nonobstructive calculus in midpole of the left kidney measures 3 mm. Bilateral no calcified ureteral calculi are noted. No calcified calculi are noted within urinary bladder. Stomach/Bowel: No gastric outlet obstruction. No small bowel obstruction. No thickened or dilated small bowel loops. The terminal ileum is unremarkable. The patient is status post appendectomy. No pericecal inflammation. Few diverticula are noted sigmoid colon. No  evidence of acute diverticulitis. No evidence of acute colitis. Vascular/Lymphatic: No aortic aneurysm. Mild atherosclerotic calcifications of abdominal aorta and iliac arteries. No mesenteric or retroperitoneal adenopathy. No inguinal adenopathy. Reproductive: Prostate gland and seminal vesicles are unremarkable. Other: No ascites or free air. Musculoskeletal: Sagittal images of the spine shows mild disc space flattening with anterior spurring at L5-S1 level. Mild degenerative changes lower thoracic spine. IMPRESSION: 1. There is bilateral nonobstructive nephrolithiasis. Nonobstructive calculus in right renal pelvis measures 1.3 cm. Nonobstructive calculus in midpole of the left kidney measures 3 mm. 2. No calcified ureteral calculi.  No hydronephrosis or hydroureter. 3. No pericecal inflammation.  Status post appendectomy. 4. No calcified calculi are noted within urinary bladder. 5. Stable bilateral probable adrenal adenoma. 6. No small bowel obstruction. 7. Few colonic diverticula are noted proximal sigmoid colon. No evidence of acute diverticulitis. Electronically Signed   By: Lahoma Crocker M.D.   On: 01/17/2016 13:34    Other results: EKG: sinus rhythm, t-wave inversions III, aVF  Assessment & Plan by Problem: Principal Problem:   Syncope Active Problems:   Chest pain   Hematuria   Neck pain on right side   Nephrolithiasis   History of tobacco abuse   Adrenal adenoma   History of CVA (cerebrovascular accident)   Cholelithiasis   Pulmonary nodules  Syncope: Patient with syncopal episode last night preceded by left sided chest pain, diaphoresis, nausea, and hot flash. Chest pain has now resolved. Patient says pain was not as bad as prior MI, or bad enough to take a nitro pill. Troponins are negative x 2 and EKG without acute ischemic changes, so less likely ACS. Patient denies any palpitations and EKG without arrhythmia or heart block. He does report neck pain for several weeks, but CTA head in  2015 showed patent neck vessels. He has no carotid bruits on exam. Syncope may be vasovagal. Cardiology has seen patient and plan on stress test tomorrow. Other possibilities when  taking his multiple symptoms in consideration include endocrine pathology such as hyperthyroidism. His history of MI and reported CVA at a young age are also concerning for coagulopathy, although prior cocaine use may have led to his MI. -trend troponins -TTE -Carotid doppler -UDS, EtOH, HIV -Hgb A1C -Lipid panel -TSH -Homocysteine, lupus anticoagulant, B2-glycoprotein -Aspirin 81 mg -Telemetry   Hematuria: Patient with history of renal stones with lithotripsy in 2006. CT renal stone study in the ED shows bilateral nonobstructive nephrolithiasis. No calcified ureteral calculi, hydronephrosis, or hydroureter. No calcified calculi in the urinary bladder. Prostate and gland and seminal vesicles are unremarkable. Hematuria likely secondary to nephrolithiasis, but cystitis/infection also a possibility. Patient also taking 5-6 Aspirin and Ibuprofen per day which may contribute. Patient has a significant smoking history which is concerning for bladder or ureteral cancer. Patient will need outpatient urology follow up. -d/c Ceftriaxone, transition to oral Bactrim for 7 days total abx course -f/u urine culture -Outpatient urology follow up   Adrenal adenoma: Bilateral adrenal adenomas seen on CT imaging, stable when compared to prior exam. Hyperactive adrenals may be a cause for patient's constellation of symptoms, guidelines suggest workup for function of adrenal incidentaloma. -urine metanephrines -cortisol  Diet: Heart -> NPO at midnight  DVT ppx: Lovenox  Code: FULL   Dispo: Disposition is deferred at this time, awaiting improvement of current medical problems. Anticipated discharge in approximately 1-2 day(s).   The patient does not have a current PCP (No primary care provider on file.) and does not need an [facility]  hospital follow-up appointment after discharge.  The patient does not have transportation limitations that hinder transportation to clinic appointments.  Signed: Zada Finders, MD 01/17/2016, 5:52 PM

## 2016-01-17 NOTE — Plan of Care (Signed)
Problem: Education: Goal: Knowledge of [facility] Education information/materials will improve Outcome: Progressing Pt troponin negative x2. Pt UA completed. Pt schedule for stress test in the AM. Pt denies pain. Will continue to monitor Pt.

## 2016-01-18 ENCOUNTER — Observation Stay (HOSPITAL_COMMUNITY): Payer: Self-pay

## 2016-01-18 ENCOUNTER — Observation Stay (HOSPITAL_BASED_OUTPATIENT_CLINIC_OR_DEPARTMENT_OTHER): Payer: Self-pay

## 2016-01-18 DIAGNOSIS — R079 Chest pain, unspecified: Secondary | ICD-10-CM

## 2016-01-18 DIAGNOSIS — R55 Syncope and collapse: Secondary | ICD-10-CM

## 2016-01-18 LAB — PROTIME-INR
INR: 1.1 (ref 0.00–1.49)
PROTHROMBIN TIME: 14.4 s (ref 11.6–15.2)

## 2016-01-18 LAB — NM MYOCAR MULTI W/SPECT W/WALL MOTION / EF
Peak HR: 101 {beats}/min
Rest HR: 69 {beats}/min

## 2016-01-18 LAB — HIV ANTIBODY (ROUTINE TESTING W REFLEX): HIV SCREEN 4TH GENERATION: NONREACTIVE

## 2016-01-18 LAB — COMPREHENSIVE METABOLIC PANEL
ALBUMIN: 3.1 g/dL — AB (ref 3.5–5.0)
ALT: 18 U/L (ref 17–63)
AST: 20 U/L (ref 15–41)
Alkaline Phosphatase: 53 U/L (ref 38–126)
Anion gap: 10 (ref 5–15)
BUN: 22 mg/dL — AB (ref 6–20)
CHLORIDE: 103 mmol/L (ref 101–111)
CO2: 24 mmol/L (ref 22–32)
Calcium: 8.4 mg/dL — ABNORMAL LOW (ref 8.9–10.3)
Creatinine, Ser: 0.89 mg/dL (ref 0.61–1.24)
GFR calc Af Amer: >60 mL/min (ref >60)
GFR calc non Af Amer: >60 mL/min (ref >60)
GLUCOSE: 113 mg/dL — AB (ref 65–99)
POTASSIUM: 3.9 mmol/L (ref 3.5–5.1)
Sodium: 137 mmol/L (ref 135–145)
Total Bilirubin: 0.5 mg/dL (ref 0.3–1.2)
Total Protein: 5.9 g/dL — ABNORMAL LOW (ref 6.5–8.1)

## 2016-01-18 LAB — URINE CULTURE: Culture: NO GROWTH

## 2016-01-18 LAB — ECHOCARDIOGRAM COMPLETE
Height: 70 in
Weight: 2758.4 oz

## 2016-01-18 LAB — APTT: APTT: 30 s (ref 24–37)

## 2016-01-18 LAB — TROPONIN I

## 2016-01-18 LAB — MAGNESIUM: MAGNESIUM: 2 mg/dL (ref 1.7–2.4)

## 2016-01-18 MED ORDER — PANTOPRAZOLE SODIUM 40 MG PO TBEC
40.0000 mg | DELAYED_RELEASE_TABLET | Freq: Every day | ORAL | Status: DC
  Administered 2016-01-18: 40 mg via ORAL
  Filled 2016-01-18: qty 1

## 2016-01-18 MED ORDER — SULFAMETHOXAZOLE-TRIMETHOPRIM 800-160 MG PO TABS: 1.0000 | ORAL_TABLET | Freq: Two times a day (BID) | ORAL | Status: DC

## 2016-01-18 MED ORDER — REGADENOSON 0.4 MG/5ML IV SOLN
INTRAVENOUS | Status: AC
  Administered 2016-01-18: 0.4 mg via INTRAVENOUS
  Filled 2016-01-18: qty 5

## 2016-01-18 MED ORDER — TECHNETIUM TC 99M SESTAMIBI GENERIC - CARDIOLITE
10.0000 | Freq: Once | INTRAVENOUS | Status: AC | PRN
  Administered 2016-01-18: 10 via INTRAVENOUS

## 2016-01-18 MED ORDER — TECHNETIUM TC 99M SESTAMIBI GENERIC - CARDIOLITE
30.0000 | Freq: Once | INTRAVENOUS | Status: AC | PRN
  Administered 2016-01-18: 30 via INTRAVENOUS

## 2016-01-18 MED ORDER — GI COCKTAIL ~~LOC~~
30.0000 mL | Freq: Once | ORAL | Status: AC
  Administered 2016-01-18: 30 mL via ORAL
  Filled 2016-01-18: qty 30

## 2016-01-18 MED ORDER — REGADENOSON 0.4 MG/5ML IV SOLN
0.4000 mg | Freq: Once | INTRAVENOUS | Status: AC
  Administered 2016-01-18: 0.4 mg via INTRAVENOUS
  Filled 2016-01-18: qty 5

## 2016-01-18 MED ORDER — ASPIRIN 81 MG PO CHEW: 81.0000 mg | CHEWABLE_TABLET | Freq: Every day | ORAL | Status: DC

## 2016-01-18 NOTE — Discharge Summary (Signed)
Name: Leon Allen MRN: [id number] DOB: 12/05/1965 50 y.o. PCP: No Pcp Per Patient  Date of Admission: 01/17/2016  9:12 AM Date of Discharge: 01/18/2016 Attending Physician: Murriel Hopper, MD  Discharge Diagnosis: 1. Syncope  Principal Problem:   Syncope Active Problems:   Chest pain   Hematuria   Neck pain   Nephrolithiasis   History of tobacco abuse   Adrenal adenoma   History of CVA (cerebrovascular accident)   Cholelithiasis   Pulmonary nodules   Diverticulosis   Marijuana use  Discharge Medications:   Medication List    TAKE these medications        aspirin 81 MG chewable tablet  Chew 1 tablet (81 mg total) by mouth daily.     Fish Oil 1000 MG Caps  Take 3,000 mg by mouth daily.     sulfamethoxazole-trimethoprim 800-160 MG tablet  Commonly known as:  BACTRIM DS,SEPTRA DS  Take 1 tablet by mouth every 12 (twelve) hours.     VITAMIN B-12 PO  Take 1 tablet by mouth daily.     VITAMIN C PO  Take 1 tablet by mouth daily.     VITAMIN D PO  Take 1 tablet by mouth daily.     VITAMIN E PO  Take 1 tablet by mouth daily.        Disposition and follow-up:   Leon Allen was discharged from [facility] in Good condition.  At the hospital follow up visit please address:  1.   Syncope: Please address for further episodes, chest pain, palpitations. Outpatient CT head and EEG ordered.  Nephrolithiasis/Hematuria/UTI: Completion of antibiotics, follow up with Urology  2.  Labs / imaging needed at time of follow-up: none  3.  Pending labs/ test needing follow-up: urine culture, CT head, EEG  Follow-up Appointments: Follow-up Information    Follow up with [facility] On 01/23/2016.   Why:  230PM   Contact information:   [place] [phone]      Follow up with [facility] On 01/22/2016.   Why:  Hospital follow-up appointment on 01/22/16 at  11:00 am    Contact information:   [address]  [place] [phone]      Discharge Instructions: Discharge Instructions    Increase activity slowly    Complete by:  As directed            Consultations: Treatment Team:  Rounding Lbcardiology, MD  Procedures Performed:  Dg Chest 2 View  01/17/2016  CLINICAL DATA:  50 year old male with chest pain radiating to the left neck since last night. Initial encounter. EXAM: CHEST  2 VIEW COMPARISON:  CT Abdomen and Pelvis 06/01/2011. FINDINGS: Normal lung volumes. Normal cardiac size and mediastinal contours. Visualized tracheal air column is within normal limits. No pneumothorax, pulmonary edema, pleural effusion or confluent pulmonary opacity. No osseous abnormality identified. IMPRESSION: Negative, no acute cardiopulmonary abnormality. Electronically Signed   By: Genevie Ann M.D.   On: 01/17/2016 08:32   Nm Myocar Multi W/spect W/wall Motion / Ef  01/18/2016  CLINICAL DATA:  Chest pain yesterday. Personal history of coronary artery disease with angioplasty in 2005. Stroke 2015. EXAM: MYOCARDIAL IMAGING WITH SPECT (REST AND PHARMACOLOGIC-STRESS) GATED LEFT VENTRICULAR WALL MOTION STUDY LEFT VENTRICULAR EJECTION FRACTION TECHNIQUE: Standard myocardial SPECT imaging was performed after resting intravenous injection of 10 mCi Tc-21m sestamibi. Subsequently, intravenous infusion of Lexiscan was performed  under the supervision of the Cardiology staff. At peak effect of the drug, 30 mCi Tc-75m sestamibi was injected intravenously and standard myocardial SPECT imaging was performed. Quantitative gated imaging was also performed to evaluate left ventricular wall motion, and estimate left ventricular ejection fraction. COMPARISON:  Two-view chest x-ray 01/17/2016 FINDINGS: Perfusion: No decreased activity in the left ventricle on stress imaging to suggest reversible ischemia or infarction. Wall Motion: Normal left ventricular  wall motion. No left ventricular dilation. Left Ventricular Ejection Fraction: 52 % End diastolic volume [id number] ml End systolic volume 64 ml IMPRESSION: 1. No reversible ischemia or infarction. 2. Normal left ventricular wall motion. 3. Left ventricular ejection fraction 52% 4. Low-risk stress test findings*. *2012 Appropriate Use Criteria for Coronary Revascularization Focused Update: J Am Coll Cardiol. [id number]. http://content.airportbarriers.com.aspx?[email or web address] Electronically Signed   By: San Morelle M.D.   On: 01/18/2016 14:22   Ct Renal Stone Study  01/17/2016  CLINICAL DATA:  Intermittent right flank pain for 1 month, hematuria EXAM: CT ABDOMEN AND PELVIS WITHOUT CONTRAST TECHNIQUE: Multidetector CT imaging of the abdomen and pelvis was performed following the standard protocol without IV contrast. COMPARISON:  CT scan 06/01/2011 FINDINGS: Lower chest: Axial image 7 subpleural nodule in left lower lobe again noted measures 7 mm stable in size in appearance from prior exam. Second subpleural nodule in left lower lobe measures 6 mm stable in size in appearance from prior exam. No new pulmonary nodules are noted in visualized lung bases. Hepatobiliary: Unenhanced liver shows no biliary ductal dilatation. Multiple gallstones are noted within gallbladder the largest measures 7.8 mm. No CBD dilatation. Pancreas: Unenhanced pancreas is unremarkable. Spleen: Unenhanced spleen is unremarkable. Adrenals/Urinary Tract: Right adrenal adenoma measures 1.2 cm stable in size in appearance from prior exam. Left adrenal adenoma measures 1.4 cm stable in size in appearance from prior exam. No hydronephrosis or hydroureter. There is nonobstructive calcified calculus in right renal pelvis measures 1.3 cm. Nonobstructive calculus in midpole of the left kidney measures 3 mm. Bilateral no calcified ureteral calculi are noted. No calcified calculi are noted within urinary bladder. Stomach/Bowel: No  gastric outlet obstruction. No small bowel obstruction. No thickened or dilated small bowel loops. The terminal ileum is unremarkable. The patient is status post appendectomy. No pericecal inflammation. Few diverticula are noted sigmoid colon. No evidence of acute diverticulitis. No evidence of acute colitis. Vascular/Lymphatic: No aortic aneurysm. Mild atherosclerotic calcifications of abdominal aorta and iliac arteries. No mesenteric or retroperitoneal adenopathy. No inguinal adenopathy. Reproductive: Prostate gland and seminal vesicles are unremarkable. Other: No ascites or free air. Musculoskeletal: Sagittal images of the spine shows mild disc space flattening with anterior spurring at L5-S1 level. Mild degenerative changes lower thoracic spine. IMPRESSION: 1. There is bilateral nonobstructive nephrolithiasis. Nonobstructive calculus in right renal pelvis measures 1.3 cm. Nonobstructive calculus in midpole of the left kidney measures 3 mm. 2. No calcified ureteral calculi.  No hydronephrosis or hydroureter. 3. No pericecal inflammation.  Status post appendectomy. 4. No calcified calculi are noted within urinary bladder. 5. Stable bilateral probable adrenal adenoma. 6. No small bowel obstruction. 7. Few colonic diverticula are noted proximal sigmoid colon. No evidence of acute diverticulitis. Electronically Signed   By: Lahoma Crocker M.D.   On: 01/17/2016 13:34    2D Echo: TTE 01/18/16 Left ventricle: The cavity size was normal. Systolic function was  normal. The estimated ejection fraction was in the range of 55%  to 60%. Wall motion was normal; there were no regional wall  motion abnormalities. Left  ventricular diastolic function  parameters were normal. - Aortic valve: Trileaflet; normal thickness leaflets. There was no  regurgitation. - Aortic root: The aortic root was normal in size. - Mitral valve: There was no regurgitation. - Left atrium: The atrium was normal in size. - Right ventricle:  The cavity size was normal. Wall thickness was  normal. Systolic function was normal. - Right atrium: The atrium was normal in size. - Tricuspid valve: There was trivial regurgitation. - Pulmonic valve: There was no regurgitation. - Pulmonary arteries: The main pulmonary artery was normal-sized.  Systolic pressure was within the normal range. - Inferior vena cava: The vessel was normal in size. - Pericardium, extracardiac: There was no pericardial effusion.  Cardiac Cath: none  Admission HPI:  Leon Allen is a 50 year old male with PMH of MI in 2005 s/p PCI w/ stent placement (75-80% mid LAD), reported CVA 2 years ago (CTA head/neck 2015 unremarkable), and kidney stones (lithotripsy 2006) who presents with syncope, neck pain, and chest pain. He says his chest pain began last night all of a sudden. It was located at the left chest wall and beneath the left rib cage. He says it felt like a "toothache." He says this pain feels a little different than the pain he experienced during his heart attack. He says he has neck and jaw pain as well which has been going on for several weeks. He felt nauseous early this morning and went to the bathroom as he thought he was going to vomit. He says he felt hot and sweaty then passed out. He thinks he hit something when he fell. He reports occasional lightheadedness and tingling. He was put on Plavix after his heart attack, but stopped taking this due to insurance issues. Instead, he takes 5-6 baby aspirin per day to "thin his blood."   He also reports hematuria for the last 4 weeks. He works as a Development worker, community and while at work had lower back pain on both sides which felt similar to his prior kidney stone pain. He has occasionally noticed the blood in his urine since then which occurs throughout the urinary stream. He denies any dysuria. He says he drinks a 2 L bottle of coke daily and does not drink much water. He says he takes 5-6 Ibuprofen at once for headaches  he has experienced for the last few weeks.  He reports family history and several friends with cancer. He is concerned he may also have cancer. He reports seeing a commercial for Latuda and that he has all the side-effects mentioned. He is also requesting a catheterization instead of a stress test.  Social: Former smoker 3 PPD until his MI. Occasionally tries a cigarette, which exacerbates his neck pain. Former marijuana and cocaine use prior to his MI. Reports 1-2 alcoholic drinks (miller lite or mixed drink) per week, sometimes goes several months without drinking.  Family Hx: Mother died age 49 of Hodgkin's, Sister with breast cancer, cousins with lung cancer  In the ED, vitals were: BP 123/76 mmHg  Pulse 83  Temp(Src) 98.8 F (37.1 C) (Oral)  Resp 16  Ht 5\' 10"  (1.778 m)  Wt 78.926 kg  BMI 24.97 kg/m2  SpO2 97%  I-stat troponins were negative x2. EKG was without acute ischemic changes. He has t-wave inversions which are reportedly seen on prior EKGs (cannot view prior on my end). Urine was positive for nitritis, leukocytes, pyuria, and numerous RBCs.  CT renal stone study in the ED shows  bilateral nonobstructive nephrolithiasis. No calcified ureteral calculi, hydronephrosis, or hydroureter. No calcified calculi in the urinary bladder. Patient started on IV Ceftriaxone.  Hospital Course by problem list: Principal Problem:   Syncope Active Problems:   Chest pain   Hematuria   Neck pain   Nephrolithiasis   History of tobacco abuse   Adrenal adenoma   History of CVA (cerebrovascular accident)   Cholelithiasis   Pulmonary nodules   Diverticulosis   Marijuana use   Syncope: Patient with unwitnessed syncopal episode preceded by left sided chest pain, diaphoresis, nausea, and hot flash. No focal weakness or reported seizure activity. Chest pain resolved when seen in the ED. Troponins are negative and EKG without acute ischemic changes. MI ruled out. TTE normal with EF 55-60%.  Preliminary Carotid duplex without obvious ICA stenosis. Patient initially went for treadmill stress test, but was unable to reach target heart rate. He underwent Lexiscan afterwards which ws without reversible ischemia or infarct, showed normal LV wall motion, EF 52%. Results were a low-risk stress test. Syncope likely vasovagal in nature. No arrhythmia noted while admitted, however transient arrhythmia is a possibility. Patient felt at his baseline prior to discharge. Will obtain outpatient CT Head and EEG. Will follow up outpatient with [facility] and Cardiology    Hematuria: Patient with history of renal stones with lithotripsy in 2006. Urine was positive for nitrites, leukocytes, pyuria, and numerous RBCs. CT renal stone study in the ED showed bilateral nonobstructive nephrolithiasis. No calcified ureteral calculi, hydronephrosis, or hydroureter. No calcified calculi in the urinary bladder. Prostate and gland and seminal vesicles are unremarkable. Hematuria likely secondary to infection or nephrolithiasis. Patient to follow up with urology outpatient. For UTI, he was given IV Ceftriaxone once and transitioned to oral Bactrim to complete a 7 days total abx course.   Discharge Vitals:   BP 121/72 mmHg  Pulse 64  Temp(Src) 99.2 F (37.3 C) (Oral)  Resp 16  Ht 5\' 10"  (1.778 m)  Wt 172 lb 6.4 oz (78.2 kg)  BMI 24.74 kg/m2  SpO2 97%  Discharge Labs:  Results for orders placed or performed during the hospital encounter of 01/17/16 (from the past 24 hour(s))  Urine culture     Status: None   Collection Time: 01/17/16  6:47 PM  Result Value Ref Range   Specimen Description URINE, RANDOM    Special Requests NONE    Culture NO GROWTH 1 DAY    Report Status 01/18/2016 FINAL   Urine rapid drug screen (hosp performed)     Status: Abnormal   Collection Time: 01/17/16  6:48 PM  Result Value Ref Range   Opiates NONE DETECTED NONE DETECTED   Cocaine NONE DETECTED NONE DETECTED    Benzodiazepines NONE DETECTED NONE DETECTED   Amphetamines NONE DETECTED NONE DETECTED   Tetrahydrocannabinol POSITIVE (A) NONE DETECTED   Barbiturates NONE DETECTED NONE DETECTED  Protein / creatinine ratio, urine     Status: Abnormal   Collection Time: 01/17/16  6:48 PM  Result Value Ref Range   Creatinine, Urine 203.86 mg/dL   Total Protein, Urine 109 mg/dL   Protein Creatinine Ratio 0.53 (H) 0.00 - 0.15 mg/mg[Cre]  HIV antibody     Status: None   Collection Time: 01/17/16  7:54 PM  Result Value Ref Range   HIV Screen 4th Generation wRfx Non Reactive Non Reactive  Lipid panel     Status: Abnormal   Collection Time: 01/17/16  7:54 PM  Result Value Ref Range  Cholesterol 132 0 - 200 mg/dL   Triglycerides 60 <150 mg/dL   HDL 34 (L) >40 mg/dL   Total CHOL/HDL Ratio 3.9 RATIO   VLDL 12 0 - 40 mg/dL   LDL Cholesterol 86 0 - 99 mg/dL  Ethanol     Status: None   Collection Time: 01/17/16  7:54 PM  Result Value Ref Range   Alcohol, Ethyl (B) <5 <5 mg/dL  TSH     Status: None   Collection Time: 01/17/16  7:54 PM  Result Value Ref Range   TSH 1.043 0.350 - 4.500 uIU/mL  Troponin I     Status: None   Collection Time: 01/17/16  8:17 PM  Result Value Ref Range   Troponin I <0.03 <0.031 ng/mL  Troponin I     Status: None   Collection Time: 01/18/16  2:14 AM  Result Value Ref Range   Troponin I <0.03 <0.031 ng/mL  Comprehensive metabolic panel     Status: Abnormal   Collection Time: 01/18/16  2:16 AM  Result Value Ref Range   Sodium 137 135 - 145 mmol/L   Potassium 3.9 3.5 - 5.1 mmol/L   Chloride 103 101 - 111 mmol/L   CO2 24 22 - 32 mmol/L   Glucose, Bld 113 (H) 65 - 99 mg/dL   BUN 22 (H) 6 - 20 mg/dL   Creatinine, Ser 0.89 0.61 - 1.24 mg/dL   Calcium 8.4 (L) 8.9 - 10.3 mg/dL   Total Protein 5.9 (L) 6.5 - 8.1 g/dL   Albumin 3.1 (L) 3.5 - 5.0 g/dL   AST 20 15 - 41 U/L   ALT 18 17 - 63 U/L   Alkaline Phosphatase 53 38 - 126 U/L   Total Bilirubin 0.5 0.3 - 1.2 mg/dL   GFR  calc non Af Amer >60 >60 mL/min   GFR calc Af Amer >60 >60 mL/min   Anion gap 10 5 - 15  Protime-INR     Status: None   Collection Time: 01/18/16  2:16 AM  Result Value Ref Range   Prothrombin Time 14.4 11.6 - 15.2 seconds   INR 1.10 0.00 - 1.49  APTT     Status: None   Collection Time: 01/18/16  2:16 AM  Result Value Ref Range   aPTT 30 24 - 37 seconds  Magnesium     Status: None   Collection Time: 01/18/16  2:16 AM  Result Value Ref Range   Magnesium 2.0 1.7 - 2.4 mg/dL    Signed: Zada Finders, MD 01/18/2016, 6:09 PM    Services Ordered on Discharge: none Equipment Ordered on Discharge: none

## 2016-01-18 NOTE — Progress Notes (Signed)
VASCULAR LAB PRELIMINARY  PRELIMINARY  PRELIMINARY  PRELIMINARY  Carotid duplex completed.    Preliminary report:  Bilateral - No obvious evidence of ICA stenosis. Vertebral artery flow is antegrade.  Divante Kotch, RVS 01/18/2016, 8:27 AM

## 2016-01-18 NOTE — Progress Notes (Addendum)
Pt requesting to take his own pepcid. C/o stomach hurting. No chest pain or nausea. Teaching service  paged to request protonix for patient. [facility] [employer]

## 2016-01-18 NOTE — Care Management Note (Signed)
Case Management Note  Patient Details  Name: Leon Allen MRN: [id number] Date of Birth: 1966/05/23  Subjective/Objective:Pt in for syncope. No insurance or PCP. CM did ask [facility] Liaison to speak with pt in regards to hospital f/u.                     Action/Plan: Appointment scheduled for the [facility] and pt will be able to utilize the Pharmacy M-F. Cost range from $4.00 to $10.00. Appointment placed on the AVS. No further needs at this time.    Expected Discharge Date:                  Expected Discharge Plan:  Home/Self Care  In-House Referral:  NA  Discharge planning Services  CM Consult, Medication Assistance, [facility], Follow-up appt scheduled  Post Acute Care Choice:  NA Choice offered to:  NA  DME Arranged:  N/A DME Agency:  NA  HH Arranged:  NA HH Agency:  NA  Status of Service:  Completed, signed off  Medicare Important Message Given:    Date Medicare IM Given:    Medicare IM give by:    Date Additional Medicare IM Given:    Additional Medicare Important Message give by:     If discussed at [place] of Stay Meetings, dates discussed:    Additional Comments:  Bethena Roys, RN 01/18/2016, 4:33 PM

## 2016-01-18 NOTE — Evaluation (Signed)
Physical Therapy Evaluation Patient Details Name: Leon Allen MRN: [id number] DOB: [birth year] Today's Date: 01/18/2016   History of Present Illness  Patient is a 50 yo male admitted 01/17/16 with syncope, neck pain, chest pain.    PMH:  CAD, MI, PCI, CVA  Clinical Impression  Patient is independent with all mobility and gait.  Good balance with high level balance activities.  No acute PT needs identified - PT will sign off.    Follow Up Recommendations No PT follow up    Equipment Recommendations  None recommended by PT    Recommendations for Other Services       Precautions / Restrictions Precautions Precautions: None Restrictions Weight Bearing Restrictions: No      Mobility  Bed Mobility Overal bed mobility: Independent                Transfers Overall transfer level: Independent Equipment used: None                Ambulation/Gait Ambulation/Gait assistance: Independent Ambulation Distance (Feet): 200 Feet Assistive device: None Gait Pattern/deviations: WFL(Within Functional Limits)   Gait velocity interpretation: at or above normal speed for age/gender General Gait Details: Patient with good gait pattern, speed, and balance  Stairs            Wheelchair Mobility    Modified Rankin (Stroke Patients Only)       Balance Overall balance assessment: Independent                           High level balance activites: Backward walking;Direction changes;Turns;Sudden stops;Head turns High Level Balance Comments: No loss of balance with high level balance activities             Pertinent Vitals/Pain Pain Assessment: No/denies pain    Home Living Family/patient expects to be discharged to:: Private residence Living Arrangements: Alone   Type of Home: House Home Access: Level entry     Home Layout: One level Home Equipment: None      Prior Function Level of Independence: Independent               Hand  Dominance        Extremity/Trunk Assessment   Upper Extremity Assessment: Overall WFL for tasks assessed           Lower Extremity Assessment: Overall WFL for tasks assessed      Cervical / Trunk Assessment: Normal  Communication   Communication: No difficulties  Cognition Arousal/Alertness: Awake/alert Behavior During Therapy: WFL for tasks assessed/performed Overall Cognitive Status: Within Functional Limits for tasks assessed                      General Comments      Exercises        Assessment/Plan    PT Assessment Patent does not need any further PT services  PT Diagnosis Generalized weakness   PT Problem List    PT Treatment Interventions     PT Goals (Current goals can be found in the Care Plan section) Acute Rehab PT Goals PT Goal Formulation: All assessment and education complete, DC therapy    Frequency     Barriers to discharge        Co-evaluation               End of Session   Activity Tolerance: Patient tolerated treatment well Patient left: in bed;with call bell/phone within reach (sitting  EOB)      Functional Assessment Tool Used: Clinical judgement Functional Limitation: Mobility: Walking and moving around Mobility: Walking and Moving Around Current Status [phone]): 0 percent impaired, limited or restricted Mobility: Walking and Moving Around Goal Status [phone]): 0 percent impaired, limited or restricted Mobility: Walking and Moving Around Discharge Status [phone]): 0 percent impaired, limited or restricted    Time: [id number] PT Time Calculation (min) (ACUTE ONLY): 10 min   Charges:   PT Evaluation $PT Eval Low Complexity: 1 Procedure     PT G Codes:   PT G-Codes **NOT FOR INPATIENT CLASS** Functional Assessment Tool Used: Clinical judgement Functional Limitation: Mobility: Walking and moving around Mobility: Walking and Moving Around Current Status [id number]): 0 percent impaired, limited or restricted Mobility:  Walking and Moving Around Goal Status [id number]): 0 percent impaired, limited or restricted Mobility: Walking and Moving Around Discharge Status [phone]): 0 percent impaired, limited or restricted    Despina Pole 01/18/2016, 4:41 PM Carita Pian. Sanjuana Kava, [place] Pager [phone]

## 2016-01-18 NOTE — Progress Notes (Signed)
   Subjective: Patient seen after his stress test. He feels well today and is ready to leave the hospital. He has no chest pain today. Objective: Vital signs in last 24 hours: Filed Vitals:   01/18/16 1118 01/18/16 1119 01/18/16 1120 01/18/16 1500  BP: 130/77  100/71 121/72  Pulse: 65 77 66 64  Temp:    99.2 F (37.3 C)  TempSrc:    Oral  Resp:    16  Height:      Weight:      SpO2:    97%   Weight change:   Intake/Output Summary (Last 24 hours) at 01/18/16 1628 Last data filed at 01/18/16 0725  Gross per 24 hour  Intake    240 ml  Output    950 ml  Net   -710 ml   General: resting in bed HEENT: PERRL, EOMI Cardiac: RRR, no rubs, murmurs or gallops Pulm: clear to auscultation bilaterally, moving normal volumes of air Abd: soft, nontender, nondistended, BS present Ext: DP + 2 on right foot, difficult to palpate on left, PT +2 bilaterally, no pedal edema    Assessment/Plan: Principal Problem:   Syncope Active Problems:   Chest pain   Hematuria   Neck pain   Nephrolithiasis   History of tobacco abuse   Adrenal adenoma   History of CVA (cerebrovascular accident)   Cholelithiasis   Pulmonary nodules   Diverticulosis   Marijuana use  Syncope: Patient with syncopal episode preceded by left sided chest pain, diaphoresis, nausea, and hot flash. Chest pain has resolved. Troponins are negative and EKG without acute ischemic changes. MI ruled out. TTE normal with EF 55-60%. Preliminary Carotid duplex without obvious ICA stenosis. Patient initially went for treadmill stress test, but was unable to reach target heart rate. He underwent Lexiscan afterwards which ws without reversible ischemia or infarct, showed normal LV wall motion, EF 52%. Results were a low-risk stress test. Syncope likely vasovagal in nature. No arrhythmia noted while admitted, however transient arrhythmia is a possibility. Patient feels well today and wants to go home. He will follow up with Cardiology  outpatient. -Outpatient CT Head and EEG -will follow up outpatient with [facility] and Cardiology    Hematuria: Patient with history of renal stones with lithotripsy in 2006. CT renal stone study in the ED shows bilateral nonobstructive nephrolithiasis. No calcified ureteral calculi, hydronephrosis, or hydroureter. No calcified calculi in the urinary bladder. Prostate and gland and seminal vesicles are unremarkable. Hematuria likely secondary to infection or nephrolithiasis.  Patient has outpatient urology follow up scheduled. -oral Bactrim for 7 days total abx course -f/u urine culture -Outpatient urology follow up   Dispo:  Anticipated discharge today.     Zada Finders, MD 01/18/2016, 4:28 PM

## 2016-01-18 NOTE — Progress Notes (Signed)
  Echocardiogram 2D Echocardiogram has been performed.  Jennette Dubin 01/18/2016, 8:54 AM

## 2016-01-18 NOTE — Hospital Discharge Follow-Up (Signed)
[facility] [place]:  This Case Manager spoke with Jacqlyn Krauss, RN CM who indicated patient needing hospital follow-up appointment for CAD-s/p PCI with stent placement. Patient is uninsured and does not have a PCP. Discussed the medical management and resources (including onsite pharmacy, Financial Counseling) available at [facility] and [facility]. Patient appreciative of information and agreeable to appointment being scheduled. Appointment scheduled for 01/22/16 at 1100 at [place]. AVS updated. Jacqlyn Krauss, RN CM also updated.

## 2016-01-18 NOTE — Progress Notes (Signed)
    Subjective: No CP  Objective: Vital signs in last 24 hours: Temp:  [98.8 F (37.1 C)-100 F (37.8 C)] 99.1 F (37.3 C) (03/31 0531) Pulse Rate:  [61-67] 61 (03/31 1001) Resp:  [11-19] 18 (03/31 0407) BP: (102-133)/(62-89) 126/69 mmHg (03/31 1001) SpO2:  [96 %-99 %] 96 % (03/31 0407) Weight:  [172 lb 6.4 oz (78.2 kg)-175 lb 11.2 oz (79.697 kg)] 172 lb 6.4 oz (78.2 kg) (03/31 0407) Last BM Date: 01/17/16  Intake/Output from previous day: 03/30 0701 - 03/31 0700 In: 240 [P.O.:240] Out: 750 [Urine:750] Intake/Output this shift: Total I/O In: -  Out: 200 [Urine:200]  Medications Scheduled Meds: . aspirin  324 mg Oral Once  . aspirin  81 mg Oral Daily  . enoxaparin (LOVENOX) injection  40 mg Subcutaneous Q24H  . Influenza vac split quadrivalent PF  0.5 mL Intramuscular Tomorrow-1000  . pantoprazole  40 mg Oral Daily  . pneumococcal 23 valent vaccine  0.5 mL Intramuscular Tomorrow-1000  . sodium chloride flush  3 mL Intravenous Q12H  . sulfamethoxazole-trimethoprim  1 tablet Oral Q12H   Continuous Infusions:  PRN Meds:.acetaminophen **OR** acetaminophen, ondansetron **OR** ondansetron (ZOFRAN) IV, senna-docusate  PE: General appearance: alert, cooperative and no distress Lungs: clear to auscultation bilaterally Heart: regular rate and rhythm, S1, S2 normal, no murmur, click, rub or gallop Extremities: No LEE Pulses: 2+ and symmetric Skin: Warm and dry Neurologic: Grossly normal  Lab Results:   Recent Labs  01/17/16 0759  WBC 5.7  HGB 14.1  HCT 42.5  PLT 164   BMET  Recent Labs  01/17/16 0759 01/18/16 0216  NA 138 137  K 3.8 3.9  CL 104 103  CO2 23 24  GLUCOSE 121* 113*  BUN 21* 22*  CREATININE 0.93 0.89  CALCIUM 8.8* 8.4*   PT/INR  Recent Labs  01/18/16 0216  LABPROT 14.4  INR 1.10   Cholesterol  Recent Labs  01/17/16 1954  CHOL 132   Lipid Panel     Component Value Date/Time   CHOL 132 01/17/2016 1954   TRIG 60 01/17/2016  1954   HDL 34* 01/17/2016 1954   CHOLHDL 3.9 01/17/2016 1954   VLDL 12 01/17/2016 1954   [place] 86 01/17/2016 1954       Assessment/Plan   Principal Problem:   Syncope Active Problems:   Chest pain   Hematuria   Neck pain   Nephrolithiasis   History of tobacco abuse   Adrenal adenoma   History of CVA (cerebrovascular accident)   Cholelithiasis   Pulmonary nodules   Diverticulosis   Marijuana use  50 year old male with history of coronary artery disease ) MI in 2005. He underwent cardiac catheterization revealing a 75-80% ulcerative plaque in the mid LAD. He underwent successful PCI with stent placement.He presents with chest pain, hematuria, syncope, right-sided neck pain.  Ruled out for MI.  Treadmill Cardiolite today.  BP stable.      I had to change to a Lexiscan.  He was not going to be able to run on the treadmill long enough to get his HR to target.       Tarri Fuller PA-C 01/18/2016 10:44 AM  Personally seen and examined. Agree with above. Low risk NUC stress ECHO normal Reassuring No further cardiac workup.  Will sign off.   Candee Furbish, MD

## 2016-01-19 LAB — HCV COMMENT:

## 2016-01-19 LAB — HEMOGLOBIN A1C
HEMOGLOBIN A1C: 5.6 % (ref 4.8–5.6)
MEAN PLASMA GLUCOSE: 114 mg/dL

## 2016-01-19 LAB — HEPATITIS C ANTIBODY (REFLEX)

## 2016-01-21 LAB — HOMOCYSTEINE: HOMOCYSTEINE-NORM: 6.4 umol/L (ref 0.0–15.0)

## 2016-01-22 ENCOUNTER — Ambulatory Visit: Payer: Self-pay | Attending: Physician Assistant | Admitting: Physician Assistant

## 2016-01-22 VITALS — BP 142/85 | HR 63 | Temp 98.0°F | Resp 18 | Ht 70.0 in | Wt 172.4 lb

## 2016-01-22 DIAGNOSIS — Z8673 Personal history of transient ischemic attack (TIA), and cerebral infarction without residual deficits: Secondary | ICD-10-CM | POA: Insufficient documentation

## 2016-01-22 DIAGNOSIS — I639 Cerebral infarction, unspecified: Secondary | ICD-10-CM

## 2016-01-22 DIAGNOSIS — Z880 Allergy status to penicillin: Secondary | ICD-10-CM | POA: Insufficient documentation

## 2016-01-22 DIAGNOSIS — I252 Old myocardial infarction: Secondary | ICD-10-CM | POA: Insufficient documentation

## 2016-01-22 DIAGNOSIS — R319 Hematuria, unspecified: Secondary | ICD-10-CM

## 2016-01-22 DIAGNOSIS — Z7982 Long term (current) use of aspirin: Secondary | ICD-10-CM | POA: Insufficient documentation

## 2016-01-22 DIAGNOSIS — Z79899 Other long term (current) drug therapy: Secondary | ICD-10-CM | POA: Insufficient documentation

## 2016-01-22 DIAGNOSIS — I2510 Atherosclerotic heart disease of native coronary artery without angina pectoris: Secondary | ICD-10-CM

## 2016-01-22 DIAGNOSIS — R079 Chest pain, unspecified: Secondary | ICD-10-CM | POA: Insufficient documentation

## 2016-01-22 LAB — ANTIPHOSPHOLIPID SYNDROME EVAL, BLD
Anticardiolipin IgA: <9 APL U/mL (ref 0–11)
Anticardiolipin IgG: <9 GPL U/mL (ref 0–14)
Anticardiolipin IgM: <9 MPL U/mL (ref 0–12)
DRVVT: 36.1 s (ref 0.0–44.0)
PHOSPHATYDALSERINE, IGA: 1 {APS'U} (ref 0–20)
PHOSPHATYDALSERINE, IGG: 1 {GPS'U} (ref 0–11)
PHOSPHATYDALSERINE, IGM: 9 {MPS'U} (ref 0–25)
PTT LA: 31.4 s (ref 0.0–43.6)

## 2016-01-22 MED ORDER — METOPROLOL SUCCINATE ER 25 MG PO TB24: 25.0000 mg | ORAL_TABLET | Freq: Every day | ORAL | Status: DC

## 2016-01-22 MED ORDER — ATORVASTATIN CALCIUM 40 MG PO TABS
40.0000 mg | ORAL_TABLET | Freq: Every day | ORAL | Status: DC
Start: 2016-01-22 — End: 2022-08-08

## 2016-01-22 MED ORDER — NITROGLYCERIN 0.4 MG SL SUBL: 0.4000 mg | SUBLINGUAL_TABLET | SUBLINGUAL | Status: DC | PRN

## 2016-01-22 NOTE — Progress Notes (Signed)
Patient's here for hospital f/up for CAD. Patient has history of stroke.  Patient reports having sharp lower back pain with bleeding off and on when urinating. Patient states he has a Hx of Kidney and Gall stones.   Patient reports having dry mouth and feeling fatigue.

## 2016-01-22 NOTE — Progress Notes (Signed)
Leon Allen  [id number]  [id number]  DOB - August 28, 1966  Chief Complaint  Patient presents with  . Hospitalization Follow-up  . Nephrolithiasis  . Chest Pain       Subjective:   Leon Allen is a 50 y.o. male here today for establishment of care. He has a history of coronary artery disease with remote MI and PCI of the left anterior descending artery at age 65. He has a history of a stroke as well. He has a history of high cholesterol. He has a history of kidney stones. He is presenting after a hospitalization from 3/30 to 01/18/2016. He initially went in with left-sided chest pain that was slightly atypical in nature. Associated with syncope. He had associated nausea and diaphoresis and ultimately passed out. His troponin was negative. His EKG showed no acute changes. An echocardiogram was within normal limits and a Cardiolite stress test was normal. He was not sent home on beta blockers or statins.  He also described hematuria for months. He is very concerned he has prostate cancer. He has never had prostate cancer before but has been here in about from family members. He's had lower back pain. He does not drink a lot of water. In fact, he drinks a 2 L Coke almost daily. He takes multiple aspirin daily he takes multiple ibuprofen daily. In the emergency department a urinalysis was abnormal. He was started on IV antibiotics. A Renal CT showed nonobstructive stones. He is to complete a course of Bactrim.  He is a nonsmoker. His chest pain has subsided. He still very concerned about the hematuria that persists. He's been compliant with his antibiotics. He continues to not drink as much water as he should. He smokes marijuana almost daily.  ROS: GEN: denies fever or chills, denies change in weight Skin: denies lesions or rashes HEENT: denies headache, earache, epistaxis, sore throat, or neck pain LUNGS: denies SHOB, dyspnea, PND, orthopnea CV: denies CP or palpitations ABD:  denies abd pain, N or V EXT: denies muscle spasms or swelling; no pain in lower ext, no weakness NEURO: denies numbness or tingling, denies sz. + stroke or TIA  ALLERGIES: Allergies  Allergen Reactions  . Bee Venom Swelling  . Penicillins Other (See Comments)    Has patient had a PCN reaction causing immediate rash, facial/tongue/throat swelling, SOB or lightheadedness with hypotension: No Has patient had a PCN reaction causing severe rash involving mucus membranes or skin necrosis: No Has patient had a PCN reaction that required hospitalization No Has patient had a PCN reaction occurring within the last 10 years: No If all of the above answers are "NO", then may proceed with Cephalosporin use.    PAST MEDICAL HISTORY: Past Medical History  Diagnosis Date  . Adrenal adenoma     bilateral  . Hematuria 12/2015  . Complication of anesthesia     "@ dentist office; was overdosed"  . Kidney stone   . Coronary artery disease   . Heart attack ([place]) 2005  . Daily headache     "lately" (01/17/2016)  . CVA (cerebral vascular accident) ([place]) 2015    denies residual on 01/17/2016  . Gonorrhea 2015  . Situational depression     "old girlfriend missing"    PAST SURGICAL HISTORY: Past Surgical History  Procedure Laterality Date  . Appendectomy    . Inguinal hernia repair Right   . Lithotripsy  X 2  . Coronary angioplasty with stent placement  2005    MEDICATIONS AT HOME:  Prior to Admission medications   Medication Sig Start Date End Date Taking? Authorizing Provider  Ascorbic Acid (VITAMIN C PO) Take 1 tablet by mouth daily.   Yes Historical Provider, MD  aspirin 81 MG chewable tablet Chew 1 tablet (81 mg total) by mouth daily. 01/18/16  Yes Rushil Sherrye Payor, MD  Cholecalciferol (VITAMIN D PO) Take 1 tablet by mouth daily.   Yes Historical Provider, MD  Cyanocobalamin (VITAMIN B-12 PO) Take 1 tablet by mouth daily.   Yes Historical Provider, MD  Omega-3 Fatty Acids (FISH OIL) 1000 MG  CAPS Take 3,000 mg by mouth daily.   Yes Historical Provider, MD  sulfamethoxazole-trimethoprim (BACTRIM DS,SEPTRA DS) 800-160 MG tablet Take 1 tablet by mouth every 12 (twelve) hours. 01/18/16  Yes Rushil Sherrye Payor, MD  VITAMIN E PO Take 1 tablet by mouth daily.   Yes Historical Provider, MD  atorvastatin (LIPITOR) 40 MG tablet Take 1 tablet (40 mg total) by mouth daily. 01/22/16   Alcides Nutting Daneil Dan, PA-C  metoprolol succinate (TOPROL-XL) 25 MG 24 hr tablet Take 1 tablet (25 mg total) by mouth daily. 01/22/16   Kaitlin Ardito Daneil Dan, PA-C  nitroGLYCERIN (NITROSTAT) 0.4 MG SL tablet Place 1 tablet (0.4 mg total) under the tongue every 5 (five) minutes as needed for chest pain. 01/22/16   Brayton Caves, PA-C     Objective:   Filed Vitals:   01/22/16 1055  BP: 142/85  Pulse: 63  Temp: 98 F (36.7 C)  TempSrc: Oral  Resp: 18  Height: 5\' 10"  (1.778 m)  Weight: 172 lb 6.4 oz (78.2 kg)  SpO2: 99%    Exam General appearance : Awake, alert, not in any distress. Speech Clear. Not toxic looking Neck: supple, no JVD. No cervical lymphadenopathy.  Chest:Good air entry bilaterally, no added sounds  CVS: S1 S2 regular, no murmurs.  Abdomen: Bowel sounds present, Non tender and not distended with no gaurding, rigidity or rebound. Extremities: B/L Lower Ext shows no edema, both legs are warm to touch Neurology: Awake alert, and oriented X 3, CN II-XII intact, Non focal  Data Review Lab Results  Component Value Date   HGBA1C 5.6 01/17/2016    Assessment & Plan  1. Atypical CP, noncardiac-subsided 2. CAD  -RF modification. Start Statin. Cut ASA to 81 mg daily. Add BB. Refilled NTG.    -Cont Fish oil if he likes.  -Exercise program discussed. 3. Hx CVA  -RF modification as above 4. Hematuria  -Keep appt tomorrow with Urology  -Complete course of Bactrim   Return in about 4 weeks (around 02/19/2016). For routine health maintenance.  The patient was given clear instructions to go to ER or return to  medical center if symptoms don't improve, worsen or new problems develop. The patient verbalized understanding. The patient was told to call to get lab results if they haven't heard anything in the next week.   This note has been created with Surveyor, quantity. Any transcriptional errors are unintentional.    Zettie Pho, PA-C [facility] and [facility] [place], [place]   01/22/2016, 11:50 AM

## 2016-01-24 LAB — CORTISOL, URINE, FREE
CORTISOL (UR), FREE: 8 ug/(24.h) (ref 0–50)
CORTISOL,F,UG/L,U: 180 ug/L
VOLUME, URINE-CORTUR: 45 mL

## 2016-03-03 ENCOUNTER — Encounter: Payer: Self-pay | Admitting: Family Medicine

## 2016-03-03 ENCOUNTER — Ambulatory Visit: Payer: Self-pay | Attending: Family Medicine | Admitting: Family Medicine

## 2016-03-03 VITALS — BP 136/84 | HR 62 | Temp 98.0°F | Resp 16 | Ht 70.0 in | Wt 185.8 lb

## 2016-03-03 DIAGNOSIS — Z7982 Long term (current) use of aspirin: Secondary | ICD-10-CM | POA: Insufficient documentation

## 2016-03-03 DIAGNOSIS — Z87891 Personal history of nicotine dependence: Secondary | ICD-10-CM | POA: Insufficient documentation

## 2016-03-03 DIAGNOSIS — Z87442 Personal history of urinary calculi: Secondary | ICD-10-CM | POA: Insufficient documentation

## 2016-03-03 DIAGNOSIS — Z880 Allergy status to penicillin: Secondary | ICD-10-CM | POA: Insufficient documentation

## 2016-03-03 DIAGNOSIS — N529 Male erectile dysfunction, unspecified: Secondary | ICD-10-CM | POA: Insufficient documentation

## 2016-03-03 DIAGNOSIS — N200 Calculus of kidney: Secondary | ICD-10-CM

## 2016-03-03 DIAGNOSIS — I252 Old myocardial infarction: Secondary | ICD-10-CM | POA: Insufficient documentation

## 2016-03-03 DIAGNOSIS — I2510 Atherosclerotic heart disease of native coronary artery without angina pectoris: Secondary | ICD-10-CM | POA: Insufficient documentation

## 2016-03-03 DIAGNOSIS — Z8673 Personal history of transient ischemic attack (TIA), and cerebral infarction without residual deficits: Secondary | ICD-10-CM | POA: Insufficient documentation

## 2016-03-03 DIAGNOSIS — Z79899 Other long term (current) drug therapy: Secondary | ICD-10-CM | POA: Insufficient documentation

## 2016-03-03 NOTE — Patient Instructions (Signed)
Erectile Dysfunction  Erectile dysfunction is the inability to get or sustain a good enough erection to have sexual intercourse. Erectile dysfunction may involve:   Inability to get an erection.   Lack of enough hardness to allow penetration.   Loss of the erection before sex is finished.   Premature ejaculation.  CAUSES   Certain drugs, such as:    Pain relievers.    Antihistamines.    Antidepressants.    Blood pressure medicines.    Water pills (diuretics).    Ulcer medicines.    Muscle relaxants.    Illegal drugs.   Excessive drinking.   Psychological causes, such as:    Anxiety.    Depression.    Sadness.    Exhaustion.    Performance fear.    Stress.   Physical causes, such as:    Artery problems. This may include diabetes, smoking, liver disease, or atherosclerosis.    High blood pressure.    Hormonal problems, such as low testosterone.    Obesity.    Nerve problems. This may include back or pelvic injuries, diabetes mellitus, multiple sclerosis, or Parkinson disease.  SYMPTOMS   Inability to get an erection.   Lack of enough hardness to allow penetration.   Loss of the erection before sex is finished.   Premature ejaculation.   Normal erections at some times, but with frequent unsatisfactory episodes.   Orgasms that are not satisfactory in sensation or frequency.   Low sexual satisfaction in either partner because of erection problems.   A curved penis occurring with erection. The curve may cause pain or may be too curved to allow for intercourse.   Never having nighttime erections.  DIAGNOSIS  Your caregiver can often diagnose this condition by:   Performing a physical exam to find other diseases or specific problems with the penis.   Asking you detailed questions about the problem.   Performing blood tests to check for diabetes mellitus or to measure hormone levels.   Performing urine tests to find other underlying health conditions.   Performing an ultrasound exam to check for  scarring.   Performing a test to check blood flow to the penis.   Doing a sleep study at home to measure nighttime erections.  TREATMENT    You may be prescribed medicines by mouth.   You may be given medicine injections into the penis.   You may be prescribed a vacuum pump with a ring.   Penile implant surgery may be performed. You may receive:    An inflatable implant.    A semirigid implant.   Blood vessel surgery may be performed.  HOME CARE INSTRUCTIONS   If you are prescribed oral medicine, you should take the medicine as prescribed. Do not increase the dosage without first discussing it with your physician.   If you are using self-injections, be careful to avoid any veins that are on the surface of the penis. Apply pressure to the injection site for 5 minutes.   If you are using a vacuum pump, make sure you have read the instructions before using it. Discuss any questions with your physician before taking the pump home.  SEEK MEDICAL CARE IF:   You experience pain that is not responsive to the pain medicine you have been prescribed.   You experience nausea or vomiting.  SEEK IMMEDIATE MEDICAL CARE IF:    When taking oral or injectable medications, you experience an erection that lasts longer than 4 hours. If your   physician is unavailable, go to the nearest emergency room for evaluation. An erection that lasts much longer than 4 hours can result in permanent damage to your penis.   You have pain that is severe.   You develop redness, severe pain, or severe swelling of your penis.   You have redness spreading up into your groin or lower abdomen.   You are unable to pass your urine.     This information is not intended to replace advice given to you by your health care provider. Make sure you discuss any questions you have with your health care provider.     Document Released: 10/03/2000 Document Revised: 06/08/2013 Document Reviewed: 03/10/2013  Elsevier Interactive Patient Education 2016  [employer]

## 2016-03-03 NOTE — Progress Notes (Signed)
CC: Follow-up visit.  HPI: Leon Allen is a 50 y.o. male with a history of coronary artery disease status post PCI of the left anterior descending artery at 50 years of age, remote history of CVA, nephrolithiasis (status post lithotripsy 6 months ago) here today for a follow up visit.   He was commenced on a beta blocker and atorvastatin at his last office visit with the physician assistant but has stopped taking the atorvastatin because "it causes erectile dysfunction". He is requesting a prescription for something else for a prescription for Cialis.  He does have occasional flank pain from his kidney stones but has no pain at this time and does have access to 6 when pain is severe. He has also had a recent follow-up with urology.   Patient has No headache, No chest pain, No abdominal pain - No Nausea, No new weakness tingling or numbness, No Cough - SOB.  Allergies  Allergen Reactions  . Bee Venom Swelling  . Penicillins Other (See Comments)    Has patient had a PCN reaction causing immediate rash, facial/tongue/throat swelling, SOB or lightheadedness with hypotension: No Has patient had a PCN reaction causing severe rash involving mucus membranes or skin necrosis: No Has patient had a PCN reaction that required hospitalization No Has patient had a PCN reaction occurring within the last 10 years: No If all of the above answers are "NO", then may proceed with Cephalosporin use.   Past Medical History  Diagnosis Date  . Adrenal adenoma     bilateral  . Hematuria 12/2015  . Complication of anesthesia     "@ dentist office; was overdosed"  . Kidney stone   . Coronary artery disease   . Heart attack ([place]) 2005  . Daily headache     "lately" (01/17/2016)  . CVA (cerebral vascular accident) ([place]) 2015    denies residual on 01/17/2016  . Gonorrhea 2015  . Situational depression     "old girlfriend missing"   Current Outpatient Prescriptions on File Prior to Visit  Medication  Sig Dispense Refill  . Ascorbic Acid (VITAMIN C PO) Take 1 tablet by mouth daily.    Marland Kitchen aspirin 81 MG chewable tablet Chew 1 tablet (81 mg total) by mouth daily.    Marland Kitchen atorvastatin (LIPITOR) 40 MG tablet Take 1 tablet (40 mg total) by mouth daily. 90 tablet 3  . Cholecalciferol (VITAMIN D PO) Take 1 tablet by mouth daily.    . Cyanocobalamin (VITAMIN B-12 PO) Take 1 tablet by mouth daily.    . metoprolol succinate (TOPROL-XL) 25 MG 24 hr tablet Take 1 tablet (25 mg total) by mouth daily. 90 tablet 3  . nitroGLYCERIN (NITROSTAT) 0.4 MG SL tablet Place 1 tablet (0.4 mg total) under the tongue every 5 (five) minutes as needed for chest pain. 50 tablet 3  . Omega-3 Fatty Acids (FISH OIL) 1000 MG CAPS Take 3,000 mg by mouth daily.    Marland Kitchen VITAMIN E PO Take 1 tablet by mouth daily.    Marland Kitchen sulfamethoxazole-trimethoprim (BACTRIM DS,SEPTRA DS) 800-160 MG tablet Take 1 tablet by mouth every 12 (twelve) hours. (Patient not taking: Reported on 03/03/2016) 11 tablet 0   No current facility-administered medications on file prior to visit.   Family History  Problem Relation Age of Onset  . Cancer Mother    Social History   Social History  . Marital Status: Divorced    Spouse Name: N/A  . Number of Children: N/A  . Years of Education:  N/A   Occupational History  . Not on file.   Social History Main Topics  . Smoking status: Former Smoker -- 3.00 packs/day for 40 years    Types: Cigarettes    Quit date: 10/21/2015  . Smokeless tobacco: Former Systems developer    Types: Chew     Comment: "chewed some when I was young; not for long"  . Alcohol Use: 0.0 oz/week    0 Standard drinks or equivalent per week     Comment: 01/17/2016 "I'll drink beer or a mixed drink a few times/year"  . Drug Use: Yes    Special: Marijuana     Comment: 01/17/2016 "stopped marijuana 10/21/2015"  . Sexual Activity: Yes   Other Topics Concern  . Not on file   Social History Narrative    Review of Systems: Constitutional: Negative for  fever, chills, diaphoresis, activity change, appetite change and fatigue. HENT: Negative for ear pain, nosebleeds, congestion, facial swelling, rhinorrhea, neck pain, neck stiffness and ear discharge.  Eyes: Negative for pain, discharge, redness, itching and visual disturbance. Respiratory: Negative for cough, choking, chest tightness, shortness of breath, wheezing and stridor.  Cardiovascular: Negative for chest pain, palpitations and leg swelling. Gastrointestinal: Negative for abdominal distention. Genitourinary: Negative for dysuria, urgency, frequency, hematuria, flank pain, decreased urine volume, difficulty urinating and dyspareunia,Positive for erectile dysfunction  Musculoskeletal: Negative for back pain, joint swelling, arthralgias and gait problem. Neurological: Negative for dizziness, tremors, seizures, syncope, facial asymmetry, speech difficulty, weakness, light-headedness, numbness and headaches.  Hematological: Negative for adenopathy. Does not bruise/bleed easily. Psychiatric/Behavioral: Negative for hallucinations, behavioral problems, confusion, dysphoric mood, decreased concentration and agitation.    Objective:   Filed Vitals:   03/03/16 1056  BP: 136/84  Pulse: 62  Temp: 98 F (36.7 C)  Resp: 16    Physical Exam: Constitutional: Patient appears well-developed and well-nourished. No distress. HENT: Normocephalic, atraumatic, External right and left ear normal. Oropharynx is clear and moist.  Eyes: Conjunctivae and EOM are normal. PERRLA, no scleral icterus. Neck: Normal ROM. Neck supple. No JVD. No tracheal deviation. No thyromegaly. CVS: RRR, S1/S2 +, no murmurs, no gallops, no carotid bruit.  Pulmonary: Effort and breath sounds normal, no stridor, rhonchi, wheezes, rales.  Abdominal: Soft. BS +,  no distension, tenderness, rebound or guarding.  Musculoskeletal: Normal range of motion. No edema and no tenderness.  Lymphadenopathy: No lymphadenopathy noted,  cervical, inguinal or axillary Neuro: Alert. Normal reflexes, muscle tone coordination. No cranial nerve deficit. Skin: Skin is warm and dry. No rash noted. Not diaphoretic. No erythema. No pallor. Psychiatric: Normal mood and affect. Behavior, judgment, thought content normal.  Lab Results  Component Value Date   WBC 5.7 01/17/2016   HGB 14.1 01/17/2016   HCT 42.5 01/17/2016   MCV 90.2 01/17/2016   PLT 164 01/17/2016   Lab Results  Component Value Date   CREATININE 0.89 01/18/2016   BUN 22* 01/18/2016   NA 137 01/18/2016   K 3.9 01/18/2016   CL 103 01/18/2016   CO2 24 01/18/2016    Lab Results  Component Value Date   HGBA1C 5.6 01/17/2016   Lipid Panel     Component Value Date/Time   CHOL 132 01/17/2016 1954   TRIG 60 01/17/2016 1954   HDL 34* 01/17/2016 1954   CHOLHDL 3.9 01/17/2016 1954   VLDL 12 01/17/2016 1954   [place] 86 01/17/2016 1954       Assessment and plan:  Nephrolithiasis: History of recurrent kidney stones No pain at this time  and patient states he does not need anything for pain. Status post lithotripsy 6 months ago  Coronary artery disease: Risk factor modification Advised he would need to be on a statin and so I have switched him to simvastatin since he complains atorvastatin causes erectile dysfunction  Erectile dysfunction I have explained to him this is multifactorial Unable to place him on Cialis due to the fact that he is on nitrates.  History of CVA No residual deficits  This note has been created with Surveyor, quantity. Any transcriptional errors are unintentional.     Arnoldo Morale, MD. [facility] [phone] 03/03/2016, 11:20 AM

## 2016-03-03 NOTE — Progress Notes (Signed)
Patient's here to est.care and 4 week f/up coronary artery disease.  Patient states he has kidney stones in both kidney's still causing pain.  Patient denies any pain today.

## 2022-07-06 NOTE — Progress Notes (Unsigned)
Cardiology Office Note:    Date:  07/07/2022   ID:  Leon Allen, DOB 1966-05-23, MRN [id number]  PCP:  No primary care provider on file.   Leon Allen Providers Cardiologist:  None     Referring MD: No ref. provider found   CC: Return of angina Consulted for the evaluation of chest pain   History of Present Illness:    Leon Allen is a 56 y.o. male with a hx of CAD with prior mLAD PCI, prior tobacco and marijuana use.    Patient notes that he is doing poorly.. Since last visit notes that he has had a return of his chest pain similar to his prior stent.  One or two weeks ago was feeling fine. Prior to his prior PCI; felt a sharp chest pain that was going into his chest.  Was smoking, and doing cocaine.  Had PCI to mLAD.  Stopped cocaine and rare THC no tobacco.  No long drinks.  A week ago had a return of his char pain while feeling the dog.  Is having sharp pain right now.  His nitroglycerin is so old that he wasn't able to take it.  Chest pain radiates to the neck.    Dr. Acie Allen told him that sex was a best exercise.  Had girlfriend until three to four weeks ago.  Was not having CP at that time.  Notes SOB. But feels dry mouth.  Had recent fix in his teeth. Notes prior history of stroke, last in 2017.  No prior carotid disease. Negative stress test in 2017.   Past Medical History:  Diagnosis Date   Adrenal adenoma    bilateral   Complication of anesthesia    "@ dentist office; was overdosed"   Coronary artery disease    CVA (cerebral vascular accident) ([place]) 2015   denies residual on 01/17/2016   Daily headache    "lately" (01/17/2016)   Gonorrhea 2015   Heart attack ([place]) 2005   Hematuria 12/2015   Kidney stone    Situational depression    "old girlfriend missing"    Past Surgical History:  Procedure Laterality Date   APPENDECTOMY     CORONARY ANGIOPLASTY WITH STENT PLACEMENT  2005   INGUINAL HERNIA REPAIR Right    LITHOTRIPSY  X 2     Current Medications: Current Meds  Medication Sig   Ascorbic Acid (VITAMIN C PO) Take 4 tablets by mouth daily.   aspirin 81 MG chewable tablet Chew 1 tablet (81 mg total) by mouth daily. (Patient taking differently: Chew 4-5 tablets by mouth daily.)   atorvastatin (LIPITOR) 40 MG tablet Take 1 tablet (40 mg total) by mouth daily.   Cyanocobalamin (VITAMIN B-12 PO) Take 4 tablets by mouth daily.   isosorbide mononitrate (IMDUR) 30 MG 24 hr tablet Take 1 tablet (30 mg total) by mouth daily.   lisinopril (ZESTRIL) 5 MG tablet Take 5 mg by mouth daily.   metoprolol succinate (TOPROL-XL) 25 MG 24 hr tablet Take 1 tablet (25 mg total) by mouth daily.   nystatin cream (MYCOSTATIN) as needed for rash.   Omega-3 Fatty Acids (FISH OIL) 1000 MG CAPS Take 3,000 mg by mouth daily.   [DISCONTINUED] Cholecalciferol (VITAMIN D PO) Take 1 tablet by mouth daily.   [DISCONTINUED] nitroGLYCERIN (NITROSTAT) 0.4 MG SL tablet Place 1 tablet (0.4 mg total) under the tongue every 5 (five) minutes as needed for chest pain.   [DISCONTINUED] VITAMIN E PO Take 1 tablet by  mouth daily.     Allergies:   Bee venom and Penicillins   Social History   Socioeconomic History   Marital status: Divorced    Spouse name: Not on file   Number of children: Not on file   Years of education: Not on file   Highest education level: Not on file  Occupational History   Not on file  Tobacco Use   Smoking status: Former    Packs/day: 3.00    Years: 40.00    Total pack years: 120.00    Types: Cigarettes    Quit date: 10/21/2015    Years since quitting: 6.7   Smokeless tobacco: Former    Types: Chew   Tobacco comments:    "chewed some when I was young; not for long"  Substance and Sexual Activity   Alcohol use: Yes    Alcohol/week: 0.0 standard drinks of alcohol    Comment: 01/17/2016 "I'll drink beer or a mixed drink a few times/year"   Drug use: Yes    Types: Marijuana    Comment: 01/17/2016 "stopped marijuana  10/21/2015"   Sexual activity: Yes  Other Topics Concern   Not on file  Social History Narrative   Not on file   Social Determinants of Health   Financial Resource Strain: Not on file  Food Insecurity: Not on file  Transportation Needs: Not on file  Physical Activity: Not on file  Stress: Not on file  Social Connections: Not on file     Family History: The patient's family history includes Cancer in his mother.  ROS:   Please see the history of present illness.     All other systems reviewed and are negative.  EKGs/Labs/Other Studies Reviewed:    The following studies were reviewed today:  EKG:  EKG is  ordered today.  The ekg ordered today demonstrates  07/07/22: SR rate 64  Transthoracic Echocardiogram: Date: 2017 Report Results: - Left ventricle: The cavity size was normal. Systolic function was    normal. The estimated ejection fraction was in the range of 55%    to 60%. Wall motion was normal; there were no regional wall    motion abnormalities. Left ventricular diastolic function    parameters were normal.  - Aortic valve: Trileaflet; normal thickness leaflets. There was no    regurgitation.  - Aortic root: The aortic root was normal in size.  - Mitral valve: There was no regurgitation.  - Left atrium: The atrium was normal in size.  - Right ventricle: The cavity size was normal. Wall thickness was    normal. Systolic function was normal.  - Right atrium: The atrium was normal in size.  - Tricuspid valve: There was trivial regurgitation.  - Pulmonic valve: There was no regurgitation.  - Pulmonary arteries: The main pulmonary artery was normal-sized.    Systolic pressure was within the normal range.  - Inferior vena cava: The vessel was normal in size.  - Pericardium, extracardiac: There was no pericardial effusion.   Recent Labs: No results found for requested labs within last 365 days.  Recent Lipid Panel    Component Value Date/Time   CHOL 132  01/17/2016 1954   TRIG 60 01/17/2016 1954   HDL 34 (L) 01/17/2016 1954   CHOLHDL 3.9 01/17/2016 1954   VLDL 12 01/17/2016 1954   [place] 86 01/17/2016 1954    Physical Exam:    VS:  BP 130/80   Pulse 64   Ht '5\' 10"'$  (1.778 m)  Wt 173 lb (78.5 kg)   SpO2 97%   BMI 24.82 kg/m     Wt Readings from Last 3 Encounters:  07/07/22 173 lb (78.5 kg)  03/03/16 185 lb 12.8 oz (84.3 kg)  01/22/16 172 lb 6.4 oz (78.2 kg)    Gen: No distress    Neck: No JVD, no carotid bruit Cardiac: No Rubs or Gallops, no murmur, RRR +2 radial pulses Respiratory: Clear to auscultation bilaterally, normal effort, normal  respiratory rate GI: Soft, nontender, non-distended  MS: No  edema;  moves all extremities Integument: Skin shows evidence of lower extremity skin changes Neuro:  At time of evaluation, alert and oriented to person/place/time/situation  Psych: Normal affect, patient feels warm  ASSESSMENT:    1. Coronary artery disease involving native coronary artery of native heart with unstable angina pectoris ([place])   2. History of tobacco abuse   3. History of CVA (cerebrovascular accident)    PLAN:    Coronary Artery Disease HTN Former Poly-substance abuse, hx of CVA - symptomatic  - anatomy: prior mid LAD PCI - continue ASA 81 mg - continue statin, goal LDL < 55 - continue succinate 25 mg PO daily - will give PRN Nitro - will start Imdur 30 mg PO daily - Will ask billing what cath would cost; needs orange card and social work  - ultimately if he has breath through pain he will need ED assessment for LHC; given his social situation we are trying to treat medically and optimized his therapy  6-8 weeks me or APP        Medication Adjustments/Labs and Tests Ordered: Current medicines are reviewed at length with the patient today.  Concerns regarding medicines are outlined above.  No orders of the defined types were placed in this encounter.  Meds ordered this encounter  Medications    nitroGLYCERIN (NITROSTAT) 0.4 MG SL tablet    Sig: Place 1 tablet (0.4 mg total) under the tongue every 5 (five) minutes as needed for chest pain.    Dispense:  25 tablet    Refill:  3   isosorbide mononitrate (IMDUR) 30 MG 24 hr tablet    Sig: Take 1 tablet (30 mg total) by mouth daily.    Dispense:  90 tablet    Refill:  3    Patient Instructions  Medication Instructions:  Your physician has recommended you make the following change in your medication:  START: isosorbide mononitrate (Imdur) 20 mg by mouth once daily  REFILLED: Nitroglycerin 0.4 mg under your tongue as needed for Chest Pain  If you have chest pain place 1 tablet under your tongue, wait 5 min If chest pain continues place a 2nd tablet under your tongue, wait 5 min If chest pain continues place a 3rd tablet under your tongue, wait 5 min If chest pain continues call 911  *If you need a refill on your cardiac medications before your next appointment, please call your pharmacy*   Lab Work: NONE If you have labs (blood work) drawn today and your tests are completely normal, you will receive your results only by: [place] (if you have MyChart) OR A paper copy in the mail If you have any lab test that is abnormal or we need to change your treatment, we will call you to review the results.   Testing/Procedures: NONE   Follow-Up: At [facility], you and your health needs are our priority.  As part of our continuing mission to provide you with  exceptional heart care, we have created designated Provider Care Teams.  These Care Teams include your primary Cardiologist (physician) and Advanced Practice Providers (APPs -  Physician Assistants and Nurse Practitioners) who all work together to provide you with the care you need, when you need it.  We recommend signing up for the patient portal called "MyChart".  Sign up information is provided on this After Visit Summary.  MyChart is used to connect with  patients for Virtual Visits (Telemedicine).  Patients are able to view lab/test results, encounter notes, upcoming appointments, etc.  Non-urgent messages can be sent to your provider as well.   To learn more about what you can do with MyChart, go to [email or web address].    Your next appointment:   6-8 week(s)  The format for your next appointment:   In Person  Provider:   None  or Dayna Dunn, PA-C, Ermalinda Barrios, PA-C, or Christen Bame, NP       Other Instructions Provided you with information for financial assistance.                                                                                                                                                                                                                                                                                                                                                            Important Information About Sugar         Signed, Werner Lean, MD  07/07/2022 12:40 PM    [place]

## 2022-07-07 ENCOUNTER — Ambulatory Visit: Payer: Self-pay | Attending: Internal Medicine | Admitting: Internal Medicine

## 2022-07-07 ENCOUNTER — Encounter: Payer: Self-pay | Admitting: Internal Medicine

## 2022-07-07 ENCOUNTER — Telehealth: Payer: Self-pay | Admitting: Licensed Clinical Social Worker

## 2022-07-07 VITALS — BP 130/80 | HR 64 | Ht 70.0 in | Wt 173.0 lb

## 2022-07-07 DIAGNOSIS — I25110 Atherosclerotic heart disease of native coronary artery with unstable angina pectoris: Secondary | ICD-10-CM

## 2022-07-07 DIAGNOSIS — Z8673 Personal history of transient ischemic attack (TIA), and cerebral infarction without residual deficits: Secondary | ICD-10-CM

## 2022-07-07 DIAGNOSIS — Z87891 Personal history of nicotine dependence: Secondary | ICD-10-CM

## 2022-07-07 MED ORDER — ISOSORBIDE MONONITRATE ER 30 MG PO TB24: 30.0000 mg | ORAL_TABLET | Freq: Every day | ORAL | 3 refills | Status: DC

## 2022-07-07 MED ORDER — NITROGLYCERIN 0.4 MG SL SUBL: 0.4000 mg | SUBLINGUAL_TABLET | SUBLINGUAL | 3 refills | Status: AC | PRN

## 2022-07-07 NOTE — Patient Instructions (Signed)
Medication Instructions:  Your physician has recommended you make the following change in your medication:  START: isosorbide mononitrate (Imdur) 20 mg by mouth once daily  REFILLED: Nitroglycerin 0.4 mg under your tongue as needed for Chest Pain  If you have chest pain place 1 tablet under your tongue, wait 5 min If chest pain continues place a 2nd tablet under your tongue, wait 5 min If chest pain continues place a 3rd tablet under your tongue, wait 5 min If chest pain continues call 911  *If you need a refill on your cardiac medications before your next appointment, please call your pharmacy*   Lab Work: NONE If you have labs (blood work) drawn today and your tests are completely normal, you will receive your results only by: [place] (if you have MyChart) OR A paper copy in the mail If you have any lab test that is abnormal or we need to change your treatment, we will call you to review the results.   Testing/Procedures: NONE   Follow-Up: At [facility], you and your health needs are our priority.  As part of our continuing mission to provide you with exceptional heart care, we have created designated Provider Care Teams.  These Care Teams include your primary Cardiologist (physician) and Advanced Practice Providers (APPs -  Physician Assistants and Nurse Practitioners) who all work together to provide you with the care you need, when you need it.  We recommend signing up for the patient portal called "MyChart".  Sign up information is provided on this After Visit Summary.  MyChart is used to connect with patients for Virtual Visits (Telemedicine).  Patients are able to view lab/test results, encounter notes, upcoming appointments, etc.  Non-urgent messages can be sent to your provider as well.   To learn more about what you can do with MyChart, go to [email or web address].    Your next appointment:   6-8 week(s)  The format for your next appointment:   In  Person  Provider:   None  or Dayna Dunn, PA-C, Ermalinda Barrios, PA-C, or Christen Bame, NP       Other Instructions Provided you with information for financial assistance.  Important Information About Sugar

## 2022-07-07 NOTE — Telephone Encounter (Signed)
H&V Care Navigation CSW Progress Note  Clinical Social Worker contacted patient by phone to f/u after appt with [facility] this morning. Received referral from RN team. I called pt at [phone]. Pt answered, states he is currently eating lunch. Will be available post 1:30pm. I will call at that time.   Patient is participating in a Managed Medicaid Plan:  No, self pay only.   SDOH Screenings   Tobacco Use: Medium Risk (07/07/2022)    Westley Hummer, MSW, [place]  [phone]- work cell phone (preferred) [phone]- desk phone

## 2022-07-07 NOTE — Progress Notes (Signed)
Heart and Vascular Care Navigation  07/07/2022  Leon Allen 1966-04-08 [id number]  Reason for Referral: Uninsured, No PCP Patient is participating in a Managed Medicaid Plan: No, self pay only.   Engaged with patient by telephone for initial visit for Heart and Vascular Care Coordination.                                                                                                   Assessment:  LCSW reached out to pt via telephone again this afternoon at [phone]. Was able to reach pt, re-introduced self, role, reason for call. Pt confirmed home address, lives alone, and confirmed his sister and daughter remain his emergency contacts. Pt is currently unemployed, he last worked about 2 years ago but the company was sold and he lost his job. Pt does not have a PCP or any current coverage. Pt shares that his sister and friend assist him with bills as needed. He does not have any issues with transportation and does not receive any SNAP assistance at this time. Pt interested in assistance as eligible. I explained how CAFA and [employer] work. He also is interested in [facility] application. I will send these along with my card and FNS card for [place] for assistance w/ SNAP application. Pt denies any additional questions/concerns at this time and will contact this Probation officer with any additional questions/for assistance w/ completing or submitting applications.                                     HRT/VAS Care Coordination     Patients Home Cardiology Office [facility]   Outpatient Care Team Social Worker   Social Worker Name: Valeda Malm, [place] [phone]   Living arrangements for the past 2 months Single Family Home   Lives with: Self   Patient Current Insurance Coverage Self-Pay   Patient Has Concern With Paying Medical Bills Yes   Patient Concerns With Medical Bills no coverage, ongoing medical w/u   Medical Bill Referrals: CAFA,  [place] [facility]   Does Patient Have Prescription Coverage? No   Patient Prescription Assistance Programs [place] [facility]   [place] [facility] Medications mailed application   Home Assistive Devices/Equipment None   DME Agency NA   [facility] Agency NA       Social History:                                                                             [place]: No Food Insecurity (07/07/2022)  Housing: Low Risk  (07/07/2022)  Transportation Needs: No Transportation Needs (07/07/2022)  Utilities: Not At Risk (07/07/2022)  Financial Resource Strain: High Risk (07/07/2022)  Tobacco Use: Medium  Risk (07/07/2022)    SDOH Interventions: Financial Resources:  Financial Strain Interventions: Other (Comment) (pt family assists currently with costs of living; mailed [employer] application, SNAP card, CAFA and [employer]) Occupational hygienist for [place] Insecurity:  Food Insecurity Interventions: Other (Comment) (SNAP FNS card from [employer])  Housing Insecurity:  Housing Interventions: Intervention Not Indicated (pt sister assists w/ costs of living)  Transportation:   Transportation Interventions: Intervention Not Indicated    Other Care Navigation Interventions:     Provided Pharmacy assistance resources [place] [facility]   Follow-up plan:   LCSW will mail pt the following: my card, CAFA, [employer], [place], [employer] assistance card. I will also coordinate with Opal Sidles, [facility], at [facility] to re-establish pt at clinic. I remain available, will f/u with pt to ensure applications received and answer additional questions/concerns.

## 2022-07-09 NOTE — Addendum Note (Signed)
Addended by: Drue Novel I on: 07/09/2022 07:01 PM   Modules accepted: Orders

## 2022-07-15 ENCOUNTER — Telehealth: Payer: Self-pay | Admitting: Licensed Clinical Social Worker

## 2022-07-15 NOTE — Telephone Encounter (Signed)
H&V Care Navigation CSW Progress Note  Clinical Social Worker contacted patient by phone to f/u on resources sent as well as assistance applications. No answer at [phone], left voicemail requesting return call. I also left information about upcoming PCP appt requested at [facility]. I will re-attempt pt as able.   Patient is participating in a Managed Medicaid Plan:  No, self pay only   [place]: No Food Insecurity (07/07/2022)  Housing: Low Risk  (07/07/2022)  Transportation Needs: No Transportation Needs (07/07/2022)  Utilities: Not At Risk (07/07/2022)  Financial Resource Strain: High Risk (07/07/2022)  Tobacco Use: Medium Risk (07/07/2022)    Westley Hummer, MSW, [place]  [phone]- work cell phone (preferred) [phone]- desk phone

## 2022-07-21 ENCOUNTER — Telehealth: Payer: Self-pay | Admitting: Licensed Clinical Social Worker

## 2022-07-21 NOTE — Telephone Encounter (Signed)
H&V Care Navigation CSW Progress Note  Clinical Social Worker contacted patient by phone to f/u on resources sent as well as assistance applications. No answer again at [phone], left third voicemail requesting return call. Since I have left three messages w/ no call back I will not actively follow at this time. I remain available as needed, pt has been sent assistance applications, my card and arranged a PCP appt.    Patient is participating in a Managed Medicaid Plan:  No, self pay only   [place]: No Food Insecurity (07/07/2022)  Housing: Low Risk  (07/07/2022)  Transportation Needs: No Transportation Needs (07/07/2022)  Utilities: Not At Risk (07/07/2022)  Financial Resource Strain: High Risk (07/07/2022)  Tobacco Use: Medium Risk (07/07/2022)   Westley Hummer, MSW, [place]  [phone]- work cell phone (preferred) [phone]- desk phone

## 2022-08-06 ENCOUNTER — Telehealth: Payer: Self-pay | Admitting: Internal Medicine

## 2022-08-06 NOTE — Telephone Encounter (Signed)
Left a message to call back.

## 2022-08-06 NOTE — Telephone Encounter (Signed)
Called pt back.  Pt would like a refill of lisinopril.  Pt concerned that Imdur may need to be refilled sooner. Pt has been taking Imdur 30 mg 1 tablet AM, 1/2 tablet lunch and 1 tablet at bedtime.  Advised pt med is ordered 30 mg PO QD.  Reports was having chest pain so decided to take medication as mentioned above.   Reports the last week has only taken Imdur 30 in the AM and PM.  Notes CP at bedtime.   Does not check BP denies HA, fatigue and dizziness.  Pt has not taken NTG since starting Imdur.  Advise pt to take medication as ordered.  Can come in for OV tomorrow if needed.  Will send a message to MD to review.

## 2022-08-06 NOTE — Telephone Encounter (Signed)
Pt came in thinking he had an appointment for today, I reassured him that his appointment wasn't today but instead it Is 11/22, pt had concerns about running out of his medicine before he was able to get in with provider. Pt expressed that he was in more pain than normal and was taking about 3-4 tablets of his NitroGLYCERIN a day. Pt wanted to feel more at ease byt talking to a nurse about his symptoms.   Please advise the best number for the pt is [phone])

## 2022-08-06 NOTE — Telephone Encounter (Signed)
Patient returning call.

## 2022-08-07 NOTE — Telephone Encounter (Signed)
Called pt offered OV today or tomorrow.  Pt will come in on 08/08/22 at 10:20 am.  Will discuss medication changes at that time.  Pt expressed that has only taken imdur 30 today and will see if needs an additional dose tonight.

## 2022-08-08 ENCOUNTER — Ambulatory Visit: Payer: Self-pay | Attending: Internal Medicine | Admitting: Internal Medicine

## 2022-08-08 ENCOUNTER — Encounter: Payer: Self-pay | Admitting: Internal Medicine

## 2022-08-08 VITALS — BP 122/74 | HR 63 | Ht 70.0 in | Wt 174.8 lb

## 2022-08-08 DIAGNOSIS — R0789 Other chest pain: Secondary | ICD-10-CM

## 2022-08-08 DIAGNOSIS — I25110 Atherosclerotic heart disease of native coronary artery with unstable angina pectoris: Secondary | ICD-10-CM

## 2022-08-08 DIAGNOSIS — Z87891 Personal history of nicotine dependence: Secondary | ICD-10-CM

## 2022-08-08 DIAGNOSIS — I10 Essential (primary) hypertension: Secondary | ICD-10-CM | POA: Insufficient documentation

## 2022-08-08 DIAGNOSIS — Z8673 Personal history of transient ischemic attack (TIA), and cerebral infarction without residual deficits: Secondary | ICD-10-CM

## 2022-08-08 LAB — BASIC METABOLIC PANEL
BUN/Creatinine Ratio: 24 — ABNORMAL HIGH (ref 9–20)
CO2: 23 mmol/L (ref 20–29)
Potassium: 4.2 mmol/L (ref 3.5–5.2)

## 2022-08-08 LAB — CBC

## 2022-08-08 MED ORDER — ISOSORBIDE MONONITRATE ER 60 MG PO TB24: 90.0000 mg | ORAL_TABLET | Freq: Every day | ORAL | 3 refills | Status: DC

## 2022-08-08 MED ORDER — METOPROLOL SUCCINATE ER 25 MG PO TB24: 25.0000 mg | ORAL_TABLET | Freq: Every day | ORAL | 3 refills | Status: DC

## 2022-08-08 MED ORDER — LISINOPRIL 5 MG PO TABS: 5.0000 mg | ORAL_TABLET | Freq: Every day | ORAL | 3 refills | Status: DC

## 2022-08-08 NOTE — Patient Instructions (Addendum)
Medication Instructions:  Your physician has recommended you make the following change in your medication:  RESTART: metoprolol succinate 25 mg by mouth once daily  INCREASE: isosorbide mononitrate (Imdur) to 90 mg by mouth once daily  REFILLED: lisinopril 5 mg to be taken once daily by mouth   *If you need a refill on your cardiac medications before your next appointment, please call your pharmacy*  http://www.needymeds.org/papforms/[email or web address]  Lab Work: TODAY: BMP, CBC  If you have labs (blood work) drawn today and your tests are completely normal, you will receive your results only by: [place] [facility] (if you have MyChart) OR A paper copy in the mail If you have any lab test that is abnormal or we need to change your treatment, we will call you to review the results.   Testing/Procedures: NONE   Follow-Up:As scheduled At [facility], you and your health needs are our priority.  As part of our continuing mission to provide you with exceptional heart care, we have created designated Provider Care Teams.  These Care Teams include your primary Cardiologist (physician) and Advanced Practice Providers (APPs -  Physician Assistants and Nurse Practitioners) who all work together to provide you with the care you need, when you need it.    The format for your next appointment:   In Person  Provider:   Rudean Haskell, MD       Other Instructions   You are scheduled for a Cardiac Catheterization on , TO BE DETERMINED.  1. Please arrive at the [facility] (Main Entrance A) at [facility]: [address] [place], [place] [address] at [place]. (This time is two hours before your procedure to ensure your preparation). Free valet parking service is available.   Special note: Every effort is made to have your procedure done on time. Please understand that emergencies sometimes delay scheduled procedures.  2. Diet: Do not eat solid foods after  midnight.  The patient may have clear liquids until 5am upon the day of the procedure.  3. Labs: You will need to have blood drawn TODAY.  4. Medication instructions in preparation for your procedure:   Contrast Allergy: No   On the morning of your procedure, take your Aspirin 81 mg and any morning medicines NOT listed above.  You may use sips of water.  5. Plan for one night stay--bring personal belongings. 6. Bring a current list of your medications and current insurance cards. 7. You MUST have a responsible person to drive you home. 8. Someone MUST be with you the first 24 hours after you arrive home or your discharge will be delayed. 9. Please wear clothes that are easy to get on and off and wear slip-on shoes.  Thank you for allowing [place] to care for you!   -- [place] [facility] Cardi[facility] services   Important Information About Sugar

## 2022-08-08 NOTE — Progress Notes (Signed)
Cardiology Office Note:    Date:  08/08/2022   ID:  Leon Allen, DOB [birth year], MRN [id number]  PCP:  No primary care provider on file.   [place] Providers Cardiologist:  None     Referring MD: No ref. provider found   CC: Return of angina  History of Present Illness:    Leon Allen is a 56 y.o. male with a hx of CAD with prior mLAD PCI, prior tobacco and marijuana use.   2023.  Since last visit stopped smoking Marijuana.  Has a community medicine visit on Monday and may be able to proceed with cath.  Cost was $1500 per our billing department.  Chest pain has improved but is still presents. Could not afford statin or BB. Is taking 60 mg Imdur.  Is taking ASA X3.  Is eating peaches to deal with kidney pain from the IMDUR   No SOB/DOE and no PND/Orthopnea.  No weight gain or leg swelling.  No palpitations or syncope.  Past Medical History:  Diagnosis Date   Adrenal adenoma    bilateral   Complication of anesthesia    "@ dentist office; was overdosed"   Coronary artery disease    CVA (cerebral vascular accident) ([place]) 2015   denies residual on 01/17/2016   Daily headache    "lately" (01/17/2016)   Gonorrhea 2015   Heart attack ([place]) 2005   Hematuria 12/2015   Kidney stone    Situational depression    "old girlfriend missing"    Past Surgical History:  Procedure Laterality Date   APPENDECTOMY     CORONARY ANGIOPLASTY WITH STENT PLACEMENT  2005   INGUINAL HERNIA REPAIR Right    LITHOTRIPSY  X 2    Current Medications: Current Meds  Medication Sig   Ascorbic Acid (VITAMIN C PO) Take 4 tablets by mouth daily.   aspirin 81 MG chewable tablet Chew 1 tablet (81 mg total) by mouth daily. (Patient taking differently: Chew 3 tablets by mouth daily.)   Cyanocobalamin (VITAMIN B-12 PO) Take 4 tablets by mouth daily.   isosorbide mononitrate (IMDUR) 60 MG 24 hr tablet Take 1.5 tablets (90 mg total) by mouth daily.   nitroGLYCERIN (NITROSTAT) 0.4 MG SL  tablet Place 1 tablet (0.4 mg total) under the tongue every 5 (five) minutes as needed for chest pain.   Omega-3 Fatty Acids (FISH OIL) 1000 MG CAPS Take 3,000 mg by mouth daily.   [DISCONTINUED] isosorbide mononitrate (IMDUR) 30 MG 24 hr tablet Take 1 tablet (30 mg total) by mouth daily.   [DISCONTINUED] lisinopril (ZESTRIL) 5 MG tablet Take 5 mg by mouth daily.     Allergies:   Bee venom and Penicillins   Social History   Socioeconomic History   Marital status: Divorced    Spouse name: Not on file   Number of children: Not on file   Years of education: Not on file   Highest education level: Not on file  Occupational History   Not on file  Tobacco Use   Smoking status: Former    Packs/day: 3.00    Years: 40.00    Total pack years: 120.00    Types: Cigarettes    Quit date: 10/21/2015    Years since quitting: 6.8   Smokeless tobacco: Former    Types: Chew   Tobacco comments:    "chewed some when I was young; not for long"  Substance and Sexual Activity   Alcohol use: Yes    Alcohol/week:  0.0 standard drinks of alcohol    Comment: 01/17/2016 "I'll drink beer or a mixed drink a few times/year"   Drug use: Yes    Types: Marijuana    Comment: 01/17/2016 "stopped marijuana 10/21/2015"   Sexual activity: Yes  Other Topics Concern   Not on file  Social History Narrative   Not on file   Social Determinants of Health   Financial Resource Strain: High Risk (07/07/2022)   Overall Financial Resource Strain (CARDIA)    Difficulty of Paying Living Expenses: Hard  Food Insecurity: No Food Insecurity (07/07/2022)   Hunger Vital Sign    Worried About Running Out of Food in the Last Year: Never true    Ran Out of Food in the Last Year: Never true  Transportation Needs: No Transportation Needs (07/07/2022)   PRAPARE - Hydrologist (Medical): No    Lack of Transportation (Non-Medical): No  Physical Activity: Not on file  Stress: Not on file  Social Connections:  Not on file    Family History: The patient's family history includes Cancer in his mother.  ROS:   Please see the history of present illness.     All other systems reviewed and are negative.  EKGs/Labs/Other Studies Reviewed:    The following studies were reviewed today:  EKG:  EKG is  ordered today.  The ekg ordered today demonstrates  08/08/22: SR rate 63 07/07/22: SR rate 64  Transthoracic Echocardiogram: Date: 2017 Report Results: - Left ventricle: The cavity size was normal. Systolic function was    normal. The estimated ejection fraction was in the range of 55%    to 60%. Wall motion was normal; there were no regional wall    motion abnormalities. Left ventricular diastolic function    parameters were normal.  - Aortic valve: Trileaflet; normal thickness leaflets. There was no    regurgitation.  - Aortic root: The aortic root was normal in size.  - Mitral valve: There was no regurgitation.  - Left atrium: The atrium was normal in size.  - Right ventricle: The cavity size was normal. Wall thickness was    normal. Systolic function was normal.  - Right atrium: The atrium was normal in size.  - Tricuspid valve: There was trivial regurgitation.  - Pulmonic valve: There was no regurgitation.  - Pulmonary arteries: The main pulmonary artery was normal-sized.    Systolic pressure was within the normal range.  - Inferior vena cava: The vessel was normal in size.  - Pericardium, extracardiac: There was no pericardial effusion.   Recent Labs: No results found for requested labs within last 365 days.  Recent Lipid Panel    Component Value Date/Time   CHOL 132 01/17/2016 1954   TRIG 60 01/17/2016 1954   HDL 34 (L) 01/17/2016 1954   CHOLHDL 3.9 01/17/2016 1954   VLDL 12 01/17/2016 1954   Whitney 86 01/17/2016 1954    Physical Exam:    VS:  BP 122/74   Pulse 63   Ht '5\' 10"'$  (1.778 m)   Wt 174 lb 12.8 oz (79.3 kg)   SpO2 97%   BMI 25.08 kg/m     Wt Readings from  Last 3 Encounters:  08/08/22 174 lb 12.8 oz (79.3 kg)  07/07/22 173 lb (78.5 kg)  03/03/16 185 lb 12.8 oz (84.3 kg)    Gen: No distress    Neck: No JVD, no carotid bruit Cardiac: No Rubs or Gallops, no murmur, RRR +2 radial  pulses Respiratory: Clear to auscultation bilaterally, normal effort, normal  respiratory rate GI: Soft, nontender, non-distended  MS: No edema;  moves all extremities Integument: Skin shows evidence of lower extremity skin changes Neuro:  At time of evaluation, alert and oriented to person/place/time/situation  Psych: Normal affect, patient feels warm  ASSESSMENT:    1. Coronary artery disease involving native coronary artery of native heart with unstable angina pectoris ([place])   2. Other chest pain   3. History of tobacco abuse   4. History of CVA (cerebrovascular accident)   5. Essential hypertension     PLAN:    Coronary Artery Disease HTN Former Poly-substance abuse, hx of CVA Now down to just tobacco abuse; improved since last visit. HTN - symptomatic  - anatomy: prior mid LAD PCI - continue ASA 81 mg - continue statin, goal LDL < 55; gave him info on a drug prescription benefit plan - restart  succinate 25 mg PO daily - will give PRN Nitro - increase to 90 mg Imdur - refill lisinopril - labs today; once he gets cleared Monday for the the financial assist, he will call us (Tuesday) and we will set up for cath  Risks and benefits of cardiac catheterization have been discussed with the patient.  These include bleeding, infection, kidney damage, stroke, heart attack, death.  The patient understands these risks and is willing to proceed.  Access recommendations: R radial Procedural considerations no issues    Post cath f/u       Medication Adjustments/Labs and Tests Ordered: Current medicines are reviewed at length with the patient today.  Concerns regarding medicines are outlined above.  Orders Placed This Encounter  Procedures   CBC    Basic metabolic panel   EKG 60-VPXT   Meds ordered this encounter  Medications   metoprolol succinate (TOPROL-XL) 25 MG 24 hr tablet    Sig: Take 1 tablet (25 mg total) by mouth daily.    Dispense:  90 tablet    Refill:  3   isosorbide mononitrate (IMDUR) 60 MG 24 hr tablet    Sig: Take 1.5 tablets (90 mg total) by mouth daily.    Dispense:  135 tablet    Refill:  3   lisinopril (ZESTRIL) 5 MG tablet    Sig: Take 1 tablet (5 mg total) by mouth daily.    Dispense:  90 tablet    Refill:  3    Patient Instructions  Medication Instructions:  Your physician has recommended you make the following change in your medication:  RESTART: metoprolol succinate 25 mg by mouth once daily  INCREASE: isosorbide mononitrate (Imdur) to 90 mg by mouth once daily *If you need a refill on your cardiac medications before your next appointment, please call your pharmacy*  http://www.needymeds.org/papforms/[email or web address].pdf  Lab Work: TODAY: BMP, CBC  If you have labs (blood work) drawn today and your tests are completely normal, you will receive your results only by: [place] (if you have MyChart) OR A paper copy in the mail If you have any lab test that is abnormal or we need to change your treatment, we will call you to review the results.   Testing/Procedures: NONE   Follow-Up:As scheduled At [facility], you and your health needs are our priority.  As part of our continuing mission to provide you with exceptional heart care, we have created designated Provider Care Teams.  These Care Teams include your primary Cardiologist (physician) and Advanced Practice Providers (APPs -  Physician  Assistants and Nurse Practitioners) who all work together to provide you with the care you need, when you need it.    The format for your next appointment:   In Person  Provider:   Rudean Haskell, MD       Other Instructions   You are scheduled for a Cardiac Catheterization on ,  TO BE DETERMINED.  1. Please arrive at the [facility] (Main Entrance A) at [facility]: [address] [place], [place] [address] at [place]. (This time is two hours before your procedure to ensure your preparation). Free valet parking service is available.   Special note: Every effort is made to have your procedure done on time. Please understand that emergencies sometimes delay scheduled procedures.  2. Diet: Do not eat solid foods after midnight.  The patient may have clear liquids until 5am upon the day of the procedure.  3. Labs: You will need to have blood drawn TODAY.  4. Medication instructions in preparation for your procedure:   Contrast Allergy: No   On the morning of your procedure, take your Aspirin 81 mg and any morning medicines NOT listed above.  You may use sips of water.  5. Plan for one night stay--bring personal belongings. 6. Bring a current list of your medications and current insurance cards. 7. You MUST have a responsible person to drive you home. 8. Someone MUST be with you the first 24 hours after you arrive home or your discharge will be delayed. 9. Please wear clothes that are easy to get on and off and wear slip-on shoes.  Thank you for allowing [place] to care for you!   -- [place] Invasive Cardiovascular services   Important Information About Sugar         Signed, Werner Lean, MD  08/08/2022 11:03 AM    [place]

## 2022-08-08 NOTE — H&P (View-Only) (Signed)
Cardiology Office Note:    Date:  08/08/2022   ID:  Leon Allen, DOB [birth year], MRN [id number]  PCP:  No primary care provider on file.   [facility] Providers Cardiologist:  None     Referring MD: No ref. provider found   CC: Return of angina  History of Present Illness:    Leon Allen is a 56 y.o. male with a hx of CAD with prior mLAD PCI, prior tobacco and marijuana use.   2023.  Since last visit stopped smoking Marijuana.  Has a community medicine visit on Monday and may be able to proceed with cath.  Cost was $1500 per our billing department.  Chest pain has improved but is still presents. Could not afford statin or BB. Is taking 60 mg Imdur.  Is taking ASA X3.  Is eating peaches to deal with kidney pain from the IMDUR   No SOB/DOE and no PND/Orthopnea.  No weight gain or leg swelling.  No palpitations or syncope.  Past Medical History:  Diagnosis Date   Adrenal adenoma    bilateral   Complication of anesthesia    "@ dentist office; was overdosed"   Coronary artery disease    CVA (cerebral vascular accident) ([place]) 2015   denies residual on 01/17/2016   Daily headache    "lately" (01/17/2016)   Gonorrhea 2015   Heart attack ([place]) 2005   Hematuria 12/2015   Kidney stone    Situational depression    "old girlfriend missing"    Past Surgical History:  Procedure Laterality Date   APPENDECTOMY     CORONARY ANGIOPLASTY WITH STENT PLACEMENT  2005   INGUINAL HERNIA REPAIR Right    LITHOTRIPSY  X 2    Current Medications: Current Meds  Medication Sig   Ascorbic Acid (VITAMIN C PO) Take 4 tablets by mouth daily.   aspirin 81 MG chewable tablet Chew 1 tablet (81 mg total) by mouth daily. (Patient taking differently: Chew 3 tablets by mouth daily.)   Cyanocobalamin (VITAMIN B-12 PO) Take 4 tablets by mouth daily.   isosorbide mononitrate (IMDUR) 60 MG 24 hr tablet Take 1.5 tablets (90 mg total) by mouth daily.   nitroGLYCERIN (NITROSTAT) 0.4 MG SL  tablet Place 1 tablet (0.4 mg total) under the tongue every 5 (five) minutes as needed for chest pain.   Omega-3 Fatty Acids (FISH OIL) 1000 MG CAPS Take 3,000 mg by mouth daily.   [DISCONTINUED] isosorbide mononitrate (IMDUR) 30 MG 24 hr tablet Take 1 tablet (30 mg total) by mouth daily.   [DISCONTINUED] lisinopril (ZESTRIL) 5 MG tablet Take 5 mg by mouth daily.     Allergies:   Bee venom and Penicillins   Social History   Socioeconomic History   Marital status: Divorced    Spouse name: Not on file   Number of children: Not on file   Years of education: Not on file   Highest education level: Not on file  Occupational History   Not on file  Tobacco Use   Smoking status: Former    Packs/day: 3.00    Years: 40.00    Total pack years: 120.00    Types: Cigarettes    Quit date: 10/21/2015    Years since quitting: 6.8   Smokeless tobacco: Former    Types: Chew   Tobacco comments:    "chewed some when I was young; not for long"  Substance and Sexual Activity   Alcohol use: Yes    Alcohol/week:  0.0 standard drinks of alcohol    Comment: 01/17/2016 "I'll drink beer or a mixed drink a few times/year"   Drug use: Yes    Types: Marijuana    Comment: 01/17/2016 "stopped marijuana 10/21/2015"   Sexual activity: Yes  Other Topics Concern   Not on file  Social History Narrative   Not on file   Social Determinants of Health   Financial Resource Strain: High Risk (07/07/2022)   Overall Financial Resource Strain (CARDIA)    Difficulty of Paying Living Expenses: Hard  Food Insecurity: No Food Insecurity (07/07/2022)   Hunger Vital Sign    Worried About Running Out of Food in the Last Year: Never true    Ran Out of Food in the Last Year: Never true  Transportation Needs: No Transportation Needs (07/07/2022)   PRAPARE - Hydrologist (Medical): No    Lack of Transportation (Non-Medical): No  Physical Activity: Not on file  Stress: Not on file  Social Connections:  Not on file    Family History: The patient's family history includes Cancer in his mother.  ROS:   Please see the history of present illness.     All other systems reviewed and are negative.  EKGs/Labs/Other Studies Reviewed:    The following studies were reviewed today:  EKG:  EKG is  ordered today.  The ekg ordered today demonstrates  08/08/22: SR rate 63 07/07/22: SR rate 64  Transthoracic Echocardiogram: Date: 2017 Report Results: - Left ventricle: The cavity size was normal. Systolic function was    normal. The estimated ejection fraction was in the range of 55%    to 60%. Wall motion was normal; there were no regional wall    motion abnormalities. Left ventricular diastolic function    parameters were normal.  - Aortic valve: Trileaflet; normal thickness leaflets. There was no    regurgitation.  - Aortic root: The aortic root was normal in size.  - Mitral valve: There was no regurgitation.  - Left atrium: The atrium was normal in size.  - Right ventricle: The cavity size was normal. Wall thickness was    normal. Systolic function was normal.  - Right atrium: The atrium was normal in size.  - Tricuspid valve: There was trivial regurgitation.  - Pulmonic valve: There was no regurgitation.  - Pulmonary arteries: The main pulmonary artery was normal-sized.    Systolic pressure was within the normal range.  - Inferior vena cava: The vessel was normal in size.  - Pericardium, extracardiac: There was no pericardial effusion.   Recent Labs: No results found for requested labs within last 365 days.  Recent Lipid Panel    Component Value Date/Time   CHOL 132 01/17/2016 1954   TRIG 60 01/17/2016 1954   HDL 34 (L) 01/17/2016 1954   CHOLHDL 3.9 01/17/2016 1954   VLDL 12 01/17/2016 1954   [place] 86 01/17/2016 1954    Physical Exam:    VS:  BP 122/74   Pulse 63   Ht '5\' 10"'$  (1.778 m)   Wt 174 lb 12.8 oz (79.3 kg)   SpO2 97%   BMI 25.08 kg/m     Wt Readings from  Last 3 Encounters:  08/08/22 174 lb 12.8 oz (79.3 kg)  07/07/22 173 lb (78.5 kg)  03/03/16 185 lb 12.8 oz (84.3 kg)    Gen: No distress    Neck: No JVD, no carotid bruit Cardiac: No Rubs or Gallops, no murmur, RRR +2 radial  pulses Respiratory: Clear to auscultation bilaterally, normal effort, normal  respiratory rate GI: Soft, nontender, non-distended  MS: No edema;  moves all extremities Integument: Skin shows evidence of lower extremity skin changes Neuro:  At time of evaluation, alert and oriented to person/place/time/situation  Psych: Normal affect, patient feels warm  ASSESSMENT:    1. Coronary artery disease involving native coronary artery of native heart with unstable angina pectoris ([place])   2. Other chest pain   3. History of tobacco abuse   4. History of CVA (cerebrovascular accident)   5. Essential hypertension     PLAN:    Coronary Artery Disease HTN Former Poly-substance abuse, hx of CVA Now down to just tobacco abuse; improved since last visit. HTN - symptomatic  - anatomy: prior mid LAD PCI - continue ASA 81 mg - continue statin, goal LDL < 55; gave him info on a drug prescription benefit plan - restart  succinate 25 mg PO daily - will give PRN Nitro - increase to 90 mg Imdur - refill lisinopril - labs today; once he gets cleared Monday for the the financial assist, he will call us (Tuesday) and we will set up for cath  Risks and benefits of cardiac catheterization have been discussed with the patient.  These include bleeding, infection, kidney damage, stroke, heart attack, death.  The patient understands these risks and is willing to proceed.  Access recommendations: R radial Procedural considerations no issues    Post cath f/u       Medication Adjustments/Labs and Tests Ordered: Current medicines are reviewed at length with the patient today.  Concerns regarding medicines are outlined above.  Orders Placed This Encounter  Procedures   CBC    Basic metabolic panel   EKG 95-MWUX   Meds ordered this encounter  Medications   metoprolol succinate (TOPROL-XL) 25 MG 24 hr tablet    Sig: Take 1 tablet (25 mg total) by mouth daily.    Dispense:  90 tablet    Refill:  3   isosorbide mononitrate (IMDUR) 60 MG 24 hr tablet    Sig: Take 1.5 tablets (90 mg total) by mouth daily.    Dispense:  135 tablet    Refill:  3   lisinopril (ZESTRIL) 5 MG tablet    Sig: Take 1 tablet (5 mg total) by mouth daily.    Dispense:  90 tablet    Refill:  3    Patient Instructions  Medication Instructions:  Your physician has recommended you make the following change in your medication:  RESTART: metoprolol succinate 25 mg by mouth once daily  INCREASE: isosorbide mononitrate (Imdur) to 90 mg by mouth once daily *If you need a refill on your cardiac medications before your next appointment, please call your pharmacy*  http://www.needymeds.org/papforms/[email or web address].pdf  Lab Work: TODAY: BMP, CBC  If you have labs (blood work) drawn today and your tests are completely normal, you will receive your results only by: [employer] (if you have MyChart) OR A paper copy in the mail If you have any lab test that is abnormal or we need to change your treatment, we will call you to review the results.   Testing/Procedures: NONE   Follow-Up:As scheduled At [facility], you and your health needs are our priority.  As part of our continuing mission to provide you with exceptional heart care, we have created designated Provider Care Teams.  These Care Teams include your primary Cardiologist (physician) and Advanced Practice Providers (APPs -  Physician  Assistants and Nurse Practitioners) who all work together to provide you with the care you need, when you need it.    The format for your next appointment:   In Person  Provider:   Rudean Haskell, MD       Other Instructions   You are scheduled for a Cardiac Catheterization on ,  TO BE DETERMINED.  1. Please arrive at the [facility] (Main Entrance A) at [facility]: [address] [place], [place] [address] at [place]. (This time is two hours before your procedure to ensure your preparation). Free valet parking service is available.   Special note: Every effort is made to have your procedure done on time. Please understand that emergencies sometimes delay scheduled procedures.  2. Diet: Do not eat solid foods after midnight.  The patient may have clear liquids until 5am upon the day of the procedure.  3. Labs: You will need to have blood drawn TODAY.  4. Medication instructions in preparation for your procedure:   Contrast Allergy: No   On the morning of your procedure, take your Aspirin 81 mg and any morning medicines NOT listed above.  You may use sips of water.  5. Plan for one night stay--bring personal belongings. 6. Bring a current list of your medications and current insurance cards. 7. You MUST have a responsible person to drive you home. 8. Someone MUST be with you the first 24 hours after you arrive home or your discharge will be delayed. 9. Please wear clothes that are easy to get on and off and wear slip-on shoes.  Thank you for allowing [place] to care for you!   -- [facility]   Important Information About Sugar         Signed, Werner Lean, MD  08/08/2022 11:03 AM    [place]

## 2022-08-09 LAB — BASIC METABOLIC PANEL
BUN: 21 mg/dL (ref 6–24)
Calcium: 9.6 mg/dL (ref 8.7–10.2)
Chloride: 104 mmol/L (ref 96–106)
Creatinine, Ser: 0.86 mg/dL (ref 0.76–1.27)
Glucose: 99 mg/dL (ref 70–99)
Sodium: 139 mmol/L (ref 134–144)
eGFR: 101 mL/min/{1.73_m2} (ref >59)

## 2022-08-09 LAB — CBC
Hemoglobin: 14 g/dL (ref 13.0–17.7)
MCH: 30.9 pg (ref 26.6–33.0)
MCHC: 32.7 g/dL (ref 31.5–35.7)
MCV: 95 fL (ref 79–97)
RDW: 11.9 % (ref 11.6–15.4)
WBC: 7 10*3/uL (ref 3.4–10.8)

## 2022-08-11 ENCOUNTER — Telehealth: Payer: Self-pay

## 2022-08-11 ENCOUNTER — Ambulatory Visit: Payer: Self-pay | Admitting: Nurse Practitioner

## 2022-08-11 MED ORDER — SODIUM CHLORIDE 0.9% FLUSH: 3.0000 mL | Freq: Two times a day (BID) | INTRAVENOUS | Status: DC

## 2022-08-11 NOTE — Telephone Encounter (Signed)
Called pt asked what day works for heart cath.  Pt reports Wednesday Aug 13, 2022. Called Cath lab scheduled for 2PM 08/13/22. Called pt advised of timing.  Pt does not need me to review cath instructions again.  Reviewed instructions on 08/08/22.  Advised pt to call our office with questions/ concerns.

## 2022-08-11 NOTE — Addendum Note (Signed)
Addended by: Rudean Haskell A on: 08/11/2022 01:16 PM   Modules accepted: Orders

## 2022-08-12 ENCOUNTER — Telehealth: Payer: Self-pay | Admitting: *Deleted

## 2022-08-12 NOTE — Telephone Encounter (Signed)
Cardiac Catheterization scheduled at [facility] for: Wednesday August 13, 2022 2 PM Arrival time and place: [place] Entrance A at: 50 Noon  Nothing to eat after midnight prior to procedure, clear liquids until 5 AM day of procedure.  Medication instructions: -Usual morning medications can be taken with sips of water including aspirin 81 mg.  Confirmed patient has responsible adult to drive home post procedure and be with patient first 24 hours after arriving home.  Patient reports no new symptoms concerning for COVID-19 in the past 10 days.  Reviewed procedure instructions with patient.

## 2022-08-13 ENCOUNTER — Other Ambulatory Visit: Payer: Self-pay

## 2022-08-13 ENCOUNTER — Encounter (HOSPITAL_COMMUNITY)
Admission: RE | Disposition: A | Discharge status: Home / Self Care | Payer: Self-pay | Source: Ambulatory Visit | Attending: Cardiovascular Disease

## 2022-08-13 ENCOUNTER — Ambulatory Visit (HOSPITAL_COMMUNITY)
Admission: RE | Admit: 2022-08-13 | Discharge: 2022-08-14 | Disposition: A | Discharge status: Home / Self Care | Payer: Self-pay | Source: Ambulatory Visit | Attending: Cardiovascular Disease | Admitting: Cardiovascular Disease

## 2022-08-13 DIAGNOSIS — Z8673 Personal history of transient ischemic attack (TIA), and cerebral infarction without residual deficits: Secondary | ICD-10-CM | POA: Insufficient documentation

## 2022-08-13 DIAGNOSIS — I10 Essential (primary) hypertension: Secondary | ICD-10-CM | POA: Insufficient documentation

## 2022-08-13 DIAGNOSIS — Z955 Presence of coronary angioplasty implant and graft: Secondary | ICD-10-CM | POA: Insufficient documentation

## 2022-08-13 DIAGNOSIS — Y832 Surgical operation with anastomosis, bypass or graft as the cause of abnormal reaction of the patient, or of later complication, without mention of misadventure at the time of the procedure: Secondary | ICD-10-CM | POA: Insufficient documentation

## 2022-08-13 DIAGNOSIS — F1211 Cannabis abuse, in remission: Secondary | ICD-10-CM | POA: Insufficient documentation

## 2022-08-13 DIAGNOSIS — Z7982 Long term (current) use of aspirin: Secondary | ICD-10-CM | POA: Insufficient documentation

## 2022-08-13 DIAGNOSIS — T82855A Stenosis of coronary artery stent, initial encounter: Secondary | ICD-10-CM | POA: Insufficient documentation

## 2022-08-13 DIAGNOSIS — I25110 Atherosclerotic heart disease of native coronary artery with unstable angina pectoris: Principal | ICD-10-CM | POA: Insufficient documentation

## 2022-08-13 DIAGNOSIS — Z79899 Other long term (current) drug therapy: Secondary | ICD-10-CM | POA: Insufficient documentation

## 2022-08-13 DIAGNOSIS — Z87891 Personal history of nicotine dependence: Secondary | ICD-10-CM | POA: Insufficient documentation

## 2022-08-13 DIAGNOSIS — I200 Unstable angina: Secondary | ICD-10-CM | POA: Diagnosis present

## 2022-08-13 HISTORY — PX: LEFT HEART CATH AND CORONARY ANGIOGRAPHY: CATH118249

## 2022-08-13 HISTORY — PX: CORONARY STENT INTERVENTION: CATH118234

## 2022-08-13 LAB — BASIC METABOLIC PANEL
Anion gap: 11 (ref 5–15)
BUN: 20 mg/dL (ref 6–20)
CO2: 23 mmol/L (ref 22–32)
Calcium: 8.9 mg/dL (ref 8.9–10.3)
Chloride: 105 mmol/L (ref 98–111)
Creatinine, Ser: 1.2 mg/dL (ref 0.61–1.24)
GFR, Estimated: >60 mL/min (ref >60)
Glucose, Bld: 111 mg/dL — ABNORMAL HIGH (ref 70–99)
Potassium: 3.7 mmol/L (ref 3.5–5.1)
Sodium: 139 mmol/L (ref 135–145)

## 2022-08-13 LAB — CBC WITH DIFFERENTIAL/PLATELET
Abs Immature Granulocytes: 0.01 10*3/uL (ref 0.00–0.07)
Basophils Absolute: 0.1 10*3/uL (ref 0.0–0.1)
Basophils Relative: 1 %
Eosinophils Absolute: 0.4 10*3/uL (ref 0.0–0.5)
Eosinophils Relative: 5 %
HCT: 38.5 % — ABNORMAL LOW (ref 39.0–52.0)
Hemoglobin: 13.2 g/dL (ref 13.0–17.0)
Immature Granulocytes: 0 %
Lymphocytes Relative: 30 %
Lymphs Abs: 2.3 10*3/uL (ref 0.7–4.0)
MCH: 31.7 pg (ref 26.0–34.0)
MCHC: 34.3 g/dL (ref 30.0–36.0)
MCV: 92.3 fL (ref 80.0–100.0)
Monocytes Absolute: 0.6 10*3/uL (ref 0.1–1.0)
Monocytes Relative: 8 %
Neutro Abs: 4.2 10*3/uL (ref 1.7–7.7)
Neutrophils Relative %: 56 %
Platelets: 205 10*3/uL (ref 150–400)
RBC: 4.17 MIL/uL — ABNORMAL LOW (ref 4.22–5.81)
RDW: 12.1 % (ref 11.5–15.5)
WBC: 7.7 10*3/uL (ref 4.0–10.5)
nRBC: 0 % (ref 0.0–0.2)

## 2022-08-13 LAB — POCT ACTIVATED CLOTTING TIME
Activated Clotting Time: 239 seconds
Activated Clotting Time: 263 seconds

## 2022-08-13 SURGERY — LEFT HEART CATH AND CORONARY ANGIOGRAPHY
Anesthesia: LOCAL

## 2022-08-13 MED ORDER — ISOSORBIDE MONONITRATE ER 60 MG PO TB24
90.0000 mg | ORAL_TABLET | Freq: Every day | ORAL | Status: DC
  Administered 2022-08-14: 90 mg via ORAL
  Filled 2022-08-13: qty 1

## 2022-08-13 MED ORDER — IOHEXOL 350 MG/ML SOLN
INTRAVENOUS | Status: DC | PRN
  Administered 2022-08-13: 110 mL

## 2022-08-13 MED ORDER — FAMOTIDINE IN NACL 20-0.9 MG/50ML-% IV SOLN
INTRAVENOUS | Status: AC
  Filled 2022-08-13: qty 50

## 2022-08-13 MED ORDER — VERAPAMIL HCL 2.5 MG/ML IV SOLN
INTRAVENOUS | Status: DC | PRN
  Administered 2022-08-13: 10 mL via INTRA_ARTERIAL

## 2022-08-13 MED ORDER — NITROGLYCERIN 1 MG/10 ML FOR IR/CATH LAB
INTRA_ARTERIAL | Status: AC
  Filled 2022-08-13: qty 10

## 2022-08-13 MED ORDER — ONDANSETRON HCL 4 MG/2ML IJ SOLN: 4.0000 mg | Freq: Four times a day (QID) | INTRAMUSCULAR | Status: DC | PRN

## 2022-08-13 MED ORDER — MIDAZOLAM HCL 2 MG/2ML IJ SOLN
INTRAMUSCULAR | Status: AC
  Filled 2022-08-13: qty 2

## 2022-08-13 MED ORDER — SODIUM CHLORIDE 0.9 % IV SOLN: 250.0000 mL | INTRAVENOUS | Status: DC | PRN

## 2022-08-13 MED ORDER — CLOPIDOGREL BISULFATE 75 MG PO TABS
75.0000 mg | ORAL_TABLET | Freq: Every day | ORAL | Status: DC
  Administered 2022-08-14: 75 mg via ORAL
  Filled 2022-08-13: qty 1

## 2022-08-13 MED ORDER — HEPARIN SODIUM (PORCINE) 1000 UNIT/ML IJ SOLN
INTRAMUSCULAR | Status: AC
  Filled 2022-08-13: qty 10

## 2022-08-13 MED ORDER — LISINOPRIL 10 MG PO TABS
5.0000 mg | ORAL_TABLET | Freq: Every day | ORAL | Status: DC
  Administered 2022-08-14: 5 mg via ORAL
  Filled 2022-08-13: qty 1

## 2022-08-13 MED ORDER — MIDAZOLAM HCL 2 MG/2ML IJ SOLN
INTRAMUSCULAR | Status: DC | PRN
  Administered 2022-08-13: 2 mg via INTRAVENOUS

## 2022-08-13 MED ORDER — ASPIRIN 81 MG PO CHEW: 81.0000 mg | CHEWABLE_TABLET | ORAL | Status: DC

## 2022-08-13 MED ORDER — LABETALOL HCL 5 MG/ML IV SOLN: 10.0000 mg | INTRAVENOUS | Status: AC | PRN

## 2022-08-13 MED ORDER — SODIUM CHLORIDE 0.9% FLUSH: 3.0000 mL | INTRAVENOUS | Status: DC | PRN

## 2022-08-13 MED ORDER — ATORVASTATIN CALCIUM 80 MG PO TABS
80.0000 mg | ORAL_TABLET | Freq: Every day | ORAL | Status: DC
  Administered 2022-08-14: 80 mg via ORAL
  Filled 2022-08-13: qty 1

## 2022-08-13 MED ORDER — ASPIRIN 81 MG PO CHEW
81.0000 mg | CHEWABLE_TABLET | Freq: Every day | ORAL | Status: DC
  Filled 2022-08-13: qty 1

## 2022-08-13 MED ORDER — SODIUM CHLORIDE 0.9 % WEIGHT BASED INFUSION
1.0000 mL/kg/h | INTRAVENOUS | Status: DC
  Administered 2022-08-13: 1 mL/kg/h via INTRAVENOUS

## 2022-08-13 MED ORDER — SODIUM CHLORIDE 0.9 % IV SOLN: INTRAVENOUS | Status: AC

## 2022-08-13 MED ORDER — MORPHINE SULFATE (PF) 2 MG/ML IV SOLN: 2.0000 mg | INTRAVENOUS | Status: DC | PRN

## 2022-08-13 MED ORDER — SODIUM CHLORIDE 0.9% FLUSH
3.0000 mL | Freq: Two times a day (BID) | INTRAVENOUS | Status: DC
  Administered 2022-08-14: 3 mL via INTRAVENOUS

## 2022-08-13 MED ORDER — METOPROLOL SUCCINATE ER 25 MG PO TB24
25.0000 mg | ORAL_TABLET | Freq: Every day | ORAL | Status: DC
  Administered 2022-08-14: 25 mg via ORAL
  Filled 2022-08-13: qty 1

## 2022-08-13 MED ORDER — SODIUM CHLORIDE 0.9 % WEIGHT BASED INFUSION
3.0000 mL/kg/h | INTRAVENOUS | Status: DC
  Administered 2022-08-13: 3 mL/kg/h via INTRAVENOUS

## 2022-08-13 MED ORDER — CLOPIDOGREL BISULFATE 300 MG PO TABS
ORAL_TABLET | ORAL | Status: DC | PRN
  Administered 2022-08-13: 600 mg via ORAL

## 2022-08-13 MED ORDER — HEPARIN (PORCINE) IN NACL 1000-0.9 UT/500ML-% IV SOLN
INTRAVENOUS | Status: AC
  Filled 2022-08-13: qty 1000

## 2022-08-13 MED ORDER — FENTANYL CITRATE (PF) 100 MCG/2ML IJ SOLN
INTRAMUSCULAR | Status: DC | PRN
  Administered 2022-08-13: 50 ug via INTRAVENOUS

## 2022-08-13 MED ORDER — NITROGLYCERIN 0.4 MG SL SUBL: 0.4000 mg | SUBLINGUAL_TABLET | SUBLINGUAL | Status: DC | PRN

## 2022-08-13 MED ORDER — ACETAMINOPHEN 325 MG PO TABS: 650.0000 mg | ORAL_TABLET | ORAL | Status: DC | PRN

## 2022-08-13 MED ORDER — LIDOCAINE HCL (PF) 1 % IJ SOLN
INTRAMUSCULAR | Status: DC | PRN
  Administered 2022-08-13: 2 mL

## 2022-08-13 MED ORDER — FAMOTIDINE IN NACL 20-0.9 MG/50ML-% IV SOLN
INTRAVENOUS | Status: AC | PRN
  Administered 2022-08-13: 20 mg via INTRAVENOUS

## 2022-08-13 MED ORDER — HEPARIN (PORCINE) IN NACL 1000-0.9 UT/500ML-% IV SOLN
INTRAVENOUS | Status: DC | PRN
  Administered 2022-08-13 (×2): 500 mL

## 2022-08-13 MED ORDER — HYDRALAZINE HCL 20 MG/ML IJ SOLN: 10.0000 mg | INTRAMUSCULAR | Status: AC | PRN

## 2022-08-13 MED ORDER — HEPARIN SODIUM (PORCINE) 1000 UNIT/ML IJ SOLN
INTRAMUSCULAR | Status: DC | PRN
  Administered 2022-08-13: 4000 [IU] via INTRAVENOUS
  Administered 2022-08-13: 6000 [IU] via INTRAVENOUS
  Administered 2022-08-13: 2000 [IU] via INTRAVENOUS
  Administered 2022-08-13: 4000 [IU] via INTRAVENOUS

## 2022-08-13 MED ORDER — LIDOCAINE HCL (PF) 1 % IJ SOLN
INTRAMUSCULAR | Status: AC
  Filled 2022-08-13: qty 30

## 2022-08-13 MED ORDER — CLOPIDOGREL BISULFATE 300 MG PO TABS
ORAL_TABLET | ORAL | Status: AC
  Filled 2022-08-13: qty 2

## 2022-08-13 MED ORDER — FENTANYL CITRATE (PF) 100 MCG/2ML IJ SOLN
INTRAMUSCULAR | Status: AC
  Filled 2022-08-13: qty 2

## 2022-08-13 MED ORDER — OXYCODONE HCL 5 MG PO TABS: 5.0000 mg | ORAL_TABLET | ORAL | Status: DC | PRN

## 2022-08-13 SURGICAL SUPPLY — 23 items
BALL SAPPHIRE NC24 3.5X15 (BALLOONS) ×1
BALL SAPPHIRE NC24 3.75X12 (BALLOONS) ×1
BALLN SAPPHIRE 2.5X15 (BALLOONS) ×1
BALLN SCOREFLEX 3.0X10 (BALLOONS) ×1
BALLOON SAPPHIRE 2.5X15 (BALLOONS) IMPLANT
BALLOON SAPPHIRE NC24 3.5X15 (BALLOONS) IMPLANT
BALLOON SAPPHIRE NC24 3.75X12 (BALLOONS) IMPLANT
BALLOON SCOREFLEX 3.0X10 (BALLOONS) IMPLANT
BAND ZEPHYR COMPRESS 30 LONG (HEMOSTASIS) IMPLANT
CATH 5FR JL3.5 JR4 ANG PIG MP (CATHETERS) IMPLANT
CATH VISTA GUIDE 6FR XBLAD3.5 (CATHETERS) IMPLANT
ELECT DEFIB PAD ADLT CADENCE (PAD) IMPLANT
GLIDESHEATH SLEND SS 6F .021 (SHEATH) IMPLANT
GUIDEWIRE INQWIRE 1.5J.035X260 (WIRE) IMPLANT
INQWIRE 1.5J .035X260CM (WIRE) ×1
KIT ENCORE 26 ADVANTAGE (KITS) IMPLANT
KIT HEART LEFT (KITS) ×1 IMPLANT
PACK CARDIAC CATHETERIZATION (CUSTOM PROCEDURE TRAY) ×1 IMPLANT
STENT SYNERGY XD 3.50X20 (Permanent Stent) IMPLANT
SYNERGY XD 3.50X20 (Permanent Stent) ×1 IMPLANT
TRANSDUCER W/STOPCOCK (MISCELLANEOUS) ×1 IMPLANT
TUBING CIL FLEX 10 FLL-RA (TUBING) ×1 IMPLANT
WIRE COUGAR XT STRL 190CM (WIRE) IMPLANT

## 2022-08-13 NOTE — Plan of Care (Signed)

## 2022-08-13 NOTE — Interval H&P Note (Signed)
History and Physical Interval Note:  08/13/2022 1:32 PM  Leon Allen  has presented today for surgery, with the diagnosis of cad.  The various methods of treatment have been discussed with the patient and family. After consideration of risks, benefits and other options for treatment, the patient has consented to  Procedure(s): LEFT HEART CATH AND CORONARY ANGIOGRAPHY (N/A) as a surgical intervention.  The patient's history has been reviewed, patient examined, no change in status, stable for surgery.  I have reviewed the patient's chart and labs.  Questions were answered to the patient's satisfaction.    Cath Lab Visit (complete for each Cath Lab visit)  Clinical Evaluation Leading to the Procedure:   ACS: No.  Non-ACS:    Anginal Classification: CCS III  Anti-ischemic medical therapy: Maximal Therapy (2 or more classes of medications)  Non-Invasive Test Results: No non-invasive testing performed  Prior CABG: No previous CABG        Leon Allen

## 2022-08-14 ENCOUNTER — Other Ambulatory Visit (HOSPITAL_COMMUNITY): Payer: Self-pay

## 2022-08-14 ENCOUNTER — Encounter (HOSPITAL_COMMUNITY): Payer: Self-pay | Admitting: Cardiovascular Disease

## 2022-08-14 ENCOUNTER — Telehealth: Payer: Self-pay

## 2022-08-14 DIAGNOSIS — Z955 Presence of coronary angioplasty implant and graft: Secondary | ICD-10-CM

## 2022-08-14 DIAGNOSIS — I200 Unstable angina: Secondary | ICD-10-CM

## 2022-08-14 MED ORDER — ASPIRIN 81 MG PO CHEW
81.0000 mg | CHEWABLE_TABLET | Freq: Every day | ORAL | 1 refills | Status: AC
  Filled 2022-08-14: qty 90, 90d supply, fill #0

## 2022-08-14 MED ORDER — ISOSORBIDE MONONITRATE ER 30 MG PO TB24
90.0000 mg | ORAL_TABLET | Freq: Every day | ORAL | 5 refills | Status: DC
  Filled 2022-08-14: qty 90, 30d supply, fill #0

## 2022-08-14 MED ORDER — ATORVASTATIN CALCIUM 80 MG PO TABS
80.0000 mg | ORAL_TABLET | Freq: Every day | ORAL | 1 refills | Status: DC
  Filled 2022-08-14: qty 30, 30d supply, fill #0

## 2022-08-14 MED ORDER — CLOPIDOGREL BISULFATE 75 MG PO TABS
75.0000 mg | ORAL_TABLET | Freq: Every day | ORAL | 1 refills | Status: DC
  Filled 2022-08-14: qty 90, 90d supply, fill #0

## 2022-08-14 MED ORDER — PANTOPRAZOLE SODIUM 20 MG PO TBEC
20.0000 mg | DELAYED_RELEASE_TABLET | Freq: Every day | ORAL | 1 refills | Status: DC
  Filled 2022-08-14: qty 90, 90d supply, fill #0

## 2022-08-14 NOTE — Progress Notes (Signed)
CARDIAC REHAB PHASE I   PRE:  Rate/Rhythm: 72   BP:  Supine: 113/82     SaO2: 98 RA  MODE:  Ambulation: 680 ft   POST:  Rate/Rhythm: 71  BP:  Supine: 109/86      SaO2: 98 RA  Pt tolerated exercise well and AMB 680 ft with no assistive device, and standby assist. Pt had no rest break, chest pain, SOB or pain. Education given to pt on heart healthy diet, radial/femoral weight restrictions, and adherence to NTG, Brilinta and ASA. Home exercise guidelines given and will refer to cardiac rehab phase 2 [place]. Also advised pt that he should not go deer hunting this week and enforced strongly his radial precautions and what to do in case of re-bleed. Pt left in the bed with call bell in reach and eager to return home. All questions were answered and pt verbalized understanding.  [phone] Leon Allen ACSM-CEP 08/14/2022 8:53 AM

## 2022-08-14 NOTE — Progress Notes (Signed)
Mobility Specialist Progress Note    08/14/22 1021  Mobility  Activity Ambulated independently in hallway  Level of Assistance Independent  Assistive Device None  Distance Ambulated (ft) 420 ft  Activity Response Tolerated well  $Mobility charge 1 Mobility   Pre-Mobility: 75 HR Post-Mobility: 65 HR  Pt received standing EOB and agreeable. No complaints on walk. Returned to sitting EOB with call bell in reach.    Hildred Alamin Mobility Specialist  Secure Chat Only

## 2022-08-14 NOTE — Discharge Summary (Signed)
Discharge Summary    Patient ID: Leon Allen MRN: [id number]; DOB: 09-01-1966  Admit date: 08/13/2022 Discharge date: 08/14/2022  PCP:  Pcp, No   [facility] Providers Cardiologist:  None        Discharge Diagnoses    Principal Problem:   Unstable angina [facility])    Diagnostic Studies/Procedures    08/14/2022 LHC   Mid LAD lesion is 90% stenosed.   Prox RCA lesion is 30% stenosed.   Prox Cx to Mid Cx lesion is 30% stenosed.   A drug-eluting stent was successfully placed using a SYNERGY XD 3.50X20.   Post intervention, there is a 0% residual stenosis.   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  Successful PTCA/DES x 1 mid LAD covering the entire old stented segment Mild non-obstructive disease in the proximal RCA and mid Circumflex Mild hypokinesis of the anterior wall and apex with overall preserved LV systolic function   Recommendations: DAPT with ASA and Plavix for at least six months but longer if tolerated. Will continue statin and beta blocker.   Diagnostic Dominance: Right  Intervention   _____________   History of Present Illness     Leon Allen is a 56 y.o. male with hx CAD (prior mLAC PCI), current tobacco user, former marijuana user (stopped smoking marijuana last month). Patient reported recurrence of chest pain similar to that preceding LAD stent placement starting September of this year. Pain began while he was feeding his dog, was sharp in nature and radiated to neck. He was seen by Dr. Gasper Sells in clinic and nitroglycerin/Imdur added for anti-anginal therapy. Despite medication titration, patient continued to experience chest pain and at clinic follow up on 10/20, decision made to pursue [place].   Of note, medication/healthcare costs have been a challenge for patient.  Hospital Course    CAD Hypertension  Patient underwent staged LHC/PCI on 10/25 with Dr. Angelena Form, found with severe restenosis mid LAD stent. Successful  PTCA/DES x 1 mid LAD covering the entire old stented segment. Mild non-obstructive disease in the proximal RCA and mid Circumflex. Mild hypokinesis of the anterior wall and apex with overall preserved LV systolic function. Plan for DAPT with ASA/Plavix for at least 6 months. Continue Imdur 81m, Lisinopril 512m Metoprolol Succinate 2563mD. BP well controlled while admitted with this regimen. Continue high intensity statin.   On day of discharge, patient is stable, reports feeling great. [place] replaced with Protonix given Plavix Rx. Follow-up appointment arranged as below.       Did the patient have an acute coronary syndrome (MI, NSTEMI, STEMI, etc) this admission?:  No                               Did the patient have a percutaneous coronary intervention (stent / angioplasty)?:  Yes.     Cath/PCI Registry Performance & Quality Measures: Aspirin prescribed? - Yes ADP Receptor Inhibitor (Plavix/Clopidogrel, Brilinta/Ticagrelor or Effient/Prasugrel) prescribed (includes medically managed patients)? - Yes High Intensity Statin (Lipitor 40-54m84m Crestor 20-40mg63mescribed? - Yes For EF <40%, was ACEI/ARB prescribed? - Patient already taking ACEi For EF <40%, Aldosterone Antagonist (Spironolactone or Eplerenone) prescribed? - Not Applicable (EF >/= 40%) 40%diac Rehab Phase II ordered? - Yes       The patient will be scheduled for a TOC follow up appointment on 11/2 @10 :05 with Dayna.  A message has been sent to  the TOC Pool and Scheduling Pool at the office where the patient should be seen for follow up.  _____________  Discharge Vitals Blood pressure 109/86, pulse 67, temperature 98.3 F (36.8 C), temperature source Oral, resp. rate 15, height 5' 10"  (1.778 m), weight 79.4 kg, SpO2 97 %.  Filed Weights   08/13/22 1136  Weight: 79.4 kg   See same day progress note from attending physician for physical exam. Labs & Radiologic Studies     CBC Recent Labs    08/13/22 2313  WBC 7.7  NEUTROABS 4.2  HGB 13.2  HCT 38.5*  MCV 92.3  PLT 308   Basic Metabolic Panel Recent Labs    08/13/22 2313  NA 139  K 3.7  CL 105  CO2 23  GLUCOSE 111*  BUN 20  CREATININE 1.20  CALCIUM 8.9   Liver Function Tests No results for input(s): "AST", "ALT", "ALKPHOS", "BILITOT", "PROT", "ALBUMIN" in the last 72 hours. No results for input(s): "LIPASE", "AMYLASE" in the last 72 hours. High Sensitivity Troponin:   No results for input(s): "TROPONINIHS" in the last 720 hours.  BNP Invalid input(s): "POCBNP" D-Dimer No results for input(s): "DDIMER" in the last 72 hours. Hemoglobin A1C No results for input(s): "HGBA1C" in the last 72 hours. Fasting Lipid Panel No results for input(s): "CHOL", "HDL", "LDLCALC", "TRIG", "CHOLHDL", "LDLDIRECT" in the last 72 hours. Thyroid Function Tests No results for input(s): "TSH", "T4TOTAL", "T3FREE", "THYROIDAB" in the last 72 hours.  Invalid input(s): "FREET3" _____________  CARDIAC CATHETERIZATION  Result Date: 08/13/2022   Mid LAD lesion is 90% stenosed.   Prox RCA lesion is 30% stenosed.   Prox Cx to Mid Cx lesion is 30% stenosed.   A drug-eluting stent was successfully placed using a SYNERGY XD 3.50X20.   Post intervention, there is a 0% residual stenosis. 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 Successful PTCA/DES x 1 mid LAD covering the entire old stented segment Mild non-obstructive disease in the proximal RCA and mid Circumflex Mild hypokinesis of the anterior wall and apex with overall preserved LV systolic function Recommendations: DAPT with ASA and Plavix for at least six months but longer if tolerated. Will continue statin and beta blocker.   Disposition   Pt is being discharged home today in good condition.  Follow-up Plans & Appointments     Follow-up Information     [name], PA-C Follow up on 08/21/2022.   Specialties: Cardiology, Radiology Why: Cardiology  follow-up on 11/02 at 10:05am. Contact information: [address] [place] [address] [phone]                Discharge Instructions     Amb Referral to Cardiac Rehabilitation   Complete by: As directed    Diagnosis: Coronary Stents   After initial evaluation and assessments completed: Virtual Based Care may be provided alone or in conjunction with Phase 2 Cardiac Rehab based on patient barriers.: Yes   Intensive Cardiac Rehabilitation (ICR) [place] location only OR Traditional Cardiac Rehabilitation (TCR) *If criteria for ICR are not met will enroll in TCR [facility] only): Yes   Call MD for:  redness, tenderness, or signs of infection (pain, swelling, redness, odor or green/yellow discharge around incision site)   Complete by: As directed    Diet - low sodium heart healthy   Complete by: As directed    Discharge instructions   Complete by: As directed    NO HEAVY LIFTING OR SEXUAL ACTIVITY X 7 DAYS.  NO DRIVING X 2-3 DAYS. NO SOAKING BATHS, HOT TUBS, POOLS, ETC., X 7 DAYS.  Please take all medications as prescribed. If you have any issues access your prescriptions due to cost or availability, please call the cardiology office immediately.   Increase activity slowly   Complete by: As directed         Discharge Medications   Allergies as of 08/14/2022       Reactions   Bee Venom Swelling   Penicillins Other (See Comments)   Has patient had a PCN reaction causing immediate rash, facial/tongue/throat swelling, SOB or lightheadedness with hypotension: No Has patient had a PCN reaction causing severe rash involving mucus membranes or skin necrosis: No Has patient had a PCN reaction that required hospitalization No Has patient had a PCN reaction occurring within the last 10 years: No If all of the above answers are "NO", then may proceed with Cephalosporin use.        Medication List     STOP taking these medications    esomeprazole 20 MG  capsule Commonly known as: [place] these medications    aspirin 81 MG chewable tablet Chew 1 tablet (81 mg total) by mouth daily. What changed:  how much to take when to take this   atorvastatin 80 MG tablet Commonly known as: LIPITOR Take 1 tablet (80 mg total) by mouth daily. Start taking on: August 15, 2022   clopidogrel 75 MG tablet Commonly known as: PLAVIX Take 1 tablet (75 mg total) by mouth daily with breakfast. Start taking on: August 15, 2022   Fish Oil 1000 MG Caps Take 2,000 mg by mouth daily.   isosorbide mononitrate 30 MG 24 hr tablet Commonly known as: IMDUR Take 3 tablets (90 mg total) by mouth daily. Start taking on: August 15, 2022 What changed: medication strength   lisinopril 5 MG tablet Commonly known as: ZESTRIL Take 1 tablet (5 mg total) by mouth daily.   metoprolol succinate 25 MG 24 hr tablet Commonly known as: TOPROL-XL Take 1 tablet (25 mg total) by mouth daily.   nitroGLYCERIN 0.4 MG SL tablet Commonly known as: NITROSTAT Place 1 tablet (0.4 mg total) under the tongue every 5 (five) minutes as needed for chest pain.   pantoprazole 20 MG tablet Commonly known as: Protonix Take 1 tablet (20 mg total) by mouth daily.   VITAMIN B-12 PO Take 2,500 mcg by mouth daily.   VITAMIN C PO Take 2,000 mg by mouth daily. 1000 mg each           Outstanding Labs/Studies     Duration of Discharge Encounter   Greater than 30 minutes including physician time.  Delton Coombes, PA-C 08/14/2022, 10:15 AM

## 2022-08-14 NOTE — Telephone Encounter (Signed)
**Note De-identified Tye Vigo Obfuscation** -----  **Note De-Identified Laurene Melendrez Obfuscation** Message from Lily Kocher, PA-C sent at 08/14/2022 10:02 AM EDT ----- Regarding: TOC call needed This patient is discharging today s/p PCI. Scheduled for follow up with Dayna on 11/2 '@10'$ :05. He will need a TOC call, thanks!  Lily Kocher PA-C

## 2022-08-14 NOTE — Progress Notes (Signed)
Spoke with pharmacy and [place] bedside RN this morning to f/u with pt meds before dc home, pt stated to this RN yesterday he was unable to continue taking plavix because he had lost his insurance and was unable to afford the cost  This RN spoke with Dr. Angelena Form this AM and he stated the rounding team would follow-up/check on pt

## 2022-08-14 NOTE — Progress Notes (Signed)
DAILY PROGRESS NOTE   Patient Name: Leon Allen Date of Encounter: 08/14/2022 Cardiologist: None  Chief Complaint   No complaints  Patient Profile   56 yo male with history of CAD and prior mid-LAD PCI, presented with unstable angina  Subjective   Leon Allen feels great today - ready to go home. Was found to have significant ISR of the mid-LAD taxus stent - treated with DES covering the entire old stented segment.   Objective   Vitals:   08/13/22 2309 08/14/22 0448 08/14/22 0745 08/14/22 0907  BP:  110/78 115/76 109/86  Pulse:  (!) 55 60 67  Resp: 17 15    Temp: (!) 97.3 F (36.3 C) (!) 97.5 F (36.4 C) 98.3 F (36.8 C)   TempSrc: Oral Oral Oral   SpO2:  96% 97%   Weight:      Height:        Intake/Output Summary (Last 24 hours) at 08/14/2022 3500 Last data filed at 08/14/2022 0300 Gross per 24 hour  Intake 240 ml  Output --  Net 240 ml   Filed Weights   08/13/22 1136  Weight: 79.4 kg    Physical Exam   General appearance: alert and no distress Neck: no carotid bruit, no JVD, and thyroid not enlarged, symmetric, no tenderness/mass/nodules Lungs: clear to auscultation bilaterally Heart: regular rate and rhythm Abdomen: soft, non-tender; bowel sounds normal; no masses,  no organomegaly Extremities: extremities normal, atraumatic, no cyanosis or edema and right radial cath site without ecchymosis, bruit or hematoma Pulses: 2+ and symmetric Skin: Skin color, texture, turgor normal. No rashes or lesions Neurologic: Grossly normal Psych: Pleasant  Inpatient Medications    Scheduled Meds:  aspirin  81 mg Oral Daily   atorvastatin  80 mg Oral Daily   clopidogrel  75 mg Oral Q breakfast   isosorbide mononitrate  90 mg Oral Daily   lisinopril  5 mg Oral Daily   metoprolol succinate  25 mg Oral Daily   sodium chloride flush  3 mL Intravenous Q12H    Continuous Infusions:  sodium chloride      PRN Meds: sodium chloride, acetaminophen,  morphine injection, nitroGLYCERIN, ondansetron (ZOFRAN) IV, oxyCODONE, sodium chloride flush   Labs   Results for orders placed or performed during the hospital encounter of 08/13/22 (from the past 48 hour(s))  POCT Activated clotting time     Status: None   Collection Time: 08/13/22  2:01 PM  Result Value Ref Range   Activated Clotting Time 239 seconds    Comment: Reference range 74-137 seconds for patients not on anticoagulant therapy.  POCT Activated clotting time     Status: None   Collection Time: 08/13/22  2:15 PM  Result Value Ref Range   Activated Clotting Time 263 seconds    Comment: Reference range 74-137 seconds for patients not on anticoagulant therapy.  Basic metabolic panel     Status: Abnormal   Collection Time: 08/13/22 11:13 PM  Result Value Ref Range   Sodium 139 135 - 145 mmol/L   Potassium 3.7 3.5 - 5.1 mmol/L   Chloride 105 98 - 111 mmol/L   CO2 23 22 - 32 mmol/L   Glucose, Bld 111 (H) 70 - 99 mg/dL    Comment: Glucose reference range applies only to samples taken after fasting for at least 8 hours.   BUN 20 6 - 20 mg/dL   Creatinine, Ser 1.20 0.61 - 1.24 mg/dL   Calcium 8.9 8.9 - 10.3  mg/dL   GFR, Estimated >60 >60 mL/min    Comment: (NOTE) Calculated using the CKD-EPI Creatinine Equation (2021)    Anion gap 11 5 - 15    Comment: Performed at [facility], [place] [address] [place], [place] [address]  CBC with Differential/Platelet     Status: Abnormal   Collection Time: 08/13/22 11:13 PM  Result Value Ref Range   WBC 7.7 4.0 - 10.5 K/uL   RBC 4.17 (L) 4.22 - 5.81 MIL/uL   Hemoglobin 13.2 13.0 - 17.0 g/dL   HCT 38.5 (L) 39.0 - 52.0 %   MCV 92.3 80.0 - 100.0 fL   MCH 31.7 26.0 - 34.0 pg   MCHC 34.3 30.0 - 36.0 g/dL   RDW 12.1 11.5 - 15.5 %   Platelets 205 150 - 400 K/uL   nRBC 0.0 0.0 - 0.2 %   Neutrophils Relative % 56 %   Neutro Abs 4.2 1.7 - 7.7 K/uL   Lymphocytes Relative 30 %   Lymphs Abs 2.3 0.7 - 4.0 K/uL   Monocytes Relative 8 %    Monocytes Absolute 0.6 0.1 - 1.0 K/uL   Eosinophils Relative 5 %   Eosinophils Absolute 0.4 0.0 - 0.5 K/uL   Basophils Relative 1 %   Basophils Absolute 0.1 0.0 - 0.1 K/uL   Immature Granulocytes 0 %   Abs Immature Granulocytes 0.01 0.00 - 0.07 K/uL    Comment: Performed at [facility] [address] [place], [place] [address]    ECG   Sinus bradycardia at 51 - Personally Reviewed  Telemetry   Sinus rhythm - Personally Reviewed  Radiology    CARDIAC CATHETERIZATION  Result Date: 08/13/2022   Mid LAD lesion is 90% stenosed.   Prox RCA lesion is 30% stenosed.   Prox Cx to Mid Cx lesion is 30% stenosed.   A drug-eluting stent was successfully placed using a SYNERGY XD 3.50X20.   Post intervention, there is a 0% residual stenosis. 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 Successful PTCA/DES x 1 mid LAD covering the entire old stented segment Mild non-obstructive disease in the proximal RCA and mid Circumflex Mild hypokinesis of the anterior wall and apex with overall preserved LV systolic function Recommendations: DAPT with ASA and Plavix for at least six months but longer if tolerated. Will continue statin and beta blocker.    Cardiac Studies   N/A  Assessment   Principal Problem:   Unstable angina (HCC)   Plan   Successful PCI to the mid-LAD for in-stent restenosis. He feels much better today. Cath site without issues. Labs stable. [place] for d/c home today. Follow-up with Dr. Dennison Bulla.  Time Spent Directly with Patient:  I have spent a total of 25 minutes with the patient reviewing hospital notes, telemetry, EKGs, labs and examining the patient as well as establishing an assessment and plan that was discussed personally with the patient.  > 50% of time was spent in direct patient care.  Length of Stay:  LOS: 1 day   Pixie Casino, MD, [facility], [place] Director of the Advanced Lipid Disorders &  Cardiovascular Risk  Reduction Clinic Diplomate of the American Board of Clinical Lipidology Attending Cardiologist  Direct Dial: [phone]  Fax: [phone]  Website:  [email or web address]  Nadean Corwin Amonda Brillhart 08/14/2022, 9:22 AM

## 2022-08-14 NOTE — Plan of Care (Signed)

## 2022-08-15 LAB — LIPOPROTEIN A (LPA): Lipoprotein (a): 25.1 nmol/L (ref <75.0)

## 2022-08-15 NOTE — Telephone Encounter (Addendum)
**Note De-Identified Nimrit Kehres Obfuscation** Patient contacted regarding discharge from [facility] on 08/14/2022.  Patient understands to follow up with provider Melina Copa, PA-c on 08/21/2022 at 10:05 at [address] [place] in [place], [place] [address]. Patient understands discharge instructions? Yes Patient understands medications and regiment? Yes Patient understands to bring all medications to this visit? Yes  Ask patient:  Are you enrolled in My Chart: Yes    The pt reports that he was taking a "cholesterol pill" about 20 years ago and that his MD at the time told him to stop taking it due to CP. He states that since he started taking Atorvastatin yesterday, he has been having chest pressure but denies radiation of pain/pressure, sob, nausea, vomiting, diaphoresis, dizziness, body aches or pains.  He is aware that I am forwarding this note to  Melina Copa, PA-c  to make her aware of this and that if she wants to make any changes we will contact him. He verbalized understanding and thanked me for my call.

## 2022-08-15 NOTE — Telephone Encounter (Signed)
Hi Lynn, I have otherwise not met this patient before so would recommend this go to his primary cardiologist for review and advisement. Will cc to Dr. Gasper Sells for input. See Lynn's message below.  Will then plan to follow up response to those recommendations when I meet him next week.

## 2022-08-15 NOTE — Telephone Encounter (Signed)
**Note De-Identified Leylah Tarnow Obfuscation** Terisa Starr, MD  Dunn, Nedra Hai, PA-C; Zeda Gangwer, Deliah Boston, LPN Cc: Desmond Dike Div [facility] Triage; Precious Gilding, RN Caller: Unspecified (Yesterday, 10:25 AM) He is a gentleman lost to follow up found to have CAD and s/p recent PCI after significant ISR.  If he is having chest pain; the likely related to his CAD, for which is epicardial disease has been treated.  - he can do a trial of stopping the statin over the weekend to see if it goes away.  - will likely increase to IMDUR 120 mg PO XL.   The pt is advised to NOT take his Atorvastatin over this weekend and to call us back on Monday 10/30 if his chest pressure, discomfort, or pain does not improve and that if his s/s do go away he can resume Atorvastatin.  He states that is does not want to resume Atorvastatin and will discuss other options at his post hosp f/u on 11/2.  He thanked me for our assistance.

## 2022-08-20 ENCOUNTER — Ambulatory Visit (INDEPENDENT_AMBULATORY_CARE_PROVIDER_SITE_OTHER): Payer: Self-pay | Admitting: Nurse Practitioner

## 2022-08-20 ENCOUNTER — Encounter: Payer: Self-pay | Admitting: Nurse Practitioner

## 2022-08-20 VITALS — BP 104/70 | HR 73 | Temp 98.5°F | Ht 70.0 in | Wt 175.0 lb

## 2022-08-20 DIAGNOSIS — Z7253 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: Secondary | ICD-10-CM

## 2022-08-20 DIAGNOSIS — Z8673 Personal history of transient ischemic attack (TIA), and cerebral infarction without residual deficits: Secondary | ICD-10-CM

## 2022-08-20 DIAGNOSIS — I10 Essential (primary) hypertension: Secondary | ICD-10-CM

## 2022-08-20 DIAGNOSIS — I200 Unstable angina: Secondary | ICD-10-CM

## 2022-08-20 DIAGNOSIS — F1290 Cannabis use, unspecified, uncomplicated: Secondary | ICD-10-CM

## 2022-08-20 DIAGNOSIS — Z131 Encounter for screening for diabetes mellitus: Secondary | ICD-10-CM

## 2022-08-20 DIAGNOSIS — Z09 Encounter for follow-up examination after completed treatment for conditions other than malignant neoplasm: Secondary | ICD-10-CM

## 2022-08-20 DIAGNOSIS — I25110 Atherosclerotic heart disease of native coronary artery with unstable angina pectoris: Secondary | ICD-10-CM

## 2022-08-20 LAB — POCT GLYCOSYLATED HEMOGLOBIN (HGB A1C): Hemoglobin A1C: 5.6 % (ref 4.0–5.6)

## 2022-08-20 NOTE — Assessment & Plan Note (Signed)
in the hospital from October 25 to August 14, 2022 for unstable angina.  In the hospital patient underwent PCI on August 13, 2022.  Patient currently denies chest pain, fever, shortness of breath, edema.  Hospital discharge summary, labs reviewed by me today Patient encouraged to maintain close follow-up with cardiology verbalized understanding

## 2022-08-20 NOTE — Assessment & Plan Note (Addendum)
Currently taking  aspirin 81 mg daily, Imdur 90 mg daily, metoprolol 25 mg daily.  Lisinopril 5 mg daily.  Has Plavix 75 mg daily, atorvastatin 80 mg daily ordered but he has not been taking Plavix and  atorvastatin.   Need to start taking Plavix and atorvastatin discussed with the patient, risk of having another stroke or heart attack discussed with the patient.  Patient was encouraged to maintain close follow-up with cardiology.

## 2022-08-20 NOTE — Addendum Note (Signed)
Addended by: Elyse Jarvis on: 08/20/2022 02:40 PM   Modules accepted: Orders

## 2022-08-20 NOTE — Assessment & Plan Note (Addendum)
States that he is intolerant to atorvastatin, has not been taking plavix as well, states that cardiology is already aware that he is not taking atorvastatin.  Need to be on a statin discussed with patient , he will benefit  from Repatha injection if he continues to decline atorvastatin.  He has upcoming appointment with cardiology Patient encouraged to maintain close follow-up with cardiology he verbalized understanding. Continue to abstain from smoking cigarettes and drugs  Lab Results  Component Value Date   LDLCALC 86 01/17/2016

## 2022-08-20 NOTE — Assessment & Plan Note (Signed)
Need to avoid smoking marijuana discussed with patient he verbalized understanding.

## 2022-08-20 NOTE — Assessment & Plan Note (Signed)
[place] NAA, Confirmation ordered.  He denies any symptoms today

## 2022-08-20 NOTE — Assessment & Plan Note (Signed)
BP Readings from Last 3 Encounters:  08/20/22 104/70  08/14/22 109/86  08/08/22 122/74  Blood pressure well controlled on metoprolol 25 mg daily, lisinopril 5 mg daily, Imdur 90 mg daily Continue current medications DASH diet advised engage in regular daily exercises at least 150 minutes weekly. Follow-up in 4 months

## 2022-08-20 NOTE — Assessment & Plan Note (Signed)
Lab Results  Component Value Date   HGBA1C 5.6 08/20/2022

## 2022-08-20 NOTE — Patient Instructions (Addendum)
Please take all your medications as ordered  and keep your appointment with cardiology.    Please consider getting your TDAP vaccine, shingles vaccine and Influenza vaccines at the pharmacy    It is important that you exercise regularly at least 30 minutes 5 times a week as tolerated  Think about what you will eat, plan ahead. Choose " clean, green, fresh or frozen" over canned, processed or packaged foods which are more sugary, salty and fatty. 70 to 75% of food eaten should be vegetables and fruit. Three meals at set times with snacks allowed between meals, but they must be fruit or vegetables. Aim to eat over a 12 hour period , example 7 am to 7 pm, and STOP after  your last meal of the day. Drink water,generally about 64 ounces per day, no other drink is as healthy. Fruit juice is best enjoyed in a healthy way, by EATING the fruit.  Thanks for choosing [facility] we consider it a privelige to serve you.

## 2022-08-20 NOTE — Progress Notes (Addendum)
New Patient Office Visit  Subjective:  Patient ID: Leon Allen, male    DOB: [birth year]  Age: 56 y.o. MRN: [id number]  CC:  Chief Complaint  Patient presents with   Establish Care    Follow up on teeth , heart concerns, STD screening, and kidney stone     HPI Leon Allen is a 56 y.o. male with past medical history of CAD, CVA, hypertension, tobacco user presents for follow-up for hospital admission for unstable angina and to establish care for his chronic medical conditions.  patient was in the hospital from October 25 to August 14, 2022 for unstable angina.  In the hospital patient underwent PCI to the mid LAD on August 13, 2022.  Patient currently denies chest pain, fever, shortness of breath, edema.  Has follow-up appointment with cardiology on November 22.  Patient has not been taking atorvastatin and Plavix, states that atorvastatin makes him feels hot and sweaty, gives him chest pressure.  He said he has discussed this with cardiology.  Currently taking Imdur 90 mg daily, lisinopril 5 mg daily, metoprolol 25 mg daily, aspirin 81 mg daily.  Plan was for the patient to take aspirin and Plavix for at least 6 months and longer if well-tolerated.  This plan was discussed with the patient.   Patient would like STI testing done today stated that his last sexual encounter was about 6 months ago, states that  he has had different women in his life.  Patient denies fever, chills, dysuria, penile discharge, abdominal pain., pelvic pain, retention.    Due for Tdap vaccine, shingles vaccine, influenza vaccine.  flu vaccine declined in the office today.  Need for all vaccines discussed with patient patient encouraged to consider getting the vaccines.      Past Medical History:  Diagnosis Date   Adrenal adenoma    bilateral   Complication of anesthesia    "@ dentist office; was overdosed"   Coronary artery disease    CVA (cerebral vascular accident) ([place]) 2015   denies residual  on 01/17/2016   Daily headache    "lately" (01/17/2016)   Gonorrhea 2015   Heart attack ([place]) 2005   Hematuria 12/2015   Kidney stone    Situational depression    "old girlfriend missing"    Past Surgical History:  Procedure Laterality Date   APPENDECTOMY     CORONARY ANGIOPLASTY WITH STENT PLACEMENT  2005   CORONARY STENT INTERVENTION N/A 08/13/2022   Procedure: CORONARY STENT INTERVENTION;  Surgeon: Burnell Blanks, MD;  Location: [facility];  Service: Cardiovascular;  Laterality: N/A;   INGUINAL HERNIA REPAIR Right    LEFT HEART CATH AND CORONARY ANGIOGRAPHY N/A 08/13/2022   Procedure: LEFT HEART CATH AND CORONARY ANGIOGRAPHY;  Surgeon: Burnell Blanks, MD;  Location: [facility];  Service: Cardiovascular;  Laterality: N/A;   LITHOTRIPSY  X 2    Family History  Problem Relation Age of Onset   Cancer Mother        Hodgkins lymphoma   Breast cancer Sister    Heart attack Other     Social History   Socioeconomic History   Marital status: Divorced    Spouse name: Not on file   Number of children: 2   Years of education: Not on file   Highest education level: Not on file  Occupational History   Not on file  Tobacco Use   Smoking status: Former    Packs/day: 3.00  Years: 40.00    Total pack years: 120.00    Types: Cigarettes    Quit date: 10/21/2015    Years since quitting: 6.8   Smokeless tobacco: Former    Types: Chew   Tobacco comments:    "chewed some when I was young; not for long"  Substance and Sexual Activity   Alcohol use: Yes    Alcohol/week: 0.0 standard drinks of alcohol    Comment: 01/17/2016 "I'll drink beer or a mixed drink a few times/year"   Drug use: Yes    Types: Marijuana    Comment: 01/17/2016 "stopped marijuana 10/21/2015"   Sexual activity: Yes  Other Topics Concern   Not on file  Social History Narrative   Lives home alone   Social Determinants of Health   Financial Resource Strain: High Risk (07/07/2022)    Overall Financial Resource Strain (CARDIA)    Difficulty of Paying Living Expenses: Hard  Food Insecurity: No Food Insecurity (07/07/2022)   Hunger Vital Sign    Worried About Running Out of Food in the Last Year: Never true    Ran Out of Food in the Last Year: Never true  Transportation Needs: No Transportation Needs (07/07/2022)   PRAPARE - Hydrologist (Medical): No    Lack of Transportation (Non-Medical): No  Physical Activity: Sufficiently Active (08/20/2022)   Exercise Vital Sign    Days of Exercise per Week: 7 days    Minutes of Exercise per Session: 60 min  Stress: No Stress Concern Present (08/20/2022)   [place]    Feeling of Stress : Only a little  Social Connections: Socially Isolated (08/20/2022)   Social Connection and Isolation Panel [NHANES]    Frequency of Communication with Friends and Family: More than three times a week    Frequency of Social Gatherings with Friends and Family: Twice a week    Attends Religious Services: Never    Marine scientist or Organizations: No    Attends Archivist Meetings: Never    Marital Status: Divorced  Human resources officer Violence: Not At Risk (08/20/2022)   Humiliation, Afraid, Rape, and Kick questionnaire    Fear of Current or Ex-Partner: No    Emotionally Abused: No    Physically Abused: No    Sexually Abused: No    ROS Review of Systems  Objective:   Today's Vitals: BP 104/70   Pulse 73   Temp 98.5 F (36.9 C)   Ht '5\' 10"'$  (1.778 m)   Wt 175 lb (79.4 kg)   SpO2 98%   BMI 25.11 kg/m   Physical Exam Constitutional:      General: He is not in acute distress.    Appearance: Normal appearance. He is not ill-appearing, toxic-appearing or diaphoretic.  Eyes:     General: No scleral icterus.       Right eye: No discharge.        Left eye: No discharge.     Extraocular Movements: Extraocular movements intact.      Pupils: Pupils are equal, round, and reactive to light.  Cardiovascular:     Rate and Rhythm: Normal rate and regular rhythm.     Pulses: Normal pulses.     Heart sounds: Normal heart sounds. No murmur heard.    No friction rub. No gallop.  Pulmonary:     Effort: Pulmonary effort is normal. No respiratory distress.     Breath sounds: Normal  breath sounds. No stridor. No wheezing, rhonchi or rales.  Chest:     Chest wall: No tenderness.  Abdominal:     General: There is no distension.     Palpations: There is no mass.     Tenderness: There is no abdominal tenderness. There is no right CVA tenderness, left CVA tenderness or guarding.  Musculoskeletal:        General: No swelling, tenderness, deformity or signs of injury.     Right lower leg: No edema.     Left lower leg: No edema.  Skin:    General: Skin is warm and dry.     Capillary Refill: Capillary refill takes less than 2 seconds.     Comments: Right radial puncture site warm and dry, no swelling,redness, tenderness noted   Neurological:     Mental Status: He is alert and oriented to person, place, and time.     Cranial Nerves: No cranial nerve deficit.     Sensory: No sensory deficit.     Motor: No weakness.     Coordination: Coordination normal.     Gait: Gait normal.  Psychiatric:        Mood and Affect: Mood normal.        Behavior: Behavior normal.        Thought Content: Thought content normal.        Judgment: Judgment normal.     Assessment & Plan:   Problem List Items Addressed This Visit       Cardiovascular and Mediastinum   Coronary artery disease    Currently taking  aspirin 81 mg daily, Imdur 90 mg daily, metoprolol 25 mg daily.  Lisinopril 5 mg daily.  Has Plavix 75 mg daily, atorvastatin 80 mg daily ordered but he has not been taking Plavix and  atorvastatin.   Need to start taking Plavix and atorvastatin discussed with the patient, risk of having another stroke or heart attack discussed with the  patient.  Patient was encouraged to maintain close follow-up with cardiology.       Essential hypertension    BP Readings from Last 3 Encounters:  08/20/22 104/70  08/14/22 109/86  08/08/22 122/74  Blood pressure well controlled on metoprolol 25 mg daily, lisinopril 5 mg daily, Imdur 90 mg daily Continue current medications DASH diet advised engage in regular daily exercises at least 150 minutes weekly. Follow-up in 4 months      Unstable angina (HCC) - Primary    recently had PCI with stent placement  He currently denies chest pain, shortness of breath, edema Continue nitro 0.4 mg as needed. Need to take Plavix,Asprin, atorvastatin isosorbide mononitrate, lisinopril, metoprolol as ordered discussed with the patient. Patient was encouraged to maintain close follow-up with cardiology.         Other   History of CVA (cerebrovascular accident)    States that he is intolerant to atorvastatin, has not been taking plavix as well, states that cardiology is already aware that he is not taking atorvastatin.  Need to be on a statin discussed with patient , he will benefit  from Repatha injection if he continues to decline atorvastatin.  He has upcoming appointment with cardiology Patient encouraged to maintain close follow-up with cardiology he verbalized understanding. Continue to abstain from smoking cigarettes and drugs  Lab Results  Component Value Date   LDLCALC 86 01/17/2016        Marijuana use    Need to avoid smoking marijuana discussed with patient he  verbalized understanding.      Hospital discharge follow-up    in the hospital from October 25 to August 14, 2022 for unstable angina.  In the hospital patient underwent PCI on August 13, 2022.  Patient currently denies chest pain, fever, shortness of breath, edema.  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  He denies any symptoms today       Relevant Orders   Chlamydia/GC NAA, Confirmation   Screening for diabetes mellitus (DM)    Lab Results  Component Value Date   HGBA1C 5.6 08/20/2022       Relevant Orders   POCT glycosylated hemoglobin (Hb A1C) (Completed)    Outpatient Encounter Medications as of 08/20/2022  Medication Sig   Ascorbic Acid (VITAMIN C PO) Take 2,000 mg by mouth daily. 1000 mg each   aspirin 81 MG chewable tablet Chew 1 tablet (81 mg total) by mouth daily.   Cyanocobalamin (VITAMIN B-12 PO) Take 2,500 mcg by mouth daily.   isosorbide mononitrate (IMDUR) 30 MG 24 hr tablet Take 3 tablets (90 mg total) by mouth daily.   lisinopril (ZESTRIL) 5 MG tablet Take 1 tablet (5 mg total) by mouth daily.   metoprolol succinate (TOPROL-XL) 25 MG 24 hr tablet Take 1 tablet (25 mg total) by mouth daily.   Omega-3 Fatty Acids (FISH OIL) 1000 MG CAPS Take 2,000 mg by mouth daily.   atorvastatin (LIPITOR) 80 MG tablet Take 1 tablet (80 mg total) by mouth daily. (Patient not taking: Reported on 08/20/2022)   clopidogrel (PLAVIX) 75 MG tablet Take 1 tablet (75 mg total) by mouth daily with breakfast. (Patient not taking: Reported on 08/20/2022)   nitroGLYCERIN (NITROSTAT) 0.4 MG SL tablet Place 1 tablet (0.4 mg total) under the tongue every 5 (five) minutes as needed for chest pain. (Patient not taking: Reported on 08/20/2022)   pantoprazole (PROTONIX) 20 MG tablet Take 1 tablet (20 mg total) by mouth daily.   No facility-administered encounter medications on file as of 08/20/2022.    Follow-up: Return in about 4 months (around 12/19/2022) for HTN/CAD.   Renee Rival, FNP

## 2022-08-20 NOTE — Assessment & Plan Note (Addendum)
recently had PCI with stent placement  He currently denies chest pain, shortness of breath, edema Continue nitro 0.4 mg as needed. Need to take Plavix,Asprin, atorvastatin isosorbide mononitrate, lisinopril, metoprolol as ordered discussed with the patient. Patient was encouraged to maintain close follow-up with cardiology.

## 2022-08-20 NOTE — Progress Notes (Unsigned)
Cardiology Office Note    Date:  08/21/2022   ID:  Leon Allen, DOB 1966-07-31, MRN [id number]  PCP:  Renee Rival, FNP  Cardiologist:  Werner Lean, MD  Electrophysiologist:  None   Chief Complaint: post cath f/u  History of Present Illness:   Leon Allen is a 56 y.o. male with history of CAD, prior tobacco abuse/THC/cocaine use, HTN, CVA, depression, adrenal adenomas on CT 2017 who is seen for post-cath follow-up. He had remote MI in 2005 with PCI/stent to LAD at that time, lost to follow-up. He was seen in the office in 06/2022 by Dr. Gasper Sells with increasing chest pain, medical therapy initiated. He was having to eat peaches to deal with kidney pain that patient attributed to Imdur. Patient underwent cath 08/13/22 (initially delayed due to pt's financial concerns) which showed severe restenosis of mLAD treated with DES, otherwise nonobstructive disease, mild hypokinesis of the anterior wall and apex with overall preserved LV systolic function. DAPT recommended for at least 6 months, longer if tolerated.   Post cath, he spoke with our office a few days ago on the phone reporting continued chest pressure that he attributed to atorvastatin so Imdur was increased and atorvastatin was stopped. He wondered if he had an infection. He is seen back for follow-up today and has felt well the last 3-4 days. He's felt especially good today. He does report he'd also stopped the Plavix for about 4 days when he was trying to figure out what medicine it was that caused his symptoms, but he took his first dose back today.  Labwork independently reviewed: 07/2022 Hgb 13.2, plt ok, K 3.7, Cr 1.20 2017 albumin 3.1, AST/ALT OK, MG 2.0, TSH wnl  Cardiology Studies:   Studies reviewed are outlined and summarized above. Reports included below if pertinent.   Cath 08/13/22   Mid LAD lesion is 90% stenosed.   Prox RCA lesion is 30% stenosed.   Prox Cx to Mid Cx lesion is 30%  stenosed.   A drug-eluting stent was successfully placed using a SYNERGY XD 3.50X20.   Post intervention, there is a 0% residual stenosis.   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  Successful PTCA/DES x 1 mid LAD covering the entire old stented segment Mild non-obstructive disease in the proximal RCA and mid Circumflex Mild hypokinesis of the anterior wall and apex with overall preserved LV systolic function   Recommendations: DAPT with ASA and Plavix for at least six months but longer if tolerated. Will continue statin and beta blocker.     Past Medical History:  Diagnosis Date   Adrenal adenoma    bilateral   Complication of anesthesia    "@ dentist office; was overdosed"   Coronary artery disease    CVA (cerebral vascular accident) ([place]) 2015   denies residual on 01/17/2016   Daily headache    "lately" (01/17/2016)   Gonorrhea 2015   Heart attack ([place]) 2005   Hematuria 12/2015   Kidney stone    Situational depression    "old girlfriend missing"    Past Surgical History:  Procedure Laterality Date   APPENDECTOMY     CORONARY ANGIOPLASTY WITH STENT PLACEMENT  2005   CORONARY STENT INTERVENTION N/A 08/13/2022   Procedure: CORONARY STENT INTERVENTION;  Surgeon: Burnell Blanks, MD;  Location: [facility];  Service: Cardiovascular;  Laterality: N/A;   INGUINAL HERNIA REPAIR Right    LEFT HEART CATH AND CORONARY ANGIOGRAPHY N/A 08/13/2022  Procedure: LEFT HEART CATH AND CORONARY ANGIOGRAPHY;  Surgeon: Burnell Blanks, MD;  Location: [facility];  Service: Cardiovascular;  Laterality: N/A;   LITHOTRIPSY  X 2    Current Medications: Current Meds  Medication Sig   Ascorbic Acid (VITAMIN C PO) Take 2,000 mg by mouth daily. 1000 mg each   aspirin 81 MG chewable tablet Chew 1 tablet (81 mg total) by mouth daily.   clopidogrel (PLAVIX) 75 MG tablet Take 1 tablet (75 mg total) by mouth daily with breakfast.   Cyanocobalamin (VITAMIN B-12 PO)  Take 2,500 mcg by mouth daily.   isosorbide mononitrate (IMDUR) 30 MG 24 hr tablet Take 3 tablets (90 mg total) by mouth daily.   lisinopril (ZESTRIL) 5 MG tablet Take 1 tablet (5 mg total) by mouth daily.   metoprolol succinate (TOPROL-XL) 25 MG 24 hr tablet Take 1 tablet (25 mg total) by mouth daily.   nitroGLYCERIN (NITROSTAT) 0.4 MG SL tablet Place 1 tablet (0.4 mg total) under the tongue every 5 (five) minutes as needed for chest pain.   Omega-3 Fatty Acids (FISH OIL) 1000 MG CAPS Take 2,000 mg by mouth daily.   pantoprazole (PROTONIX) 20 MG tablet Take 1 tablet (20 mg total) by mouth daily.      Allergies:   Bee venom and Penicillins   Social History   Socioeconomic History   Marital status: Divorced    Spouse name: Not on file   Number of children: 2   Years of education: Not on file   Highest education level: Not on file  Occupational History   Not on file  Tobacco Use   Smoking status: Former    Packs/day: 3.00    Years: 40.00    Total pack years: 120.00    Types: Cigarettes    Quit date: 10/21/2015    Years since quitting: 6.8   Smokeless tobacco: Former    Types: Chew   Tobacco comments:    "chewed some when I was young; not for long"  Substance and Sexual Activity   Alcohol use: Yes    Alcohol/week: 0.0 standard drinks of alcohol    Comment: 01/17/2016 "I'll drink beer or a mixed drink a few times/year"   Drug use: Yes    Types: Marijuana    Comment: 01/17/2016 "stopped marijuana 10/21/2015"   Sexual activity: Yes  Other Topics Concern   Not on file  Social History Narrative   Lives home alone   Social Determinants of Health   Financial Resource Strain: High Risk (07/07/2022)   Overall Financial Resource Strain (CARDIA)    Difficulty of Paying Living Expenses: Hard  Food Insecurity: No Food Insecurity (07/07/2022)   Hunger Vital Sign    Worried About Running Out of Food in the Last Year: Never true    Ran Out of Food in the Last Year: Never true   Transportation Needs: No Transportation Needs (07/07/2022)   PRAPARE - Hydrologist (Medical): No    Lack of Transportation (Non-Medical): No  Physical Activity: Sufficiently Active (08/20/2022)   Exercise Vital Sign    Days of Exercise per Week: 7 days    Minutes of Exercise per Session: 60 min  Stress: No Stress Concern Present (08/20/2022)   [place]    Feeling of Stress : Only a little  Social Connections: Socially Isolated (08/20/2022)   Social Connection and Isolation Panel [NHANES]    Frequency of  Communication with Friends and Family: More than three times a week    Frequency of Social Gatherings with Friends and Family: Twice a week    Attends Religious Services: Never    Marine scientist or Organizations: No    Attends Music therapist: Never    Marital Status: Divorced     Family History:  The patient's family history includes Breast cancer in his sister; Cancer in his mother; Heart attack in an other family member.  ROS:   Please see the history of present illness.  All other systems are reviewed and otherwise negative.    EKG(s)/Additional Labs   EKG:  EKG is ordered today, personally reviewed, demonstrating SB 52bpm, nonspecific TW changes otherwise normal. No acute change from prior.  Recent Labs: 08/13/2022: BUN 20; Creatinine, Ser 1.20; Hemoglobin 13.2; Platelets 205; Potassium 3.7; Sodium 139  Recent Lipid Panel    Component Value Date/Time   CHOL 132 01/17/2016 1954   TRIG 60 01/17/2016 1954   HDL 34 (L) 01/17/2016 1954   CHOLHDL 3.9 01/17/2016 1954   VLDL 12 01/17/2016 1954   [place] 86 01/17/2016 1954    PHYSICAL EXAM:    VS:  BP 100/70 (BP Location: Left Arm, Patient Position: Sitting, Cuff Size: Normal)   Pulse (!) 52   Ht '5\' 10"'$  (1.778 m)   Wt 173 lb (78.5 kg)   BMI 24.82 kg/m   BMI: Body mass index is 24.82 kg/m.  GEN: Well  nourished, well developed male in no acute distress HEENT: normocephalic, atraumatic Neck: no JVD, carotid bruits, or masses Cardiac: RRR; no murmurs, rubs, or gallops, no edema  Respiratory:  clear to auscultation bilaterally, normal work of breathing GI: soft, nontender, nondistended, + BS MS: no deformity or atrophy Skin: warm and dry, no rash, right radial cath site without hematoma or ecchymosis; good pulse. Neuro:  Alert and Oriented x 3, Strength and sensation are intact, follows commands Psych: euthymic mood, full affect  Wt Readings from Last 3 Encounters:  08/21/22 173 lb (78.5 kg)  08/20/22 175 lb (79.4 kg)  08/13/22 175 lb (79.4 kg)     ASSESSMENT & PLAN:   1. CAD - had some initial chest pressure post cath that resolved with higher dose of Imdur. In retrospect he felt like perhaps he may have been coming down with something those first few days. This has resolved. I discussed his interruption in Plavix with Dr. Gasper Sells. He is now back on this. EKG is stable. Dr. Gasper Sells feels it is fine to continue present maintenance dosing without re-load. I emphasized the importance of NOT stopping his blood thinners at all without talking to our office first. Continue metoprolol and Imdur as tolerated, but stop lisinopril due to low-normal BP. He requests to stay off atorvastatin but is open to trial of low dose rosuvastatin. Will recheck CMET/lipids in 8 weeks. We discussed obtaining echocardiogram for the WMA seen on cath but he wishes to defer at this time due to cost. Can revisit if new symptoms arise.  2. Prior polysubstance abuse - he quit smoking, THC, and has abstained from drug use. Congratulated him on these efforts.  3. Essential HTN - BP on softer side today, confirmed on recheck, asymptomatic. Get CBC with CMET today. Otherwise stop lisinopril and follow. Continue metoprolol and isosorbide as tolerated.  4. Hyperglycemia - get A1C with labs today.    Cardiac  Rehabilitation Eligibility Assessment  The patient is ready to start cardiac rehabilitation from  a cardiac standpoint.    Disposition: F/u with me in 3 months.   Medication Adjustments/Labs and Tests Ordered: Current medicines are reviewed at length with the patient today.  Concerns regarding medicines are outlined above. Medication changes, Labs and Tests ordered today are summarized above and listed in the Patient Instructions accessible in Encounters.   Signed, [name], PA-C  08/21/2022 10:25 AM    [place] Phone: [phone]; Fax: [phone]

## 2022-08-21 ENCOUNTER — Encounter: Payer: Self-pay | Admitting: Physician Assistant

## 2022-08-21 ENCOUNTER — Other Ambulatory Visit (HOSPITAL_COMMUNITY): Payer: Self-pay

## 2022-08-21 ENCOUNTER — Ambulatory Visit: Payer: Self-pay | Attending: Physician Assistant | Admitting: Physician Assistant

## 2022-08-21 VITALS — BP 100/70 | HR 52 | Ht 70.0 in | Wt 173.0 lb

## 2022-08-21 DIAGNOSIS — F1911 Other psychoactive substance abuse, in remission: Secondary | ICD-10-CM

## 2022-08-21 DIAGNOSIS — I2510 Atherosclerotic heart disease of native coronary artery without angina pectoris: Secondary | ICD-10-CM

## 2022-08-21 DIAGNOSIS — I10 Essential (primary) hypertension: Secondary | ICD-10-CM

## 2022-08-21 DIAGNOSIS — R739 Hyperglycemia, unspecified: Secondary | ICD-10-CM

## 2022-08-21 MED ORDER — ROSUVASTATIN CALCIUM 5 MG PO TABS: 5.0000 mg | ORAL_TABLET | Freq: Every day | ORAL | 2 refills | Status: DC

## 2022-08-21 NOTE — Patient Instructions (Signed)
Medication Instructions:  STOP Atorvastatin STOP Lisinopril START Rosuvastatin '5mg'$  take 1 tablet daily  *If you need a refill on your cardiac medications before your next appointment, please call your pharmacy*   Lab Work: TODAY-CMET, A1C & CBC 8 WEEKS CMET & LIPIDS If you have labs (blood work) drawn today and your tests are completely normal, you will receive your results only by: [place] (if you have MyChart) OR A paper copy in the mail If you have any lab test that is abnormal or we need to change your treatment, we will call you to review the results.   Testing/Procedures: NONE ORDERED   Follow-Up: At [facility], you and your health needs are our priority.  As part of our continuing mission to provide you with exceptional heart care, we have created designated Provider Care Teams.  These Care Teams include your primary Cardiologist (physician) and Advanced Practice Providers (APPs -  Physician Assistants and Nurse Practitioners) who all work together to provide you with the care you need, when you need it.  We recommend signing up for the patient portal called "MyChart".  Sign up information is provided on this After Visit Summary.  MyChart is used to connect with patients for Virtual Visits (Telemedicine).  Patients are able to view lab/test results, encounter notes, upcoming appointments, etc.  Non-urgent messages can be sent to your provider as well.   To learn more about what you can do with MyChart, go to [email or web address].    Your next appointment:   3 month(s)  The format for your next appointment:   In Person  Provider:   Melina Copa, PA-C        Other Instructions   Important Information About Sugar

## 2022-08-22 LAB — COMPREHENSIVE METABOLIC PANEL
ALT: 21 IU/L (ref 0–44)
AST: 14 IU/L (ref 0–40)
Albumin/Globulin Ratio: 2 (ref 1.2–2.2)
Albumin: 4.3 g/dL (ref 3.8–4.9)
Alkaline Phosphatase: 87 IU/L (ref 44–121)
BUN/Creatinine Ratio: 22 — ABNORMAL HIGH (ref 9–20)
BUN: 21 mg/dL (ref 6–24)
Bilirubin Total: 0.3 mg/dL (ref 0.0–1.2)
CO2: 25 mmol/L (ref 20–29)
Calcium: 9.6 mg/dL (ref 8.7–10.2)
Chloride: 103 mmol/L (ref 96–106)
Creatinine, Ser: 0.97 mg/dL (ref 0.76–1.27)
Globulin, Total: 2.2 g/dL (ref 1.5–4.5)
Glucose: 107 mg/dL — ABNORMAL HIGH (ref 70–99)
Potassium: 5.1 mmol/L (ref 3.5–5.2)
Sodium: 139 mmol/L (ref 134–144)
Total Protein: 6.5 g/dL (ref 6.0–8.5)
eGFR: 91 mL/min/{1.73_m2} (ref >59)

## 2022-08-22 LAB — CBC
Hematocrit: 42.1 % (ref 37.5–51.0)
Hemoglobin: 14.1 g/dL (ref 13.0–17.7)
MCH: 31.3 pg (ref 26.6–33.0)
MCHC: 33.5 g/dL (ref 31.5–35.7)
MCV: 94 fL (ref 79–97)
Platelets: 264 10*3/uL (ref 150–450)
RBC: 4.5 x10E6/uL (ref 4.14–5.80)
RDW: 11.9 % (ref 11.6–15.4)
WBC: 7.4 10*3/uL (ref 3.4–10.8)

## 2022-08-22 LAB — HEMOGLOBIN A1C
Est. average glucose Bld gHb Est-mCnc: 114 mg/dL
Hgb A1c MFr Bld: 5.6 % (ref 4.8–5.6)

## 2022-08-24 LAB — CHLAMYDIA/GC NAA, CONFIRMATION
Chlamydia trachomatis, NAA: NEGATIVE
Neisseria gonorrhoeae, NAA: NEGATIVE

## 2022-08-25 NOTE — Progress Notes (Signed)
Normal labs , negative for chlamydia and gonorrhea

## 2022-08-26 ENCOUNTER — Telehealth (HOSPITAL_COMMUNITY): Payer: Self-pay

## 2022-08-26 NOTE — Telephone Encounter (Signed)
Pt is not interested in the cardiac rehab program. Closed referral 

## 2022-09-10 ENCOUNTER — Ambulatory Visit: Payer: Self-pay | Admitting: Internal Medicine

## 2022-09-18 ENCOUNTER — Other Ambulatory Visit: Payer: Self-pay

## 2022-09-18 MED ORDER — ROSUVASTATIN CALCIUM 5 MG PO TABS: 5.0000 mg | ORAL_TABLET | Freq: Every day | ORAL | 11 refills | Status: DC

## 2022-10-01 ENCOUNTER — Telehealth: Payer: Self-pay | Admitting: Internal Medicine

## 2022-10-01 NOTE — Telephone Encounter (Signed)
   [facility] Pre-operative Risk Assessment    Request for surgical clearance:  What type of surgery is being performed?  Root Canal - single tooth (#4)   When is this surgery scheduled?  10/02/22 8:00 AM   What type of clearance is required (medical clearance vs. Pharmacy clearance to hold med vs. Both)?  Medical  Are there any medications that need to be held prior to surgery and how long? No    Practice name and name of physician performing surgery?  [facility]  Dr. Payton Spark   What is your office phone number? [phone]    7.   What is your office fax number [phone]  8.   Anesthesia type (None, local, MAC, general) ?  Local    Leon Allen 10/01/2022, 10:12 AM

## 2022-10-01 NOTE — Telephone Encounter (Signed)
   Patient Name: Leon Allen  DOB: [birth year] MRN: [id number]  Primary Cardiologist: Werner Lean, MD  Chart reviewed as part of pre-operative protocol coverage.   Simple dental extractions (i.e. 1-2 teeth) are considered low risk procedures per guidelines and generally do not require any specific cardiac clearance. It is also generally accepted that for simple extractions and dental cleanings, there is no need to interrupt blood thinner therapy.  SBE prophylaxis is not required for the patient from a cardiac standpoint.  I will route this recommendation to the requesting party via Epic fax function and remove from pre-op pool.  Please call with questions.  Loel Dubonnet, NP 10/01/2022, 11:23 AM

## 2022-10-16 ENCOUNTER — Ambulatory Visit: Payer: Self-pay | Attending: Physician Assistant

## 2022-10-16 DIAGNOSIS — R739 Hyperglycemia, unspecified: Secondary | ICD-10-CM

## 2022-10-16 DIAGNOSIS — I10 Essential (primary) hypertension: Secondary | ICD-10-CM

## 2022-10-16 DIAGNOSIS — I2510 Atherosclerotic heart disease of native coronary artery without angina pectoris: Secondary | ICD-10-CM

## 2022-10-16 DIAGNOSIS — F1911 Other psychoactive substance abuse, in remission: Secondary | ICD-10-CM

## 2022-10-16 LAB — LIPID PANEL
Chol/HDL Ratio: 2.9 ratio (ref 0.0–5.0)
Cholesterol, Total: 120 mg/dL (ref 100–199)
HDL: 42 mg/dL (ref >39)
LDL Chol Calc (NIH): 61 mg/dL (ref 0–99)
Triglycerides: 86 mg/dL (ref 0–149)
VLDL Cholesterol Cal: 17 mg/dL (ref 5–40)

## 2022-10-16 LAB — COMPREHENSIVE METABOLIC PANEL
ALT: 22 IU/L (ref 0–44)
AST: 19 IU/L (ref 0–40)
Albumin/Globulin Ratio: 2.5 — ABNORMAL HIGH (ref 1.2–2.2)
Albumin: 4.2 g/dL (ref 3.8–4.9)
Alkaline Phosphatase: 80 IU/L (ref 44–121)
BUN/Creatinine Ratio: 24 — ABNORMAL HIGH (ref 9–20)
BUN: 20 mg/dL (ref 6–24)
Bilirubin Total: 0.3 mg/dL (ref 0.0–1.2)
CO2: 23 mmol/L (ref 20–29)
Calcium: 8.9 mg/dL (ref 8.7–10.2)
Chloride: 106 mmol/L (ref 96–106)
Creatinine, Ser: 0.82 mg/dL (ref 0.76–1.27)
Globulin, Total: 1.7 g/dL (ref 1.5–4.5)
Glucose: 101 mg/dL — ABNORMAL HIGH (ref 70–99)
Potassium: 3.9 mmol/L (ref 3.5–5.2)
Sodium: 143 mmol/L (ref 134–144)
Total Protein: 5.9 g/dL — ABNORMAL LOW (ref 6.0–8.5)
eGFR: 102 mL/min/{1.73_m2} (ref >59)

## 2022-11-12 ENCOUNTER — Other Ambulatory Visit: Payer: Self-pay | Admitting: *Deleted

## 2022-11-12 MED ORDER — CLOPIDOGREL BISULFATE 75 MG PO TABS: 75.0000 mg | ORAL_TABLET | Freq: Every day | ORAL | 1 refills | Status: DC

## 2022-11-12 MED ORDER — PANTOPRAZOLE SODIUM 20 MG PO TBEC: 20.0000 mg | DELAYED_RELEASE_TABLET | Freq: Every day | ORAL | 0 refills | Status: DC

## 2022-12-07 NOTE — Progress Notes (Unsigned)
Cardiology Clinic Note   Patient Name: Leon Allen Date of Encounter: 12/08/2022  Primary Care Provider:  Renee Rival, FNP Primary Cardiologist:  Werner Lean, MD  Patient Profile    Leon Allen is a 57 y.o. male with a past medical history of CAD with MI s/p PCI with stent to LAD in 2005 and DES to in-stent restenosis LAD 2023, hypertension, dyslipidemia, CVA, history of polysubstance abuse who presents to the clinic today for 52-monthfollow-up.  Past Medical History    Past Medical History:  Diagnosis Date   Adrenal adenoma    bilateral   Complication of anesthesia    "@ dentist office; was overdosed"   Coronary artery disease    [place]   denies residual on 01/17/2016   Daily headache    "lately" (01/17/2016)   Gonorrhea 2015   [place]   Hematuria 12/2015   Kidney stone    Situational depression    "old girlfriend missing"   Past Surgical History:  Procedure Laterality Date   APPENDECTOMY     CORONARY ANGIOPLASTY WITH STENT PLACEMENT  2005   CORONARY STENT INTERVENTION N/A 08/13/2022   Procedure: CORONARY STENT INTERVENTION;  Surgeon: MBurnell Blanks MD;  [facility];  Service: Cardiovascular;  Laterality: N/A;   INGUINAL HERNIA REPAIR Right    LEFT HEART CATH AND CORONARY ANGIOGRAPHY N/A 08/13/2022   Procedure: LEFT HEART CATH AND CORONARY ANGIOGRAPHY;  Surgeon: MBurnell Blanks MD;  [facility];  Service: Cardiovascular;  Laterality: N/A;   LITHOTRIPSY  X 2    Allergies  Allergies  Allergen Reactions   Atorvastatin Other (See Comments)    Chest pressure    Bee Venom Swelling   Penicillins Other (See Comments)    Has patient had a PCN reaction causing immediate rash, facial/tongue/throat swelling, SOB or lightheadedness with hypotension: No Has patient had a PCN reaction causing severe rash involving mucus membranes or skin  necrosis: No Has patient had a PCN reaction that required hospitalization No Has patient had a PCN reaction occurring within the last 10 years: No If all of the above answers are "NO", then may proceed with Cephalosporin use.    History of Present Illness    WBENIAH HOMMERDINGhas a past medical history of: CAD. LHC 05/05/2004 (STEMI): Mid LAD 75 to 80%.  PCI with stent to mid LAD. Echo 01/18/2016: EF 55 to 60%. LHC 08/13/2022: Mid LAD in-stent restenosis 90%.  Proximal RCA 30%.  Proximal to mid CX 30%.  PCI with DES to mid LAD.  Mild hypokinesis of the anterior wall and apex with overall preserved LV systolic function. Hypertension. Dyslipidemia. Lipid panel 10/16/2022: LDL 61, HDL 42, TG 86, total 120. CVA. History of polysubstance abuse.  Mr. Leon Barthwas first seen by Dr. NAcie Fredricksonwhen he was brought emergently to the catheterization lab for STEMI on 05/05/2004.  He underwent PCI with stent to mid LAD.  It appears he was lost to follow-up until evaluation during hospitalization for nonexertional chest pain at the end of March 2017.  Troponins were negative x 2.  EKG with conversion.  Echo was normal (as outlined above).  Nuclear stress test showed no reversible ischemia or infarction.  Patient was not seen again until office visit on 07/07/2022 with Dr. CGasper Sells  That time he complained of returned chest pain similar to pain with prior stent.  He was unable to take  nitroglycerin as it was expired.  Plan was for [facility] however patient with financial concerns so treated medically while ascertaining cost of cardiac catheterization.  Patient was started on Imdur and as needed SL NTG was refilled.  He returned for follow-up a month later with slightly improved but still present chest pain.  He had been unable to obtain beta-blocker secondary to cost.  He had been taking Imdur 60 mg and aspirin x 3.  He was having kidney pain he felt was due to Imdur and was eating peaches to manage it.  He was able to  schedule LHC after obtaining financial assistance which he underwent in October 2023.  Heart catheterization resulted in PCI with DES to mid LAD (as outlined above).  He was last seen in the office by Melina Copa PA-C on 08/21/2022 for post cath follow-up.  A few days prior to visit he called the office reporting continued chest pressure he attributed to atorvastatin.  Imdur was increased and atorvastatin was stopped at that time.  In the office he admitted he might have been coming down with an infection that was causing his symptoms and was feeling greatly improved at that time.  He reported he had stopped his Plavix for about 4 days when he felt medications were contributing to his symptoms.  He restarted Plavix on the day of his visit and it was decided with Dr. Gasper Sells that patient did not need reload.  Patient was instructed to stop lisinopril secondary to low normal BP.  He requested to remain off of atorvastatin and was started on a trial of low-dose rosuvastatin.  Today, patient denies chest pain, shortness of breath, DOE, lower extremity edema, orthopnea or PND. He is tolerating his medications and reports compliance. However, he reports he is taking 1.5 tablets of Imdur 58m tablets daily instead of 995mdaily. 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 He is no longer smoking cigarettes but has gone back to smoking marijuana "just a little bit" at night secondary to having weird dreams throughout the night without it. He does complain of a 3-4 week history of numbness and tingling down bilateral arms from shoulder to elbow on the inside of his arm. He shares that 5 years ago he had a tree limb fall onto the top of his head. He was told he did not have a neck injury, however for a couple of years after this incident he felt and heard "grinding" in his neck when he turned his head. That sensation went away but a couple of months ago he had a recurrence of neck pain without injury that  resolved on its own. He reports he notices the numbness and tingling at random times but mostly when he is not doing much activity. HE has not had any slurred speech, blurred vision or other focal neurologic deficits. Patient reports he is planning on taking an extra isosorbide at night to see if it helps the tingling and numbness. When asked for clarification he states the numbness and tingling does not occur more frequently at night and does not disturb his sleep.   Patient reports a friend of his gave him Cialis which he took about a week ago. He is inquiring about getting a prescription for it. Had a long discussion about the danger of taking Cialis or similar medications while on isosorbide. He reports he is not married so this is something he would only take every once in a while. Reiterated the danger of dropping  BP and passing out. Patient states, "If I were married this would be a bigger problem and I would just stop taking the isosorbide for a couple of days and before taking Cialis then restart isosorbide after." Again explained that would not be recommended. Offered to discuss further with Dr. Gasper Sells regarding coming off isosorbide entirely so he can take Cialis as needed. He declined stating that he understands the dangers and would like to stay on isosorbide.     Home Medications    Current Meds  Medication Sig   Ascorbic Acid (VITAMIN C PO) Take 2,000 mg by mouth daily. 1000 mg each   aspirin 81 MG chewable tablet Chew 1 tablet (81 mg total) by mouth daily. (Patient taking differently: Chew 324 mg by mouth daily.)   clopidogrel (PLAVIX) 75 MG tablet Take 1 tablet (75 mg total) by mouth daily with breakfast.   Cyanocobalamin (VITAMIN B-12 PO) Take 2,500 mcg by mouth daily.   isosorbide mononitrate (IMDUR) 60 MG 24 hr tablet Take 45 mg by mouth daily.   metoprolol succinate (TOPROL-XL) 25 MG 24 hr tablet Take 1 tablet (25 mg total) by mouth daily.   nitroGLYCERIN (NITROSTAT) 0.4  MG SL tablet Place 1 tablet (0.4 mg total) under the tongue every 5 (five) minutes as needed for chest pain.   Omega-3 Fatty Acids (FISH OIL) 1000 MG CAPS Take 2,000 mg by mouth daily.   pantoprazole (PROTONIX) 20 MG tablet Take 1 tablet (20 mg total) by mouth daily.   rosuvastatin (CRESTOR) 5 MG tablet Take 1 tablet (5 mg total) by mouth daily.   [DISCONTINUED] isosorbide mononitrate (IMDUR) 30 MG 24 hr tablet Take 3 tablets (90 mg total) by mouth daily.    Family History    Family History  Problem Relation Age of Onset   Cancer Mother        Hodgkins lymphoma   Breast cancer Sister    Heart attack Other    He indicated that his mother is deceased. He indicated that his father is deceased. He indicated that his sister is alive. He indicated that the status of his other is unknown.   Social History    Social History   Socioeconomic History   Marital status: Divorced    Spouse name: Not on file   Number of children: 2   Years of education: Not on file   Highest education level: Not on file  Occupational History   Not on file  Tobacco Use   Smoking status: Former    Packs/day: 3.00    Years: 40.00    Total pack years: 120.00    Types: Cigarettes    Quit date: 10/21/2015    Years since quitting: 7.1   Smokeless tobacco: Former    Types: Chew   Tobacco comments:    "chewed some when I was young; not for long"  Substance and Sexual Activity   Alcohol use: Yes    Alcohol/week: 0.0 standard drinks of alcohol    Comment: 01/17/2016 "I'll drink beer or a mixed drink a few times/year"   Drug use: Yes    Types: Marijuana    Comment: 01/17/2016 "stopped marijuana 10/21/2015"   Sexual activity: Yes  Other Topics Concern   Not on file  Social History Narrative   Lives home alone   Social Determinants of Health   Financial Resource Strain: High Risk (07/07/2022)   Overall Financial Resource Strain (CARDIA)    Difficulty of Paying Living Expenses: Hard  Food Insecurity:  No Food  Insecurity (07/07/2022)   Hunger Vital Sign    Worried About Running Out of Food in the Last Year: Never true    Ran Out of Food in the Last Year: Never true  Transportation Needs: No Transportation Needs (07/07/2022)   PRAPARE - Hydrologist (Medical): No    Lack of Transportation (Non-Medical): No  Physical Activity: Sufficiently Active (08/20/2022)   Exercise Vital Sign    Days of Exercise per Week: 7 days    Minutes of Exercise per Session: 60 min  Stress: No Stress Concern Present (08/20/2022)   [place]    Feeling of Stress : Only a little  Social Connections: Socially Isolated (08/20/2022)   Social Connection and Isolation Panel [NHANES]    Frequency of Communication with Friends and Family: More than three times a week    Frequency of Social Gatherings with Friends and Family: Twice a week    Attends Religious Services: Never    Marine scientist or Organizations: No    Attends Archivist Meetings: Never    Marital Status: Divorced  Human resources officer Violence: Not At Risk (08/20/2022)   Humiliation, Afraid, Rape, and Kick questionnaire    Fear of Current or Ex-Partner: No    Emotionally Abused: No    Physically Abused: No    Sexually Abused: No     Review of Systems    General:  No chills, fever, night sweats or weight changes.  Cardiovascular:  No chest pain, dyspnea on exertion, edema, orthopnea, palpitations, paroxysmal nocturnal dyspnea. Dermatological: No rash, lesions/masses Respiratory: No cough, dyspnea Urologic: No hematuria, dysuria Abdominal:   No nausea, vomiting, diarrhea, bright red blood per rectum, melena, or hematemesis Neurologic:  No visual changes, weakness, changes in mental status. All other systems reviewed and are otherwise negative except as noted above.  Physical Exam    VS:  BP 134/78   Pulse (!) 56   Ht 5' 10"$  (1.778 m)   Wt 183 lb  3.2 oz (83.1 kg)   SpO2 98%   BMI 26.29 kg/m  , BMI Body mass index is 26.29 kg/m. GEN:  Well nourished, well developed, in no acute distress. HEENT: Normal. Neck: Supple, no JVD, carotid bruits, or masses. Cardiac: RRR, no murmurs, rubs, or gallops. No clubbing, cyanosis, edema.  Radials/DP/PT 2+ and equal bilaterally.  Respiratory:  Respirations regular and unlabored, clear to auscultation bilaterally. GI: Soft, nontender, nondistended. MS: No deformity or atrophy. Skin: Warm and dry, no rash. Neuro: Strength and sensation are intact. Psych: Normal affect.  Accessory Clinical Findings     Recent Labs: 08/21/2022: Hemoglobin 14.1; Platelets 264 10/16/2022: ALT 22; BUN 20; Creatinine, Ser 0.82; Potassium 3.9; Sodium 143   Recent Lipid Panel    Component Value Date/Time   CHOL 120 10/16/2022 0746   TRIG 86 10/16/2022 0746   HDL 42 10/16/2022 0746   CHOLHDL 2.9 10/16/2022 0746   CHOLHDL 3.9 01/17/2016 1954   VLDL 12 01/17/2016 1954   LDLCALC 61 10/16/2022 0746     ECG is not indicated today.    Assessment & Plan   CAD.  S/p LHC October 2023 PCI with DES to mid LAD in-stent restenosis (previously placed stent in 2005).  Patient denies chest pain, pressure or tightness. 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 Continue aspirin, Plavix, Imdur, metoprolol, Crestor, as needed SL NTG. Patient is taking 45 mg  of isosorbide instead of 57m as listed. Will update rx to reflect this. Numbness and tingling in bilateral upper extremities. Patient reports a 3-4 week history of numbness and tingling in bilateral arms from shoulder to elbow. This occurs randomly and resolves on its own. He feels he notices it more when he is less active. Pain is not increased at night and does not disturb his sleep. He states 5 years ago he had a tree limb land on his head and was told he did not sustain any injuries of his neck. For 2 years following he heard/felt  grinding in his neck. It  resolved on its own but 3 months ago he had pain in his neck and head without injury/trauma that also resolved on its own. He has not had his neck evaluated since his initial injury. He reports he is going to take an extra dose of isosorbide to see if it helps despite the pain not occurring while in bed and not disturbing sleep. Educated patient that this would likely not help his symptoms. Suggested taking isosorbide at night if he thinks it will help. Suggested he see PCP for further evaluation. No other focal neurologic symptoms. He states he only goes to Urgent Care. Offered neurosurgery referral and he agrees.  Hypertension.  BP today 134/78. Given that he had issues with low BP recently, we'll keep an eye on this. Patient denies headaches or dizziness.  Continue metoprolol. Hyperlipidemia.  LDL December 2023 61, at goal. He is intolerant to atorvastatin reporting chest pressure while taking it. He is tolerating low dose rosuvastatin. Continue rosuvastatin 5 mg daily. Marijuana Use. Patient reports he stared back smoking "just a little bit" of marijuana at night right before bed. He states he was having strange dreams without it. Cessation encouraged.  Medication management. Patient requests prescription for Cialis. He got it from a friend and used it last week. Discussed dangers of using this medication with isosorbide. Patient states, "If I were married this would be a bigger problem and I would just stop taking the isosorbide for a couple of days and before taking Cialis then restart isosorbide after." Again explained that would not be recommended. Offered to discuss further with Dr. CGasper Sellsregarding coming off isosorbide entirely so he can take Cialis as needed. He declined stating that he understands the dangers and would like to stay on isosorbide. Encouraged patient to not take medications not prescribed to him. Instructed him to call office if he changes his mind.  Disposition: Referral to  neurosurgery. Return in 6 months or sooner as needed.    DJustice Britain Brittie Whisnant, DNP, NP-C     12/08/2022, 4:10 PM [facility] [id number] Office [phone] ([phone]

## 2022-12-08 ENCOUNTER — Encounter: Payer: Self-pay | Admitting: Student

## 2022-12-08 ENCOUNTER — Ambulatory Visit: Payer: Self-pay | Attending: Physician Assistant | Admitting: Student

## 2022-12-08 VITALS — BP 134/78 | HR 56 | Ht 70.0 in | Wt 183.2 lb

## 2022-12-08 DIAGNOSIS — I10 Essential (primary) hypertension: Secondary | ICD-10-CM

## 2022-12-08 DIAGNOSIS — R200 Anesthesia of skin: Secondary | ICD-10-CM

## 2022-12-08 DIAGNOSIS — I2510 Atherosclerotic heart disease of native coronary artery without angina pectoris: Secondary | ICD-10-CM

## 2022-12-08 DIAGNOSIS — E785 Hyperlipidemia, unspecified: Secondary | ICD-10-CM

## 2022-12-08 DIAGNOSIS — F1290 Cannabis use, unspecified, uncomplicated: Secondary | ICD-10-CM

## 2022-12-08 DIAGNOSIS — R202 Paresthesia of skin: Secondary | ICD-10-CM

## 2022-12-08 DIAGNOSIS — Z79899 Other long term (current) drug therapy: Secondary | ICD-10-CM

## 2022-12-08 MED ORDER — ISOSORBIDE MONONITRATE ER 30 MG PO TB24: 45.0000 mg | ORAL_TABLET | Freq: Every day | ORAL | 1 refills | Status: DC

## 2022-12-08 NOTE — Patient Instructions (Addendum)
Medication Instructions:  Your physician recommends that you continue on your current medications as directed. Please refer to the Current Medication list given to you today.  *If you need a refill on your cardiac medications before your next appointment, please call your pharmacy*   Lab Work: None ordered  If you have labs (blood work) drawn today and your tests are completely normal, you will receive your results only by: [place] (if you have MyChart) OR A paper copy in the mail If you have any lab test that is abnormal or we need to change your treatment, we will call you to review the results.   Testing/Procedures: None ordered  You have been referred to [facility].  They will contact you with an appointment.   Follow-Up: At [facility], you and your health needs are our priority.  As part of our continuing mission to provide you with exceptional heart care, we have created designated Provider Care Teams.  These Care Teams include your primary Cardiologist (physician) and Advanced Practice Providers (APPs -  Physician Assistants and Nurse Practitioners) who all work together to provide you with the care you need, when you need it.  We recommend signing up for the patient portal called "MyChart".  Sign up information is provided on this After Visit Summary.  MyChart is used to connect with patients for Virtual Visits (Telemedicine).  Patients are able to view lab/test results, encounter notes, upcoming appointments, etc.  Non-urgent messages can be sent to your provider as well.   To learn more about what you can do with MyChart, go to [email or web address].    Your next appointment:   6 month(s)  Provider:   Werner Lean, MD     Other Instructions

## 2022-12-09 NOTE — Addendum Note (Signed)
Addended by: Gaetano Net on: 12/09/2022 06:49 AM   Modules accepted: Orders

## 2022-12-18 ENCOUNTER — Encounter (HOSPITAL_COMMUNITY): Payer: Self-pay | Admitting: Emergency Medicine

## 2022-12-18 ENCOUNTER — Other Ambulatory Visit: Payer: Self-pay

## 2022-12-18 ENCOUNTER — Ambulatory Visit (HOSPITAL_COMMUNITY)
Admission: EM | Admit: 2022-12-18 | Discharge: 2022-12-18 | Disposition: A | Discharge status: Home / Self Care | Payer: Self-pay | Attending: Emergency Medicine | Admitting: Emergency Medicine

## 2022-12-18 DIAGNOSIS — R310 Gross hematuria: Secondary | ICD-10-CM

## 2022-12-18 DIAGNOSIS — N390 Urinary tract infection, site not specified: Secondary | ICD-10-CM

## 2022-12-18 DIAGNOSIS — Z87442 Personal history of urinary calculi: Secondary | ICD-10-CM

## 2022-12-18 DIAGNOSIS — Z113 Encounter for screening for infections with a predominantly sexual mode of transmission: Secondary | ICD-10-CM

## 2022-12-18 LAB — POCT URINALYSIS DIPSTICK, ED / UC
Bilirubin Urine: NEGATIVE
Glucose, UA: NEGATIVE mg/dL
Ketones, ur: NEGATIVE mg/dL
Nitrite: POSITIVE — AB
Protein, ur: 100 mg/dL — AB
Specific Gravity, Urine: >=1.03 (ref 1.005–1.030)
Urobilinogen, UA: 0.2 mg/dL (ref 0.0–1.0)
pH: 5.5 (ref 5.0–8.0)

## 2022-12-18 MED ORDER — SULFAMETHOXAZOLE-TRIMETHOPRIM 800-160 MG PO TABS: 1.0000 | ORAL_TABLET | Freq: Two times a day (BID) | ORAL | 0 refills | Status: AC

## 2022-12-18 MED ORDER — CEFTRIAXONE SODIUM 1 G IJ SOLR
INTRAMUSCULAR | Status: AC
  Filled 2022-12-18: qty 10

## 2022-12-18 MED ORDER — HYDROCODONE-ACETAMINOPHEN 5-325 MG PO TABS: 1.0000 | ORAL_TABLET | Freq: Four times a day (QID) | ORAL | 0 refills | Status: DC | PRN

## 2022-12-18 MED ORDER — CEFTRIAXONE SODIUM 1 G IJ SOLR
1.0000 g | Freq: Once | INTRAMUSCULAR | Status: AC
  Administered 2022-12-18: 1 g via INTRAMUSCULAR

## 2022-12-18 MED ORDER — LIDOCAINE HCL (PF) 1 % IJ SOLN
INTRAMUSCULAR | Status: AC
  Filled 2022-12-18: qty 2

## 2022-12-18 NOTE — ED Provider Notes (Signed)
HPI  SUBJECTIVE:  Leon Allen is a 57 y.o. male who presents with a "kidney stone versus STD."  He reports 4 to 5 days of dysuria, urinary urgency, frequency, cloudy urine, hematuria and urethral bleeding.  He reports seeing particulate matter in his urine today.  He reports right back pain and states that he feels as if he is having difficulty completely emptying his bladder each time he urinates.  States he has difficulty making it to the bathroom on time because of the urgency.  No nausea, vomiting, fevers, abdominal, flank, pelvic pain.  No penile rash, discharge, testicular, epididymal, scrotal, perineal swelling or pain.  He states that he was feeling better 2 days ago, was fine for a day, but then symptoms returned.  States he does not feel anything moving, which he usually feels with previous kidney stones.  He just got back together with his male sexual partner 3 weeks ago and has been having unprotected intercourse with her.  She denies having any other partners, and is asymptomatic.  He tried ibuprofen 800 mg at 1000 mg of Tylenol without improvement in his symptoms.  Symptoms are worse when he tries to urinate.  He has a past medical history of CVA x 2, coronary disease, MI x 2 status post stents, on Plavix, hypertension, gonorrhea x 2, UTI, pyelonephritis, hematuria, obstructing nephrolithiasis with lithotripsy x 2.  He has a known right kidney stone.  Urology: [facility].    Past Medical History:  Diagnosis Date   Adrenal adenoma    bilateral   Complication of anesthesia    "@ dentist office; was overdosed"   Coronary artery disease    CVA (cerebral vascular accident) ([place]) 2015   denies residual on 01/17/2016   Daily headache    "lately" (01/17/2016)   Gonorrhea 2015   Heart attack ([place]) 2005   Hematuria 12/2015   Kidney stone    Situational depression    "old girlfriend missing"    Past Surgical History:  Procedure Laterality Date   APPENDECTOMY     CORONARY  ANGIOPLASTY WITH STENT PLACEMENT  2005   CORONARY STENT INTERVENTION N/A 08/13/2022   Procedure: CORONARY STENT INTERVENTION;  Surgeon: Burnell Blanks, MD;  Location: [facility];  Service: Cardiovascular;  Laterality: N/A;   INGUINAL HERNIA REPAIR Right    LEFT HEART CATH AND CORONARY ANGIOGRAPHY N/A 08/13/2022   Procedure: LEFT HEART CATH AND CORONARY ANGIOGRAPHY;  Surgeon: Burnell Blanks, MD;  Location: [facility];  Service: Cardiovascular;  Laterality: N/A;   LITHOTRIPSY  X 2    Family History  Problem Relation Age of Onset   Cancer Mother        Hodgkins lymphoma   Breast cancer Sister    Heart attack Other     Social History   Tobacco Use   Smoking status: Former    Packs/day: 3.00    Years: 40.00    Total pack years: 120.00    Types: Cigarettes    Quit date: 10/21/2015    Years since quitting: 7.1   Smokeless tobacco: Former    Types: Chew   Tobacco comments:    "chewed some when I was young; not for long"  Vaping Use   Vaping Use: Never used  Substance Use Topics   Alcohol use: Yes    Alcohol/week: 0.0 standard drinks of alcohol    Comment: 01/17/2016 "I'll drink beer or a mixed drink a few times/year"   Drug use:  Yes    Types: Marijuana    Comment: 01/17/2016 "stopped marijuana 10/21/2015"    No current facility-administered medications for this encounter.  Current Outpatient Medications:    HYDROcodone-acetaminophen (NORCO/VICODIN) 5-325 MG tablet, Take 1-2 tablets by mouth every 6 (six) hours as needed for moderate pain., Disp: 12 tablet, Rfl: 0   sulfamethoxazole-trimethoprim (BACTRIM DS) 800-160 MG tablet, Take 1 tablet by mouth 2 (two) times daily for 10 days., Disp: 20 tablet, Rfl: 0   Ascorbic Acid (VITAMIN C PO), Take 2,000 mg by mouth daily. 1000 mg each, Disp: , Rfl:    aspirin 81 MG chewable tablet, Chew 1 tablet (81 mg total) by mouth daily. (Patient taking differently: Chew 324 mg by mouth daily.), Disp: 90 tablet, Rfl:  1   clopidogrel (PLAVIX) 75 MG tablet, Take 1 tablet (75 mg total) by mouth daily with breakfast., Disp: 90 tablet, Rfl: 1   Cyanocobalamin (VITAMIN B-12 PO), Take 2,500 mcg by mouth daily., Disp: , Rfl:    isosorbide mononitrate (IMDUR) 30 MG 24 hr tablet, Take 1.5 tablets (45 mg total) by mouth daily., Disp: 135 tablet, Rfl: 1   metoprolol succinate (TOPROL-XL) 25 MG 24 hr tablet, Take 1 tablet (25 mg total) by mouth daily., Disp: 90 tablet, Rfl: 3   nitroGLYCERIN (NITROSTAT) 0.4 MG SL tablet, Place 1 tablet (0.4 mg total) under the tongue every 5 (five) minutes as needed for chest pain., Disp: 25 tablet, Rfl: 3   Omega-3 Fatty Acids (FISH OIL) 1000 MG CAPS, Take 2,000 mg by mouth daily., Disp: , Rfl:    pantoprazole (PROTONIX) 20 MG tablet, Take 1 tablet (20 mg total) by mouth daily., Disp: 90 tablet, Rfl: 0   rosuvastatin (CRESTOR) 5 MG tablet, Take 1 tablet (5 mg total) by mouth daily., Disp: 30 tablet, Rfl: 11  Allergies  Allergen Reactions   Atorvastatin Other (See Comments)    Chest pressure    Bee Venom Swelling   Penicillins Other (See Comments)    Has patient had a PCN reaction causing immediate rash, facial/tongue/throat swelling, SOB or lightheadedness with hypotension: No Has patient had a PCN reaction causing severe rash involving mucus membranes or skin necrosis: No Has patient had a PCN reaction that required hospitalization No Has patient had a PCN reaction occurring within the last 10 years: No If all of the above answers are "NO", then may proceed with Cephalosporin use.     ROS  As noted in HPI.   Physical Exam  BP (!) 150/94 (BP Location: Left Arm)   Pulse 70   Temp 98.5 F (36.9 C) (Oral)   Resp 20   SpO2 98%   Constitutional: Well developed, well nourished, no acute distress Eyes:  EOMI, conjunctiva normal bilaterally HENT: Normocephalic, atraumatic,mucus membranes moist Respiratory: Normal inspiratory effort Cardiovascular: Normal rate GI:  nondistended.  Soft.  No suprapubic, flank tenderness. Back: Questionable right CVAT GU: Urethral stricture.  Blood at the meatus.  No penile discharge, rash.  Normal testicles.  No testicular tenderness, swelling, epididymal, scrotal tenderness or swelling.  Patient declined chaperone Rectal, normal size, firm, nontender prostate.  Patient declined chaperone skin: No rash, skin intact Musculoskeletal: no deformities Neurologic: Alert & oriented x 3, no focal neuro deficits Psychiatric: Speech and behavior appropriate   ED Course   Medications  cefTRIAXone (ROCEPHIN) injection 1 g (1 g Intramuscular Given 12/18/22 1749)    Orders Placed This Encounter  Procedures   Urine Culture    Standing Status:   Standing  Number of Occurrences:   1    Order Specific Question:   Indication    Answer:   Dysuria   POCT Urinalysis Dipstick (ED/UC)    Standing Status:   Standing    Number of Occurrences:   1    Results for orders placed or performed during the hospital encounter of 12/18/22 (from the past 24 hour(s))  POCT Urinalysis Dipstick (ED/UC)     Status: Abnormal   Collection Time: 12/18/22  4:36 PM  Result Value Ref Range   Glucose, UA NEGATIVE NEGATIVE mg/dL   Bilirubin Urine NEGATIVE NEGATIVE   Ketones, ur NEGATIVE NEGATIVE mg/dL   Specific Gravity, Urine >=1.030 1.005 - 1.030   Hgb urine dipstick MODERATE (A) NEGATIVE   pH 5.5 5.0 - 8.0   Protein, ur 100 (A) NEGATIVE mg/dL   Urobilinogen, UA 0.2 0.0 - 1.0 mg/dL   Nitrite POSITIVE (A) NEGATIVE   Leukocytes,Ua SMALL (A) NEGATIVE   No results found.  ED Clinical Impression  1. Complicated UTI (urinary tract infection)   2. Gross hematuria   3. History of nephrolithiasis   4. Screening for STDs (sexually transmitted diseases)      ED Assessment/Plan     Patient has moderate hematuria, positive nitrites and esterase.  Will send this off for culture to confirm provisional diagnosis of a complicated UTI.  Also sending  off swab for gonorrhea, chlamydia, trichomonas.  He tested negative for gonorrhea and chlamydia November 23  Differential includes nephrolithiasis, pyelonephritis, complicated UTI, STI.  He appears nontoxic.  Will give 1 g of Rocephin here to cover pyelonephritis and gonorrhea, and send home with Bactrim DS 1 tab twice daily for 10 days to cover an infected stone/pyelonephritis.  Patient states that he has been treated for gonorrhea multiple times in the past without any issues.  Had a long discussion with patient that we will contact him if his other labs came back abnormal and we need to treat him additionally for STIs.  He is on nitrates, so deferring Flomax.  He declined a KUB.  Home with Tylenol containing product 3 to 4 times a day.  Either 1000 mg of Tylenol or 1-2 Norco.  Discussed with him to stop taking the ibuprofen and other NSAIDs.  Push fluids.  Follow-up with his urologist.  Strict ER return precautions given.  [place] Narcotic database reviewed for this patient, and feel that the risk/benefit ratio today is favorable for proceeding with a prescription for controlled substance.  No opiate prescriptions in the past 2 years.   Discussed labs,  MDM, treatment plan, and plan for follow-up with patient. Discussed sn/sx that should prompt return to the ED. patient agrees with plan.   Meds ordered this encounter  Medications   cefTRIAXone (ROCEPHIN) injection 1 g   sulfamethoxazole-trimethoprim (BACTRIM DS) 800-160 MG tablet    Sig: Take 1 tablet by mouth 2 (two) times daily for 10 days.    Dispense:  20 tablet    Refill:  0   HYDROcodone-acetaminophen (NORCO/VICODIN) 5-325 MG tablet    Sig: Take 1-2 tablets by mouth every 6 (six) hours as needed for moderate pain.    Dispense:  12 tablet    Refill:  0      *This clinic note was created using [employer]. Therefore, there may be occasional mistakes despite careful proofreading.  ?    Melynda Ripple, MD 12/18/22  [phone]

## 2022-12-18 NOTE — Discharge Instructions (Signed)
I have given you 1000 mg of Rocephin here today to cover for complicated urinary tract infection.  This will also treat gonorrhea.  Finish the Bactrim, even if you feel better.  Push extra fluids.  Stop all NSAIDs including ibuprofen.  You may take a Tylenol containing product 3-4 times a day.  Either 1000 mg of Tylenol or 1-2 Norco.  Do not take Tylenol and Norco as they both have Tylenol in them and too much Tylenol can hurt your liver.  Do not exceed 4000 mg of Tylenol from all sources in 1 day.  Contact you if your labs come back abnormal and you require further treatment.  Refrain from intercourse until all your labs are resulted and your partner is treated if necessary.  Please follow-up with your urologist ASAP.  Go to the ER for the signs and symptoms we discussed

## 2022-12-18 NOTE — ED Triage Notes (Signed)
Current pain started 4 days ago.  After 2 days felt better, then it worsened again.  Patient reports burning with urination.  Has had urgency.  Patient is urinating more frequently.

## 2022-12-19 ENCOUNTER — Ambulatory Visit: Payer: Self-pay | Admitting: Nurse Practitioner

## 2022-12-19 LAB — CYTOLOGY, (ORAL, ANAL, URETHRAL) ANCILLARY ONLY
Chlamydia: NEGATIVE
Comment: NEGATIVE
Comment: NEGATIVE
Comment: NORMAL
Neisseria Gonorrhea: NEGATIVE
Trichomonas: NEGATIVE

## 2022-12-21 LAB — URINE CULTURE: Culture: >=100000 — AB

## 2023-01-01 ENCOUNTER — Other Ambulatory Visit (HOSPITAL_COMMUNITY): Payer: Self-pay | Admitting: Neurosurgery

## 2023-01-01 DIAGNOSIS — G959 Disease of spinal cord, unspecified: Secondary | ICD-10-CM

## 2023-01-14 ENCOUNTER — Ambulatory Visit (HOSPITAL_COMMUNITY): Payer: Self-pay | Attending: Neurosurgery

## 2023-01-14 ENCOUNTER — Encounter (HOSPITAL_COMMUNITY): Payer: Self-pay

## 2023-02-07 ENCOUNTER — Other Ambulatory Visit: Payer: Self-pay | Admitting: Internal Medicine

## 2023-02-12 ENCOUNTER — Other Ambulatory Visit: Payer: Self-pay | Admitting: Urology

## 2023-02-12 DIAGNOSIS — R1011 Right upper quadrant pain: Secondary | ICD-10-CM

## 2023-02-16 ENCOUNTER — Ambulatory Visit (HOSPITAL_COMMUNITY)
Admission: RE | Admit: 2023-02-16 | Discharge: 2023-02-16 | Disposition: A | Discharge status: Home / Self Care | Payer: Medicaid Other | Source: Ambulatory Visit | Attending: Urology | Admitting: Urology

## 2023-02-16 DIAGNOSIS — R1011 Right upper quadrant pain: Secondary | ICD-10-CM | POA: Diagnosis not present

## 2023-02-19 ENCOUNTER — Ambulatory Visit (HOSPITAL_COMMUNITY)
Admission: RE | Admit: 2023-02-19 | Discharge: 2023-02-19 | Disposition: A | Discharge status: Home / Self Care | Payer: Medicaid Other | Source: Ambulatory Visit | Attending: Neurosurgery | Admitting: Neurosurgery

## 2023-02-19 DIAGNOSIS — G959 Disease of spinal cord, unspecified: Secondary | ICD-10-CM

## 2023-02-24 DIAGNOSIS — M5020 Other cervical disc displacement, unspecified cervical region: Secondary | ICD-10-CM | POA: Diagnosis not present

## 2023-02-24 DIAGNOSIS — Z6825 Body mass index (BMI) 25.0-25.9, adult: Secondary | ICD-10-CM | POA: Diagnosis not present

## 2023-02-24 DIAGNOSIS — G959 Disease of spinal cord, unspecified: Secondary | ICD-10-CM | POA: Diagnosis not present

## 2023-02-25 ENCOUNTER — Other Ambulatory Visit: Payer: Self-pay | Admitting: Neurosurgery

## 2023-02-25 ENCOUNTER — Telehealth: Payer: Self-pay | Admitting: Internal Medicine

## 2023-02-25 NOTE — Progress Notes (Unsigned)
Cardiology Office Note:    Date:  02/26/2023   ID:  Leon Allen, DOB 1966/04/15, MRN [id number]  PCP:  Oneita Hurt, No   [facility] Providers Cardiologist:  Christell Constant, MD     Referring MD: No ref. provider found   Chief Complaint: Preoperative cardiac evaluation  History of Present Illness:    Leon Allen is a  57 y.o. male with a hx of CAD, HTN, HLD, CVA, polysubstance abuse, and adrenal adenoma.  History of STEMI 05/05/2004 with PCI/stent to mid LAD. He was lost in follow-up until hospitalization for nonexertional chest pain at the end of March 2017. Troponins were negative x 2.  Nuclear stress test showed no reversible ischemia or infarction.  He was again lost to follow-up until cardiology clinic visit with Dr. Raynelle Jan 07/07/2022.  He reported chest pain similar to pain prior to previous stent. He did not take nitroglycerin as it was expired. Plan was for [facility] however he had financial concerns so he was treated medically while ascertaining cost of cardiac catheterization. He was started on Imdur and as needed sublingual nitroglycerin.  He will return for follow-up 1 month later with slight improvement but he continued to have pain.  He did not get beta-blocker due to cost.  He was taking Imdur 60 mg and aspirin x 3 daily.  He was having kidney pain he felt was due to indoor and was eating peaches to manage it.  He was able to schedule LHC after obtaining financial assistance which he underwent in October 2023.  LHC revealed severe restenosis mid LAD stent (Taxus stent) with successful PTCA/DES x 1 mid LAD covering the entire old stented segment, mild nonobstructive disease in the proximal RCA and mid circumflex, mild hypokinesis of the anterior wall and apex with overall preserved LV systolic function.  Recommendations DAPT with aspirin and Plavix for at least 6 months but longer if tolerated.  Seen in follow-up by Ronie Spies, PA on 08/21/2022.  A few days prior he had  called the office reporting continued chest pressure he attributed to atorvastatin.  Imdur was increased and atorvastatin was stopped at that time.  In the office he admitted he might have been coming down with an infection causing symptoms and was feeling better at the time of the office visit.  He had stopped Plavix for about 4 days to see if it was contributing to symptoms.  It was decided that he would not be reloaded.  He was instructed to stop lisinopril due to soft blood pressure.  He was started on a trial low-dose of rosuvastatin.  Seen in clinic by Carlos Levering, NP on 12/08/2022.  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  He was taking 1.5 tablets of Imdur 30 mg instead of 90 mg daily.  He was no longer smoking cigarettes but had gone back to smoking marijuana "just a little bit" at night secondary to having weird dreams without it.  He reported numbness and tingling down both arms from shoulder to elbow on the inside of the arm.  Shared that 5 years prior a tree limb fell on top of his head.  He was told he did not have a neck injury, however after a couple of years he heard grinding in his neck when he turns his head. Numbness and tingling occurs more frequently at night.  He has been given Cialis by a friend a week prior and inquired about getting a prescription.  He was warned  about the danger of taking Cialis with isosorbide. After lengthy discussion, he elected to stay on isosorbide and avoid callus and similar agents.  He was referred to neurosurgery for evaluation of neck and arm pain. He was tolerating rosuvastatin 5 mg daily. Advised to return in 6 months for follow-up.  Today, he is here for preoperative cardiac evaluation for anterior cervical discectomy and fusion. Says he has had chest discomfort since he had MRI 02/19/23. Says he started hurting in his heart about 10 minutes before test ended. Did not tell anyone because he wanted to finish MRI. Thinks his heart medicine is causing  kidney infections. Only drinks water when he takes his medications. Drinks Coke all day. Drank [employer] which relieved his kidney pain. Eats a steak and has 2 margaritas about one day per week and his kidneys feel better.  Was previously working as a Academic librarian but can no longer crawl under houses due to neck pain. Has pain and numbness in right arm and leg. When questioned specifically about cardiac symptoms, he denies chest pain, shortness of breath, palpitations, orthopnea, PND, and syncope. Has nerve pain in leg that inhibits walking/running but he did try to run with his dog recently. Pain in left flank presently. Smoked marijuana for 40 years, reports he no longer smokes and wonders if it helped keep his stent open in the past. We discussed risk associated with surgery and he verbalized understanding. Says he has a lot of discomfort and wants to have this surgery before he has permanent damage.   Past Medical History:  Diagnosis Date   Adrenal adenoma    bilateral   Complication of anesthesia    "@ dentist office; was overdosed"   Coronary artery disease    CVA (cerebral vascular accident) (HCC) 2015   denies residual on 01/17/2016   Daily headache    "lately" (01/17/2016)   Gonorrhea 2015   Heart attack (HCC) 2005   Hematuria 12/2015   Kidney stone    Situational depression    "old girlfriend missing"    Past Surgical History:  Procedure Laterality Date   APPENDECTOMY     CORONARY ANGIOPLASTY WITH STENT PLACEMENT  2005   CORONARY STENT INTERVENTION N/A 08/13/2022   Procedure: CORONARY STENT INTERVENTION;  Surgeon: Kathleene Hazel, MD;  Location: [facility];  Service: Cardiovascular;  Laterality: N/A;   INGUINAL HERNIA REPAIR Right    LEFT HEART CATH AND CORONARY ANGIOGRAPHY N/A 08/13/2022   Procedure: LEFT HEART CATH AND CORONARY ANGIOGRAPHY;  Surgeon: Kathleene Hazel, MD;  Location: [facility];  Service: Cardiovascular;  Laterality: N/A;    LITHOTRIPSY  X 2    Current Medications: Current Meds  Medication Sig   clopidogrel (PLAVIX) 75 MG tablet Take 1 tablet (75 mg total) by mouth daily with breakfast.   isosorbide mononitrate (IMDUR) 30 MG 24 hr tablet Take 1.5 tablets (45 mg total) by mouth daily. (Patient taking differently: Take 60 mg by mouth daily.)   metoprolol succinate (TOPROL-XL) 25 MG 24 hr tablet Take 1 tablet (25 mg total) by mouth daily.   nitroGLYCERIN (NITROSTAT) 0.4 MG SL tablet Place 1 tablet (0.4 mg total) under the tongue every 5 (five) minutes as needed for chest pain.   pantoprazole (PROTONIX) 20 MG tablet TAKE 1 TABLET BY MOUTH EVERY DAY   rosuvastatin (CRESTOR) 5 MG tablet Take 1 tablet (5 mg total) by mouth daily.   [DISCONTINUED] Ascorbic Acid (VITAMIN C PO) Take 2,000 mg  by mouth daily. 1000 mg each   [DISCONTINUED] Cyanocobalamin (VITAMIN B-12 PO) Take 2,500 mcg by mouth daily.   [DISCONTINUED] HYDROcodone-acetaminophen (NORCO/VICODIN) 5-325 MG tablet Take 1-2 tablets by mouth every 6 (six) hours as needed for moderate pain. (Patient not taking: Reported on 02/26/2023)   [DISCONTINUED] Omega-3 Fatty Acids (FISH OIL) 1000 MG CAPS Take 2,000 mg by mouth daily.     Allergies:   Atorvastatin, Bee venom, and Penicillins   Social History   Socioeconomic History   Marital status: Divorced    Spouse name: Not on file   Number of children: 2   Years of education: Not on file   Highest education level: Not on file  Occupational History   Not on file  Tobacco Use   Smoking status: Former    Packs/day: 3.00    Years: 40.00    Additional pack years: 0.00    Total pack years: 120.00    Types: Cigarettes    Quit date: 10/21/2015    Years since quitting: 7.3   Smokeless tobacco: Former    Types: Chew   Tobacco comments:    "chewed some when I was young; not for long"  Vaping Use   Vaping Use: Never used  Substance and Sexual Activity   Alcohol use: Yes    Alcohol/week: 0.0 standard drinks of  alcohol    Comment: 01/17/2016 "I'll drink beer or a mixed drink a few times/year"   Drug use: Yes    Types: Marijuana    Comment: 01/17/2016 "stopped marijuana 10/21/2015"   Sexual activity: Yes  Other Topics Concern   Not on file  Social History Narrative   Lives home alone   Social Determinants of Health   Financial Resource Strain: High Risk (07/07/2022)   Overall Financial Resource Strain (CARDIA)    Difficulty of Paying Living Expenses: Hard  Food Insecurity: No Food Insecurity (07/07/2022)   Hunger Vital Sign    Worried About Running Out of Food in the Last Year: Never true    Ran Out of Food in the Last Year: Never true  Transportation Needs: No Transportation Needs (07/07/2022)   PRAPARE - Administrator, Civil Service (Medical): No    Lack of Transportation (Non-Medical): No  Physical Activity: Sufficiently Active (08/20/2022)   Exercise Vital Sign    Days of Exercise per Week: 7 days    Minutes of Exercise per Session: 60 min  Stress: No Stress Concern Present (08/20/2022)   [employer] of Occupational Health - Occupational Stress Questionnaire    Feeling of Stress : Only a little  Social Connections: Socially Isolated (08/20/2022)   Social Connection and Isolation Panel [NHANES]    Frequency of Communication with Friends and Family: More than three times a week    Frequency of Social Gatherings with Friends and Family: Twice a week    Attends Religious Services: Never    Database administrator or Organizations: No    Attends Engineer, structural: Never    Marital Status: Divorced     Family History: The patient's family history includes Breast cancer in his sister; Cancer in his mother; Heart attack in an other family member.  ROS:   Please see the history of present illness.    + right arm and leg numbness All other systems reviewed and are negative.  Labs/Other Studies Reviewed:    The following studies were reviewed today:  LHC  08/13/22   Mid LAD lesion is 90% stenosed.  Prox RCA lesion is 30% stenosed.   Prox Cx to Mid Cx lesion is 30% stenosed.   A drug-eluting stent was successfully placed using a SYNERGY XD 3.50X20.   Post intervention, there is a 0% residual stenosis.   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  Successful PTCA/DES x 1 mid LAD covering the entire old stented segment Mild non-obstructive disease in the proximal RCA and mid Circumflex Mild hypokinesis of the anterior wall and apex with overall preserved LV systolic function   Recommendations: DAPT with ASA and Plavix for at least six months but longer if tolerated. Will continue statin and beta blocker.   Echo 01/18/16 Left ventricle: The cavity size was normal. Systolic function was    normal. The estimated ejection fraction was in the range of 55%    to 60%. Wall motion was normal; there were no regional wall    motion abnormalities. Left ventricular diastolic function    parameters were normal.  - Aortic valve: Trileaflet; normal thickness leaflets. There was no    regurgitation.  - Aortic root: The aortic root was normal in size.  - Mitral valve: There was no regurgitation.  - Left atrium: The atrium was normal in size.  - Right ventricle: The cavity size was normal. Wall thickness was    normal. Systolic function was normal.  - Right atrium: The atrium was normal in size.  - Tricuspid valve: There was trivial regurgitation.  - Pulmonic valve: There was no regurgitation.  - Pulmonary arteries: The main pulmonary artery was normal-sized.    Systolic pressure was within the normal range.  - Inferior vena cava: The vessel was normal in size.  - Pericardium, extracardiac: There was no pericardial effusion.   Recent Labs: 08/21/2022: Hemoglobin 14.1; Platelets 264 10/16/2022: ALT 22; BUN 20; Creatinine, Ser 0.82; Potassium 3.9; Sodium 143  Recent Lipid Panel    Component Value Date/Time   CHOL 120 10/16/2022 0746   TRIG 86  10/16/2022 0746   HDL 42 10/16/2022 0746   CHOLHDL 2.9 10/16/2022 0746   CHOLHDL 3.9 01/17/2016 1954   VLDL 12 01/17/2016 1954   LDLCALC 61 10/16/2022 0746     Risk Assessment/Calculations:       Physical Exam:    VS:  BP 132/78   Pulse 63   Ht 5\' 10"  (1.778 m)   Wt 180 lb 3.2 oz (81.7 kg)   SpO2 98%   BMI 25.86 kg/m     Wt Readings from Last 3 Encounters:  02/26/23 180 lb 3.2 oz (81.7 kg)  12/08/22 183 lb 3.2 oz (83.1 kg)  08/21/22 173 lb (78.5 kg)     GEN:  Well nourished, well developed in no acute distress HEENT: Normal NECK: No JVD; No carotid bruits CARDIAC: RRR, no murmurs, rubs, gallops RESPIRATORY:  Clear to auscultation without rales, wheezing or rhonchi  ABDOMEN: Soft, non-tender, non-distended MUSCULOSKELETAL:  No edema; No deformity. 2+ pedal pulses, equal bilaterally SKIN: Warm and dry NEUROLOGIC:  Alert and oriented x 3 PSYCHIATRIC:  Normal affect   EKG:  EKG is ordered today.  The ekg ordered today demonstrates normal sinus rhythm at 66 bpm, no ST abnormality       Diagnoses:    1. Preoperative cardiovascular examination   2. Coronary artery disease involving native coronary artery of native heart without angina pectoris   3. Hyperlipidemia LDL goal <50   4. Primary hypertension   5. History of substance abuse (HCC)    Assessment and Plan:  Preoperative cardiac evaluation: He initially complained of chest pain but upon further review of symptoms and examination he does not have symptoms concerning for angina. His risk is high due to history of CVA and ischemic heart disease. According to the Revised Cardiac Risk Index (RCRI), his Perioperative Risk of Major Cardiac Event is (%): 11. His Functional Capacity in METs is: 6.61 according to the Duke Activity Status Index (DASI). He may proceed to surgery without further cardiac testing. Ideally aspirin should be continued without interruption, however if the bleeding risk is too great, aspirin may  be held for 5-7 days prior to surgery. Please resume aspirin post operatively when it is felt to be safe from a bleeding standpoint. Per office protocol, he may hold Plavix for 5 days prior to procedure and should resume as soon as hemodynamically stable postoperatively. I will route clearance to requesting provider.   CAD without angina: Severe restenosis of mid LAD stent with successful PTCA/DES x 1 to mid LAD covering the entire old stented segment, mild nonobstructive disease in proximal RCA and mid circumflex, mild hypokinesis of the anterior wall and apex with overall preserved LV systolic function on cath 08/13/2022. He initially reported that he had chest pain since undergoing MRI on 02/19/23.  Upon further discussion and questioning, he denies chest pain, dyspnea, shortness of breath, palpitations. No symptoms concerning for angina, no indication for further ischemic evaluation at this time.  Continue GDMT including clopidogrel, isosorbide, metoprolol, rosuvastatin.  He would like to discuss stopping some of these medications with Dr. Raynelle Jan at next office visit.  He thinks they are hurting his kidneys.  Hypertension: BP is well controlled   Hyperlipidemia LDL goal < 55: LDL 61 on 10/16/22.  Continue rosuvastatin.  Substance abuse: Occasional Etoh consumption, no longer using marijuana.      Disposition: Keep your September appointment with Dr. Izora Ribas  Medication Adjustments/Labs and Tests Ordered: Current medicines are reviewed at length with the patient today.  Concerns regarding medicines are outlined above.  Orders Placed This Encounter  Procedures   EKG 12-Lead   No orders of the defined types were placed in this encounter.   Patient Instructions  Medication Instructions:   Your physician recommends that you continue on your current medications as directed. Please refer to the Current Medication list given to you today.   *If you need a refill on your cardiac  medications before your next appointment, please call your pharmacy*   Lab Work:  None ordered.  If you have labs (blood work) drawn today and your tests are completely normal, you will receive your results only by: MyChart Message (if you have MyChart) OR A paper copy in the mail If you have any lab test that is abnormal or we need to change your treatment, we will call you to review the results.   Testing/Procedures:  None ordered.   Follow-Up: At [facility], you and your health needs are our priority.  As part of our continuing mission to provide you with exceptional heart care, we have created designated Provider Care Teams.  These Care Teams include your primary Cardiologist (physician) and Advanced Practice Providers (APPs -  Physician Assistants and Nurse Practitioners) who all work together to provide you with the care you need, when you need it.  We recommend signing up for the patient portal called "MyChart".  Sign up information is provided on this After Visit Summary.  MyChart is used to connect with patients for Virtual Visits (Telemedicine).  Patients are able to view lab/test results, encounter notes, upcoming appointments, etc.  Non-urgent messages can be sent to your provider as well.   To learn more about what you can do with MyChart, go to [email or web address].    Your next appointment:   3 month(s)  Provider:   Christell Constant, MD       Signed, Levi Aland, NP  02/26/2023 3:24 PM    [facility]

## 2023-02-25 NOTE — Telephone Encounter (Signed)
Pt c/o of Chest Pain: STAT if CP now or developed within 24 hours  1. Are you having CP right now? Yes   2. Are you experiencing any other symptoms (ex. SOB, nausea, vomiting, sweating)? No   3. How long have you been experiencing CP? Since he had MRI done.   4. Is your CP continuous or coming and going? Continuous   5. Have you taken Nitroglycerin? No  ?

## 2023-02-25 NOTE — Telephone Encounter (Addendum)
Patient called stating he was experiencing chest pain while having a MRI, pt did not tell the staff because he " wanted the MRI to be completed". Pt mentioned he scheduled to surgery on 5/10 , pt wanted to know if the neurosurgeon has communicated with cardiology for clearance.  Currently there is not a cardiac clearance for the surgery. Pt mentioned several times he will like a "heart doctor to see him prior to his surgery". Attempted to redirect patient several times to currently focus on his  main symptom which is chest pain. Pt stated he did not take nitro for CP because the last time he took nitro he had a bad headache. Then pt redirect the conversation to his upcomming surgery. Made several attempts to redirect the conversation to the original reason for the phone call. Advised  pt to take the nitro as directed. Patient voiced understanding.  Called the patient back to confirm pt took the medication and the chest pain has decreased. Patient is scheduled with APP on 5/9@ 0940. Patient voiced understanding. Will forward to APP.

## 2023-02-26 ENCOUNTER — Telehealth: Payer: Self-pay | Admitting: *Deleted

## 2023-02-26 ENCOUNTER — Encounter: Payer: Self-pay | Admitting: Nurse Practitioner

## 2023-02-26 ENCOUNTER — Ambulatory Visit: Payer: Medicaid Other | Attending: Nurse Practitioner | Admitting: Nurse Practitioner

## 2023-02-26 VITALS — BP 132/78 | HR 63 | Ht 70.0 in | Wt 180.2 lb

## 2023-02-26 DIAGNOSIS — I2510 Atherosclerotic heart disease of native coronary artery without angina pectoris: Secondary | ICD-10-CM

## 2023-02-26 DIAGNOSIS — F1911 Other psychoactive substance abuse, in remission: Secondary | ICD-10-CM

## 2023-02-26 DIAGNOSIS — E785 Hyperlipidemia, unspecified: Secondary | ICD-10-CM | POA: Diagnosis not present

## 2023-02-26 DIAGNOSIS — Z01810 Encounter for preprocedural cardiovascular examination: Secondary | ICD-10-CM

## 2023-02-26 DIAGNOSIS — R310 Gross hematuria: Secondary | ICD-10-CM | POA: Diagnosis not present

## 2023-02-26 DIAGNOSIS — I10 Essential (primary) hypertension: Secondary | ICD-10-CM | POA: Diagnosis not present

## 2023-02-26 NOTE — Telephone Encounter (Signed)
   Pre-operative Risk Assessment    Patient Name: Leon Allen  DOB: [birth year] MRN: [id number]      Request for Surgical Clearance    Procedure:   Right Percutaneous Nephrolithotomy   Date of Surgery:  Clearance TBD                                 Surgeon:  Dr. Berniece Salines Surgeon's Group or Practice Name:  [facility] Phone number:  [phone] Fax number:  [phone]   Type of Clearance Requested:   - Medical  - Pharmacy:  Hold Aspirin and Clopidogrel (Plavix) 5 days prior to surgery.   Type of Anesthesia:  Not Indicated   Additional requests/questions:    Signed, Emmit Pomfret   02/26/2023, 4:01 PM

## 2023-02-26 NOTE — Telephone Encounter (Signed)
   Pre-operative Risk Assessment    Patient Name: Leon Allen  DOB: Sep 20, 1966 MRN: [id number]      Request for Surgical Clearance    Procedure:  C4-5 Anterior cervical decompression/discectomy/fusion- Cord Signal Change and a Huge Herniated disc.  Date of Surgery:  Clearance TBD                                 Surgeon:  Dr. Coletta Memos Surgeon's Group or Practice Name:  [facility] Phone number:  [phone] Fax number:  [phone]   Type of Clearance Requested:   - Medical  - Pharmacy:  Hold Aspirin and Clopidogrel (Plavix) Not Indicated   Type of Anesthesia:  Not Indicated   Additional requests/questions:    Signed, Emmit Pomfret   02/26/2023, 1:18 PM

## 2023-02-26 NOTE — Patient Instructions (Signed)
Medication Instructions:   Your physician recommends that you continue on your current medications as directed. Please refer to the Current Medication list given to you today.   *If you need a refill on your cardiac medications before your next appointment, please call your pharmacy*   Lab Work:  None ordered.  If you have labs (blood work) drawn today and your tests are completely normal, you will receive your results only by: MyChart Message (if you have MyChart) OR A paper copy in the mail If you have any lab test that is abnormal or we need to change your treatment, we will call you to review the results.   Testing/Procedures:  None ordered.   Follow-Up: At [facility], you and your health needs are our priority.  As part of our continuing mission to provide you with exceptional heart care, we have created designated Provider Care Teams.  These Care Teams include your primary Cardiologist (physician) and Advanced Practice Providers (APPs -  Physician Assistants and Nurse Practitioners) who all work together to provide you with the care you need, when you need it.  We recommend signing up for the patient portal called "MyChart".  Sign up information is provided on this After Visit Summary.  MyChart is used to connect with patients for Virtual Visits (Telemedicine).  Patients are able to view lab/test results, encounter notes, upcoming appointments, etc.  Non-urgent messages can be sent to your provider as well.   To learn more about what you can do with MyChart, go to [email or web address].    Your next appointment:   3 month(s)  Provider:   Christell Constant, MD

## 2023-02-27 ENCOUNTER — Ambulatory Visit (HOSPITAL_COMMUNITY): Admission: RE | Admit: 2023-02-27 | Payer: Self-pay | Source: Home / Self Care | Admitting: Neurosurgery

## 2023-02-27 SURGERY — ANTERIOR CERVICAL DECOMPRESSION/DISCECTOMY FUSION 1 LEVEL
Anesthesia: General

## 2023-02-27 NOTE — Telephone Encounter (Signed)
   Primary Cardiologist: Christell Constant, MD  Chart reviewed as part of pre-operative protocol coverage.   According to the revised cardiac risk index his perioperative risk of major cardiac event is 11%.  His functional capacity and METS is 6.61 according to the Duke Activity Status Index. Given past medical history and time since last visit, based on ACC/AHA guidelines, Leon Allen would be at acceptable risk for the planned procedure without further cardiovascular testing.   Per office protocol, he may hold Plavix for 5 days prior to procedure and should resume as soon as hemodynamically stable postoperatively. Ideally aspirin should be continued without interruption, however if the bleeding risk is too great, aspirin may be held for 5-7 days prior to surgery. Please resume aspirin post operatively when it is felt to be safe from a bleeding standpoint.    Patient was advised that if he develops new symptoms prior to surgery to contact our office to arrange a follow-up appointment.  He verbalized understanding.  I will route this recommendation to the requesting party via Epic fax function and remove from pre-op pool.  Please call with questions.  Levi Aland, NP-C  02/27/2023, 7:20 AM [address] Office [phone] Fax [phone]

## 2023-03-04 ENCOUNTER — Telehealth: Payer: Self-pay | Admitting: Internal Medicine

## 2023-03-04 NOTE — Telephone Encounter (Signed)
PT  NICHLOS, CHENG   DOB Aug 25, 1966   NEEDS FOR YOU TO CALL ASAP

## 2023-03-04 NOTE — Telephone Encounter (Signed)
Called pt who reports was told by Dr. Mikal Plane office that needed emergent surgery.  If he doesn't could end up paralyzed.  Advised pt Cardiac Pre op clearance was completed on 02/27/23.   Pt should contact office performing surgery for f/u questions.  All questions answered.  Pt to go to Dr. Mikal Plane office today for f/u.

## 2023-03-05 ENCOUNTER — Other Ambulatory Visit: Payer: Self-pay | Admitting: Neurosurgery

## 2023-03-06 NOTE — Pre-Procedure Instructions (Addendum)
.Surgical Instructions    Your procedure is scheduled on Tuesday, Mar 10, 2023.   Report to [facility] Main Entrance "A" at 0245 PM then check in with the Admitting office.  Call this number if you have problems the morning of surgery:  [phone]  If you have any questions prior to your surgery date call [phone]: Open Monday-Friday 8am-4pm If you experience any cold or flu symptoms such as cough, fever, chills, shortness of breath, etc. between now and your scheduled surgery, please notify us at the above number.     Remember:   Do not eat after midnight the night before your surgery.  You may drink clear liquids until 0145 pm the day of your surgery.    Clear liquids allowed are: Water, Non-Citrus Juices (without pulp), Carbonated Beverages, Clear Tea, Black Coffee Only (NO MILK, CREAM OR POWDERED CREAMER of any kind), and Gatorade.    Take these medicines the morning of surgery with A SIP OF WATER:             isosorbide mononitrate (IMDUR)              metoprolol succinate (TOPROL-XL)              nitroGLYCERIN (NITROSTAT) as needed             pantoprazole (PROTONIX)              rosuvastatin (CRESTOR)   Hold clopidogrel (PLAVIX) for five days before surgery. Last dose should be on May 15.    Follow your surgeon's instructions on when to stop Aspirin.  If no instructions were given by your surgeon then you will need to call the office to get those instructions.    As of today, STOP taking any Aleve, Naproxen, Ibuprofen, Motrin, Advil, Goody's, BC's, all herbal medications, fish oil, and all vitamins.                     Do NOT Smoke (Tobacco/Vaping) for 24 hours prior to your procedure.  If you use a CPAP at night, you may bring your mask/headgear for your overnight stay.   Contacts, glasses, piercing's, hearing aid's, dentures or partials may not be worn into surgery, please bring cases for these belongings.    For patients admitted to the hospital, discharge  time will be determined by your treatment team.   Patients discharged the day of surgery will not be allowed to drive home, and someone needs to stay with them for 24 hours.  SURGICAL WAITING ROOM VISITATION Patients having surgery or a procedure may have no more than 2 support people in the waiting area - these visitors may rotate.   Children under the age of 96 must have an adult with them who is not the patient. If the patient needs to stay at the hospital during part of their recovery, the visitor guidelines for inpatient rooms apply. Pre-op nurse will coordinate an appropriate time for 1 support person to accompany patient in pre-op.  This support person may not rotate.   Please refer to the [facility] website for the visitor guidelines for Inpatients (after your surgery is over and you are in a regular room).     [place]- Preparing For Surgery   Oral Hygiene is also important to reduce your risk of infection.  Remember - BRUSH YOUR TEETH THE MORNING OF SURGERY WITH YOUR REGULAR TOOTHPASTE     Please read over the following fact sheets that you  were given.    If you received a COVID test during your pre-op visit  it is requested that you wear a mask when out in public, stay away from anyone that may not be feeling well and notify your surgeon if you develop symptoms. If you have been in contact with anyone that has tested positive in the last 10 days please notify you surgeon.

## 2023-03-09 ENCOUNTER — Other Ambulatory Visit: Payer: Self-pay

## 2023-03-09 ENCOUNTER — Encounter (HOSPITAL_COMMUNITY): Payer: Self-pay

## 2023-03-09 ENCOUNTER — Encounter (HOSPITAL_COMMUNITY)
Admission: RE | Admit: 2023-03-09 | Discharge: 2023-03-09 | Disposition: A | Discharge status: Home / Self Care | Payer: Medicaid Other | Source: Ambulatory Visit | Attending: Neurosurgery | Admitting: Neurosurgery

## 2023-03-09 DIAGNOSIS — Z01818 Encounter for other preprocedural examination: Secondary | ICD-10-CM | POA: Diagnosis present

## 2023-03-09 DIAGNOSIS — Z87891 Personal history of nicotine dependence: Secondary | ICD-10-CM | POA: Insufficient documentation

## 2023-03-09 DIAGNOSIS — M4802 Spinal stenosis, cervical region: Secondary | ICD-10-CM | POA: Insufficient documentation

## 2023-03-09 DIAGNOSIS — Z87440 Personal history of urinary (tract) infections: Secondary | ICD-10-CM | POA: Insufficient documentation

## 2023-03-09 DIAGNOSIS — R319 Hematuria, unspecified: Secondary | ICD-10-CM | POA: Insufficient documentation

## 2023-03-09 DIAGNOSIS — M50221 Other cervical disc displacement at C4-C5 level: Secondary | ICD-10-CM | POA: Diagnosis not present

## 2023-03-09 DIAGNOSIS — I2510 Atherosclerotic heart disease of native coronary artery without angina pectoris: Secondary | ICD-10-CM | POA: Insufficient documentation

## 2023-03-09 DIAGNOSIS — Z8673 Personal history of transient ischemic attack (TIA), and cerebral infarction without residual deficits: Secondary | ICD-10-CM | POA: Diagnosis not present

## 2023-03-09 DIAGNOSIS — N12 Tubulo-interstitial nephritis, not specified as acute or chronic: Secondary | ICD-10-CM | POA: Diagnosis not present

## 2023-03-09 LAB — CBC
HCT: 42 % (ref 39.0–52.0)
Hemoglobin: 14.1 g/dL (ref 13.0–17.0)
MCH: 30.5 pg (ref 26.0–34.0)
MCHC: 33.6 g/dL (ref 30.0–36.0)
MCV: 90.9 fL (ref 80.0–100.0)
Platelets: 219 10*3/uL (ref 150–400)
RBC: 4.62 MIL/uL (ref 4.22–5.81)
RDW: 12.9 % (ref 11.5–15.5)
WBC: 7 10*3/uL (ref 4.0–10.5)
nRBC: 0 % (ref 0.0–0.2)

## 2023-03-09 LAB — COMPREHENSIVE METABOLIC PANEL
ALT: 26 U/L (ref 0–44)
AST: 22 U/L (ref 15–41)
Albumin: 3.5 g/dL (ref 3.5–5.0)
Alkaline Phosphatase: 58 U/L (ref 38–126)
Anion gap: 9 (ref 5–15)
BUN: 24 mg/dL — ABNORMAL HIGH (ref 6–20)
CO2: 25 mmol/L (ref 22–32)
Calcium: 9.3 mg/dL (ref 8.9–10.3)
Chloride: 103 mmol/L (ref 98–111)
Creatinine, Ser: 1.05 mg/dL (ref 0.61–1.24)
GFR, Estimated: >60 mL/min (ref >60)
Glucose, Bld: 109 mg/dL — ABNORMAL HIGH (ref 70–99)
Potassium: 4 mmol/L (ref 3.5–5.1)
Sodium: 137 mmol/L (ref 135–145)
Total Bilirubin: 0.3 mg/dL (ref 0.3–1.2)
Total Protein: 6.8 g/dL (ref 6.5–8.1)

## 2023-03-09 LAB — SURGICAL PCR SCREEN
MRSA, PCR: NEGATIVE
Staphylococcus aureus: NEGATIVE

## 2023-03-09 NOTE — Progress Notes (Addendum)
PCP - No PCP Cardiologist - Dr. Izora Ribas clearance in EPIC  PPM/ICD - Denies  Chest x-ray - N/A  EKG - 02/26/23 Stress Test - 01/18/16 ECHO - 01/18/16 Cardiac Cath - 08/13/22  Sleep Study - No   DM - Denies  Blood Thinner Instructions: Per patient last dose of Plavix was 5-6 days ago  Aspirin Instructions: Per patient last dose of ASpirin 5-6 days ago   COVID TEST- N/A   Anesthesia review: Yes cardiac history  Patient denies shortness of breath, fever, cough and chest pain at PAT appointment   All instructions explained to the patient, with a verbal understanding of the material. Patient agrees to go over the instructions while at home for a better understanding.  The opportunity to ask questions was provided.  Updated patients arrival time to 1500

## 2023-03-09 NOTE — Anesthesia Preprocedure Evaluation (Signed)
Anesthesia Evaluation  Patient identified by MRN, date of birth, ID band Patient awake    Reviewed: Allergy & Precautions, H&P , NPO status , Patient's Chart, lab work & pertinent test results, reviewed documented beta blocker date and time   Airway Mallampati: II  TM Distance: >3 FB Neck ROM: Full    Dental no notable dental hx. (+) Teeth Intact, Dental Advisory Given   Pulmonary former smoker   Pulmonary exam normal breath sounds clear to auscultation       Cardiovascular hypertension, On Medications and On Home Beta Blockers + CAD, + Past MI and + Cardiac Stents   Rhythm:Regular Rate:Normal     Neuro/Psych  Headaches   Depression       GI/Hepatic Neg liver ROS,GERD  Medicated,,  Endo/Other  negative endocrine ROS    Renal/GU negative Renal ROS  negative genitourinary   Musculoskeletal   Abdominal   Peds  Hematology negative hematology ROS (+)   Anesthesia Other Findings   Reproductive/Obstetrics negative OB ROS                             Anesthesia Physical Anesthesia Plan  ASA: 3  Anesthesia Plan: General   Post-op Pain Management: Tylenol PO (pre-op)*   Induction: Intravenous  PONV Risk Score and Plan: 3 and Ondansetron, Dexamethasone and Midazolam  Airway Management Planned: Oral ETT  Additional Equipment: ClearSight  Intra-op Plan:   Post-operative Plan: Extubation in OR  Informed Consent: I have reviewed the patients History and Physical, chart, labs and discussed the procedure including the risks, benefits and alternatives for the proposed anesthesia with the patient or authorized representative who has indicated his/her understanding and acceptance.     Dental advisory given  Plan Discussed with: CRNA  Anesthesia Plan Comments: (PAT note written 03/09/2023 by Shonna Chock, PA-C.  )       Anesthesia Quick Evaluation

## 2023-03-09 NOTE — Progress Notes (Signed)
Anesthesia Chart Review:  Case: [id number] Date/Time: 03/10/23 1645   Procedure: C4-5 ACDF - RM 21 3C   Anesthesia type: General   Pre-op diagnosis: Other cervical disc displacement, cervical region   [facility] ROOM 21 / [facility]   Surgeons: Coletta Memos, MD       DISCUSSION: Patient is a 57 year old male scheduled for the above procedure.   History includes former smoker (quit 10/21/15), CAD (anteroapical/apical inferior MI, s/p DES LAD 05/05/04; DES LAD for ISR 08/13/22), CVA (2015), hematuria (history of UTI, pyelonephritis, nephrolithiasis). Reported "overdosing" at the dentist office after receiving anesthesia or sedation for a procedure. + Alcohol and marijuana occasional use.   Leon Allen has a large right paracentral disc protrusion at C4-C5 resulting in flattening of the right ventral cord with associated abnormal cord signal, compatible with compressive myelopathy.  Moderate-severe right and mild-to-moderate left neural foraminal stenosis at this level. C4-5 ACDF recommended. He is now > 6 months ffrom DES. Notes suggest surgery is time sensitive due to myelopathic symptoms. Dr. Franky Macho did reach out to cardiology for input. Per 02/27/23 notation by Eligha Bridegroom, NP, "According to the revised cardiac risk index his perioperative risk of major cardiac event is 11%.  His functional capacity and METS is 6.61 according to the [facility] Activity Status Index. Given past medical history and time since last visit, based on ACC/AHA guidelines, Leon Allen would be at acceptable risk for the planned procedure without further cardiovascular testing.    Per office protocol, he may hold Plavix for 5 days prior to procedure and should resume as soon as hemodynamically stable postoperatively. Ideally aspirin should be continued without interruption, however if the bleeding risk is too great, aspirin may be held for 5-7 days prior to surgery. Please resume aspirin post operatively when it is felt  to be safe from a bleeding standpoint." On 03/09/23, he reported last Plavix and ASA ~ 5-6 days ago.   Anesthesia team to evaluate on the day of surgery.   VS: BP (!) 128/98   Pulse 64   Temp 36.8 C (Oral)   Resp 18   Ht 5\' 10"  (1.778 m)   Wt 77.7 kg   SpO2 100%   BMI 24.56 kg/m    PROVIDERS: Pcp, No Riley Lam, MD is cardiologist  - Followed by [facility]. (CT Renal Study ordered by Berniece Salines, MD)    LABS:  Lab Results  Component Value Date   WBC 7.0 03/09/2023   HGB 14.1 03/09/2023   HCT 42.0 03/09/2023   PLT 219 03/09/2023   GLUCOSE 109 (H) 03/09/2023   ALT 26 03/09/2023   AST 22 03/09/2023   NA 137 03/09/2023   K 4.0 03/09/2023   CL 103 03/09/2023   CREATININE 1.05 03/09/2023   BUN 24 (H) 03/09/2023   CO2 25 03/09/2023   HGBA1C 5.6 08/21/2022     IMAGES: MRI C-spine 02/19/23: IMPRESSION: Large right paracentral disc protrusion at C4-C5 resulting in flattening of the right ventral cord with associated abnormal cord signal, compatible with compressive myelopathy. Moderate-severe right and mild-to-moderate left neural foraminal stenosis at this level.   Moderate-severe right and mild-to-moderate left neural foraminal stenosis at C3-C4.   Moderate-severe bilateral neural foraminal stenosis at C5-C6.   Moderate-severe right and moderate left neural foraminal stenosis at C6-C7.   EKG: 02/26/23: NSR   CV: LHC/PCI 08/13/22:   Mid LAD lesion is 90% stenosed.   Prox RCA lesion is 30% stenosed.  Prox Cx to Mid Cx lesion is 30% stenosed.   A drug-eluting stent was successfully placed using a SYNERGY XD 3.50X20.   Post intervention, there is a 0% residual stenosis.   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  Successful PTCA/DES x 1 mid LAD covering the entire old stented segment Mild non-obstructive disease in the proximal RCA and mid Circumflex Mild hypokinesis of the anterior wall and apex with overall preserved LV  systolic function   Recommendations: DAPT with ASA and Plavix for at least six months but longer if tolerated. Will continue statin and beta blocker.    Echo 01/18/16: Study Conclusions  - Left ventricle: The cavity size was normal. Systolic function was    normal. The estimated ejection fraction was in the range of 55%    to 60%. Wall motion was normal; there were no regional wall    motion abnormalities. Left ventricular diastolic function    parameters were normal.  - Aortic valve: Trileaflet; normal thickness leaflets. There was no    regurgitation.  - Aortic root: The aortic root was normal in size.  - Mitral valve: There was no regurgitation.  - Left atrium: The atrium was normal in size.  - Right ventricle: The cavity size was normal. Wall thickness was    normal. Systolic function was normal.  - Right atrium: The atrium was normal in size.  - Tricuspid valve: There was trivial regurgitation.  - Pulmonic valve: There was no regurgitation.  - Pulmonary arteries: The main pulmonary artery was normal-sized.    Systolic pressure was within the normal range.  - Inferior vena cava: The vessel was normal in size.  - Pericardium, extracardiac: There was no pericardial effusion.    US Carotid 01/18/16: Summary:  Bilateral - No obvious evidence of ICA stenosis. Vertebral artery  flow is antegrade.   Past Medical History:  Diagnosis Date   Adrenal adenoma    bilateral   Complication of anesthesia    "@ dentist office; was overdosed"   Coronary artery disease    CVA (cerebral vascular accident) (HCC) 2015   denies residual on 01/17/2016   Daily headache    "lately" (01/17/2016)   Gonorrhea 2015   Heart attack (HCC) 2005   Hematuria 12/2015   Kidney stone    Situational depression    "old girlfriend missing"    Past Surgical History:  Procedure Laterality Date   APPENDECTOMY     CORONARY ANGIOPLASTY WITH STENT PLACEMENT  2005   CORONARY STENT INTERVENTION N/A 08/13/2022    Procedure: CORONARY STENT INTERVENTION;  Surgeon: Kathleene Hazel, MD;  Location: [facility] INVASIVE CV LAB;  Service: Cardiovascular;  Laterality: N/A;   INGUINAL HERNIA REPAIR Right    LEFT HEART CATH AND CORONARY ANGIOGRAPHY N/A 08/13/2022   Procedure: LEFT HEART CATH AND CORONARY ANGIOGRAPHY;  Surgeon: Kathleene Hazel, MD;  Location: [facility]VAS[facility] CV LAB;  Service: Cardiovascular;  Laterality: N/A;   LITHOTRIPSY  X 2    MEDICATIONS:  aspirin EC 81 MG tablet   clopidogrel (PLAVIX) 75 MG tablet   isosorbide mononitrate (IMDUR) 30 MG 24 hr tablet   metoprolol succinate (TOPROL-XL) 25 MG 24 hr tablet   nitroGLYCERIN (NITROSTAT) 0.4 MG SL tablet   pantoprazole (PROTONIX) 20 MG tablet   rosuvastatin (CRESTOR) 5 MG tablet   No current facility-administered medications for this encounter.    Shonna Chock, PA-C Surgical Short Stay/Anesthesiology [facility] Phone [phone] [facility] Phone [phone] 03/09/2023 3:47 PM

## 2023-03-10 ENCOUNTER — Ambulatory Visit (HOSPITAL_COMMUNITY): Payer: Medicaid Other | Admitting: Vascular Surgery

## 2023-03-10 ENCOUNTER — Ambulatory Visit (HOSPITAL_COMMUNITY): Payer: Medicaid Other

## 2023-03-10 ENCOUNTER — Encounter (HOSPITAL_COMMUNITY)
Admission: RE | Disposition: A | Discharge status: Home / Self Care | Payer: Self-pay | Source: Home / Self Care | Attending: Neurosurgery

## 2023-03-10 ENCOUNTER — Ambulatory Visit (HOSPITAL_COMMUNITY)
Admission: RE | Admit: 2023-03-10 | Discharge: 2023-03-11 | Disposition: A | Discharge status: Home / Self Care | Payer: Medicaid Other | Attending: Neurosurgery | Admitting: Neurosurgery

## 2023-03-10 ENCOUNTER — Ambulatory Visit (HOSPITAL_BASED_OUTPATIENT_CLINIC_OR_DEPARTMENT_OTHER): Payer: Medicaid Other

## 2023-03-10 ENCOUNTER — Other Ambulatory Visit: Payer: Self-pay

## 2023-03-10 ENCOUNTER — Encounter (HOSPITAL_COMMUNITY): Payer: Self-pay | Admitting: Neurosurgery

## 2023-03-10 DIAGNOSIS — M50221 Other cervical disc displacement at C4-C5 level: Secondary | ICD-10-CM

## 2023-03-10 DIAGNOSIS — I10 Essential (primary) hypertension: Secondary | ICD-10-CM

## 2023-03-10 DIAGNOSIS — I252 Old myocardial infarction: Secondary | ICD-10-CM

## 2023-03-10 DIAGNOSIS — I2510 Atherosclerotic heart disease of native coronary artery without angina pectoris: Secondary | ICD-10-CM | POA: Diagnosis not present

## 2023-03-10 DIAGNOSIS — M5020 Other cervical disc displacement, unspecified cervical region: Secondary | ICD-10-CM | POA: Diagnosis present

## 2023-03-10 DIAGNOSIS — Z87891 Personal history of nicotine dependence: Secondary | ICD-10-CM

## 2023-03-10 DIAGNOSIS — Z955 Presence of coronary angioplasty implant and graft: Secondary | ICD-10-CM | POA: Diagnosis not present

## 2023-03-10 DIAGNOSIS — Z01818 Encounter for other preprocedural examination: Secondary | ICD-10-CM

## 2023-03-10 DIAGNOSIS — M5022 Other cervical disc displacement, mid-cervical region, unspecified level: Secondary | ICD-10-CM | POA: Diagnosis not present

## 2023-03-10 HISTORY — PX: ANTERIOR CERVICAL DECOMP/DISCECTOMY FUSION: SHX1161

## 2023-03-10 SURGERY — ANTERIOR CERVICAL DECOMPRESSION/DISCECTOMY FUSION 1 LEVEL
Anesthesia: General | Site: Spine Cervical

## 2023-03-10 MED ORDER — ACETAMINOPHEN 500 MG PO TABS
1000.0000 mg | ORAL_TABLET | Freq: Four times a day (QID) | ORAL | Status: DC
  Administered 2023-03-10 – 2023-03-11 (×2): 1000 mg via ORAL
  Filled 2023-03-10 (×2): qty 2

## 2023-03-10 MED ORDER — MENTHOL 3 MG MT LOZG: 1.0000 | LOZENGE | OROMUCOSAL | Status: DC | PRN

## 2023-03-10 MED ORDER — ASPIRIN 81 MG PO TBEC: 81.0000 mg | DELAYED_RELEASE_TABLET | Freq: Every day | ORAL | Status: DC

## 2023-03-10 MED ORDER — MIDAZOLAM HCL 2 MG/2ML IJ SOLN
INTRAMUSCULAR | Status: DC | PRN
  Administered 2023-03-10: 2 mg via INTRAVENOUS

## 2023-03-10 MED ORDER — DIAZEPAM 5 MG PO TABS: 5.0000 mg | ORAL_TABLET | Freq: Four times a day (QID) | ORAL | Status: DC | PRN

## 2023-03-10 MED ORDER — OXYCODONE HCL 5 MG PO TABS: 10.0000 mg | ORAL_TABLET | ORAL | Status: DC | PRN

## 2023-03-10 MED ORDER — FENTANYL CITRATE (PF) 250 MCG/5ML IJ SOLN
INTRAMUSCULAR | Status: AC
  Filled 2023-03-10: qty 5

## 2023-03-10 MED ORDER — LACTATED RINGERS IV SOLN: INTRAVENOUS | Status: DC

## 2023-03-10 MED ORDER — PROPOFOL 10 MG/ML IV BOLUS
INTRAVENOUS | Status: DC | PRN
  Administered 2023-03-10: 140 mg via INTRAVENOUS

## 2023-03-10 MED ORDER — DEXAMETHASONE SODIUM PHOSPHATE 10 MG/ML IJ SOLN
INTRAMUSCULAR | Status: DC | PRN
  Administered 2023-03-10: 10 mg via INTRAVENOUS

## 2023-03-10 MED ORDER — OXYCODONE HCL 5 MG PO TABS: 5.0000 mg | ORAL_TABLET | ORAL | Status: DC | PRN

## 2023-03-10 MED ORDER — DEXAMETHASONE SODIUM PHOSPHATE 10 MG/ML IJ SOLN
INTRAMUSCULAR | Status: AC
  Filled 2023-03-10: qty 2

## 2023-03-10 MED ORDER — CEFAZOLIN SODIUM 1 G IJ SOLR
INTRAMUSCULAR | Status: AC
  Filled 2023-03-10: qty 20

## 2023-03-10 MED ORDER — SODIUM CHLORIDE 0.9% FLUSH: 3.0000 mL | INTRAVENOUS | Status: DC | PRN

## 2023-03-10 MED ORDER — ONDANSETRON HCL 4 MG PO TABS: 4.0000 mg | ORAL_TABLET | Freq: Four times a day (QID) | ORAL | Status: DC | PRN

## 2023-03-10 MED ORDER — SODIUM CHLORIDE 0.9 % IV SOLN: 250.0000 mL | INTRAVENOUS | Status: DC

## 2023-03-10 MED ORDER — ACETAMINOPHEN 325 MG PO TABS: 325.0000 mg | ORAL_TABLET | Freq: Once | ORAL | Status: DC | PRN

## 2023-03-10 MED ORDER — HYDROMORPHONE HCL 1 MG/ML IJ SOLN: 0.2500 mg | INTRAMUSCULAR | Status: DC | PRN

## 2023-03-10 MED ORDER — ROCURONIUM BROMIDE 10 MG/ML (PF) SYRINGE
PREFILLED_SYRINGE | INTRAVENOUS | Status: AC
  Filled 2023-03-10: qty 20

## 2023-03-10 MED ORDER — FENTANYL CITRATE (PF) 250 MCG/5ML IJ SOLN
INTRAMUSCULAR | Status: DC | PRN
  Administered 2023-03-10: 100 ug via INTRAVENOUS
  Administered 2023-03-10: 50 ug via INTRAVENOUS
  Administered 2023-03-10 (×2): 100 ug via INTRAVENOUS
  Administered 2023-03-10: 50 ug via INTRAVENOUS

## 2023-03-10 MED ORDER — HYDROMORPHONE HCL 1 MG/ML IJ SOLN
INTRAMUSCULAR | Status: AC
  Filled 2023-03-10: qty 1

## 2023-03-10 MED ORDER — MIDAZOLAM HCL 2 MG/2ML IJ SOLN
INTRAMUSCULAR | Status: AC
  Filled 2023-03-10: qty 2

## 2023-03-10 MED ORDER — LIDOCAINE 2% (20 MG/ML) 5 ML SYRINGE
INTRAMUSCULAR | Status: AC
  Filled 2023-03-10: qty 5

## 2023-03-10 MED ORDER — ACETAMINOPHEN 160 MG/5ML PO SOLN: 325.0000 mg | Freq: Once | ORAL | Status: DC | PRN

## 2023-03-10 MED ORDER — LIDOCAINE 2% (20 MG/ML) 5 ML SYRINGE
INTRAMUSCULAR | Status: DC | PRN
  Administered 2023-03-10: 40 mg via INTRAVENOUS

## 2023-03-10 MED ORDER — LIDOCAINE-EPINEPHRINE 0.5 %-1:200000 IJ SOLN
INTRAMUSCULAR | Status: DC | PRN
  Administered 2023-03-10: 10 mL

## 2023-03-10 MED ORDER — CEFAZOLIN SODIUM-DEXTROSE 2-3 GM-%(50ML) IV SOLR
INTRAVENOUS | Status: DC | PRN
  Administered 2023-03-10: 2 g via INTRAVENOUS

## 2023-03-10 MED ORDER — NITROGLYCERIN 0.4 MG SL SUBL: 0.4000 mg | SUBLINGUAL_TABLET | SUBLINGUAL | Status: DC | PRN

## 2023-03-10 MED ORDER — ACETAMINOPHEN 650 MG RE SUPP: 650.0000 mg | RECTAL | Status: DC | PRN

## 2023-03-10 MED ORDER — MORPHINE SULFATE (PF) 2 MG/ML IV SOLN: 2.0000 mg | INTRAVENOUS | Status: DC | PRN

## 2023-03-10 MED ORDER — POTASSIUM CHLORIDE IN NACL 20-0.9 MEQ/L-% IV SOLN: INTRAVENOUS | Status: DC

## 2023-03-10 MED ORDER — ONDANSETRON HCL 4 MG/2ML IJ SOLN: 4.0000 mg | Freq: Four times a day (QID) | INTRAMUSCULAR | Status: DC | PRN

## 2023-03-10 MED ORDER — ONDANSETRON HCL 4 MG/2ML IJ SOLN
INTRAMUSCULAR | Status: DC | PRN
  Administered 2023-03-10: 4 mg via INTRAVENOUS

## 2023-03-10 MED ORDER — ROCURONIUM BROMIDE 10 MG/ML (PF) SYRINGE
PREFILLED_SYRINGE | INTRAVENOUS | Status: DC | PRN
  Administered 2023-03-10: 20 mg via INTRAVENOUS
  Administered 2023-03-10: 90 mg via INTRAVENOUS

## 2023-03-10 MED ORDER — ORAL CARE MOUTH RINSE: 15.0000 mL | Freq: Once | OROMUCOSAL | Status: AC

## 2023-03-10 MED ORDER — AMISULPRIDE (ANTIEMETIC) 5 MG/2ML IV SOLN
INTRAVENOUS | Status: AC
  Filled 2023-03-10: qty 4

## 2023-03-10 MED ORDER — 0.9 % SODIUM CHLORIDE (POUR BTL) OPTIME
TOPICAL | Status: DC | PRN
  Administered 2023-03-10: 1000 mL

## 2023-03-10 MED ORDER — THROMBIN 20000 UNITS EX SOLR: CUTANEOUS | Status: DC | PRN

## 2023-03-10 MED ORDER — MEPERIDINE HCL 25 MG/ML IJ SOLN: 6.2500 mg | INTRAMUSCULAR | Status: DC | PRN

## 2023-03-10 MED ORDER — THROMBIN 20000 UNITS EX SOLR
CUTANEOUS | Status: AC
  Filled 2023-03-10: qty 20000

## 2023-03-10 MED ORDER — LIDOCAINE-EPINEPHRINE 0.5 %-1:200000 IJ SOLN
INTRAMUSCULAR | Status: AC
  Filled 2023-03-10: qty 50

## 2023-03-10 MED ORDER — ACETAMINOPHEN 10 MG/ML IV SOLN: 1000.0000 mg | Freq: Once | INTRAVENOUS | Status: DC | PRN

## 2023-03-10 MED ORDER — ACETAMINOPHEN 325 MG PO TABS: 650.0000 mg | ORAL_TABLET | ORAL | Status: DC | PRN

## 2023-03-10 MED ORDER — ACETAMINOPHEN 500 MG PO TABS
1000.0000 mg | ORAL_TABLET | Freq: Once | ORAL | Status: AC
  Administered 2023-03-10: 1000 mg via ORAL
  Filled 2023-03-10: qty 2

## 2023-03-10 MED ORDER — CHLORHEXIDINE GLUCONATE 0.12 % MT SOLN
15.0000 mL | Freq: Once | OROMUCOSAL | Status: AC
  Administered 2023-03-10: 15 mL via OROMUCOSAL
  Filled 2023-03-10: qty 15

## 2023-03-10 MED ORDER — ONDANSETRON HCL 4 MG/2ML IJ SOLN
INTRAMUSCULAR | Status: AC
  Filled 2023-03-10: qty 4

## 2023-03-10 MED ORDER — HYDROMORPHONE HCL 1 MG/ML IJ SOLN
0.2500 mg | INTRAMUSCULAR | Status: DC | PRN
  Administered 2023-03-10: 0.5 mg via INTRAVENOUS

## 2023-03-10 MED ORDER — SUGAMMADEX SODIUM 200 MG/2ML IV SOLN
INTRAVENOUS | Status: DC | PRN
  Administered 2023-03-10: 300 mg via INTRAVENOUS

## 2023-03-10 MED ORDER — PROMETHAZINE HCL 25 MG/ML IJ SOLN
6.2500 mg | INTRAMUSCULAR | Status: DC | PRN
  Administered 2023-03-10: 6.25 mg via INTRAVENOUS

## 2023-03-10 MED ORDER — AMISULPRIDE (ANTIEMETIC) 5 MG/2ML IV SOLN
10.0000 mg | Freq: Once | INTRAVENOUS | Status: AC | PRN
  Administered 2023-03-10: 10 mg via INTRAVENOUS

## 2023-03-10 MED ORDER — LACTATED RINGERS IV SOLN: INTRAVENOUS | Status: DC | PRN

## 2023-03-10 MED ORDER — SODIUM CHLORIDE 0.9% FLUSH: 3.0000 mL | Freq: Two times a day (BID) | INTRAVENOUS | Status: DC

## 2023-03-10 MED ORDER — PHENOL 1.4 % MT LIQD
1.0000 | OROMUCOSAL | Status: DC | PRN
  Administered 2023-03-11: 1 via OROMUCOSAL
  Filled 2023-03-10: qty 177

## 2023-03-10 MED ORDER — PROMETHAZINE HCL 25 MG/ML IJ SOLN
INTRAMUSCULAR | Status: AC
  Filled 2023-03-10: qty 1

## 2023-03-10 SURGICAL SUPPLY — 44 items
ADH SKN CLS APL DERMABOND .7 (GAUZE/BANDAGES/DRESSINGS) ×1
BAG COUNTER SPONGE SURGICOUNT (BAG) ×1 IMPLANT
BAG SPNG CNTER NS LX DISP (BAG) ×1
BLADE CLIPPER SURG (BLADE) IMPLANT
BONE SPACER C-PLUG 14X8X12 (Bone Implant) IMPLANT
BUR DRUM 4.0 (BURR) ×1 IMPLANT
BUR MATCHSTICK NEURO 3.0 LAGG (BURR) ×1 IMPLANT
CANISTER SUCT 3000ML PPV (MISCELLANEOUS) ×1 IMPLANT
DERMABOND ADVANCED .7 DNX12 (GAUZE/BANDAGES/DRESSINGS) ×1 IMPLANT
DRAPE HALF SHEET 40X57 (DRAPES) IMPLANT
DRAPE LAPAROTOMY 100X72 PEDS (DRAPES) ×1 IMPLANT
DRAPE MICROSCOPE SLANT 54X150 (MISCELLANEOUS) ×1 IMPLANT
DURAPREP 6ML APPLICATOR 50/CS (WOUND CARE) ×1 IMPLANT
ELECT COATED BLADE 2.86 ST (ELECTRODE) ×1 IMPLANT
ELECT REM PT RETURN 9FT ADLT (ELECTROSURGICAL) ×1
ELECTRODE REM PT RTRN 9FT ADLT (ELECTROSURGICAL) ×1 IMPLANT
GAUZE 4X4 16PLY ~~LOC~~+RFID DBL (SPONGE) IMPLANT
GLOVE ECLIPSE 6.5 STRL STRAW (GLOVE) ×1 IMPLANT
GLOVE EXAM NITRILE XL STR (GLOVE) IMPLANT
GOWN STRL REUS W/ TWL LRG LVL3 (GOWN DISPOSABLE) ×2 IMPLANT
GOWN STRL REUS W/ TWL XL LVL3 (GOWN DISPOSABLE) IMPLANT
GOWN STRL REUS W/TWL 2XL LVL3 (GOWN DISPOSABLE) IMPLANT
GOWN STRL REUS W/TWL LRG LVL3 (GOWN DISPOSABLE) ×2
GOWN STRL REUS W/TWL XL LVL3 (GOWN DISPOSABLE)
KIT BASIN OR (CUSTOM PROCEDURE TRAY) ×1 IMPLANT
KIT TURNOVER KIT B (KITS) ×1 IMPLANT
NDL HYPO 25X1 1.5 SAFETY (NEEDLE) ×1 IMPLANT
NDL SPNL 22GX3.5 QUINCKE BK (NEEDLE) ×1 IMPLANT
NEEDLE HYPO 25X1 1.5 SAFETY (NEEDLE) ×1 IMPLANT
NEEDLE SPNL 22GX3.5 QUINCKE BK (NEEDLE) ×1 IMPLANT
NS IRRIG 1000ML POUR BTL (IV SOLUTION) ×1 IMPLANT
PACK LAMINECTOMY NEURO (CUSTOM PROCEDURE TRAY) ×1 IMPLANT
PAD ARMBOARD 7.5X6 YLW CONV (MISCELLANEOUS) ×3 IMPLANT
PLATE ACP 1-LEVEL1.6V22 (Plate) IMPLANT
SCREW ACP VA ST 3.5X15 (Screw) IMPLANT
SOL ELECTROSURG ANTI STICK (MISCELLANEOUS) ×1
SOLUTION ELECTROSURG ANTI STCK (MISCELLANEOUS) ×1 IMPLANT
SPIKE FLUID TRANSFER (MISCELLANEOUS) ×1 IMPLANT
SPONGE INTESTINAL PEANUT (DISPOSABLE) ×1 IMPLANT
SPONGE SURGIFOAM ABS GEL SZ50 (HEMOSTASIS) ×1 IMPLANT
SUT VIC AB 3-0 SH 8-18 (SUTURE) ×1 IMPLANT
TOWEL GREEN STERILE (TOWEL DISPOSABLE) ×1 IMPLANT
TOWEL GREEN STERILE FF (TOWEL DISPOSABLE) ×1 IMPLANT
WATER STERILE IRR 1000ML POUR (IV SOLUTION) ×1 IMPLANT

## 2023-03-10 NOTE — H&P (Signed)
BP (!) 125/93   Pulse 68   Temp 98 F (36.7 C) (Oral)   Resp 18   Ht 5\' 10"  (1.778 m)   Wt 78 kg   SpO2 96%   BMI 24.68 kg/m  Leon Allen presents to me today after seeing his cardiologist on 12/08/2022.  At that time, he describes having numbness in both upper extremities and it was felt by the [facility] that a referral to a neurosurgeon was warranted.  His past medical history consists of an adrenal adenoma, coronary artery disease, cerebrovascular accident, daily headache, gonorrhea, heart attack, hematuria, kidney stones, depression.  He has a history of polysubstance abuse and admits to smoking marijuana currently. He snorted cocaine when he was younger.  He has undergone an appendectomy.  He has had a coronary stent placed and revised in 2023, inguinal herniorrhaphy, left heart catheterization and coronary angiography in October 2023.  He has an intolerance to Atorvastatin, it causes chest pressure.  Bee venom causes swelling.  Penicillin, by his account, caused an immediate rash.  He has coronary artery disease, hypertension, dyslipidemia.  In 2005, he had undergone a stent to the midportion of his left anterior descending coronary artery.  He never showed up again until he reported chest pain in March 2017.  He had an echo at that time, which was normal.  He had a stress test, which was also normal.  He had chest pain again in September 2023.  He has no insurance, so cost has been an inhibitor to his receiving pain medication.  But, during all of these visits for his heart, he mentioned other symptoms, and those other symptoms were tingling in the arms and numbness on the inside of his arm.  He said that had been occurring approximately 3-4 weeks.  He was a big smoker of cigarettes. Again, he is just smoking marijuana, by his account, at this point.  He says he has heard grinding in his neck when he turns his head.  He wanted to take an extra isosorbide mononitrate to help with his neck.   He was instructed by the heart team that that was unlikely to have any benefit.  He reported that he was going to try Cialis because it was causing him a problem, not being able to take it, and the cardiologist wanted him to stop the Mononitrate before he started doing Cialis.  He said he understood the dangers and wanted to remain on it.  He takes Aspirin, Plavix, metoprolol, Nitroglycerin, Omega-3, Pantoprazole, Rosuvastatin.  He has a history of breast cancer in his sister, heart attack.  Mother is deceased.  Father is deceased.  Sister is alive.  He is divorced.  He has 2 children.  Stopped smoking in January 2017.  He says he drinks a beer or mixed drink a few times a year.  He is at home by himself.     On examination today, he is alert, oriented x4.  He answers all questions appropriately.  Memory, language, attention span, and fund of knowledge are normal.  He is hyperreflexic, upper and lower extremities. Crossed adductor response.  He has clonus in his right foot.  He has a Hoffmann sign bilaterally.  Strength is full. Balance is poor.  Romberg is positive. For swaying he did not fall. Coordination is poor.  Muscle tone and bulk are normal.  He weighs 187 pounds.  Vitals: Pain is 1/10.  Height 5 feet 10 inches. Temperature is 97.8, blood pressure 118/79,  pulse 57. Leon Allen returns today with the MRI of the cervical spine.  He has a humongous disc herniation at C4-5.  This is the reason for the shoulder pain.  This is the reason for the numbness.  This is the reason for all of his complaints.  Leon Allen needs to have this operation done.  He already has cord signal behind the C4-5 disc herniation.  We'll try to get this done this week.  He does not have insurance.  This is going to be out of his pocket, so the good part, we do not have to wait for approval, the bad part, we have to unfortunately cover this.  But we will get this done.  Risks and benefits were explained.  He was given a detailed  instruction sheet with regard to the operation. That has been dictated by me.  The biggest risks are weakness in the upper extremities, spinal cord damage, no relief of pain, fusion failure, hardware failure, weakness in 1 or both forearms, weakness in 1 or both legs, possible urinary and/or bowel incontinence.  He understands and wishes to proceed.

## 2023-03-10 NOTE — Transfer of Care (Signed)
Immediate Anesthesia Transfer of Care Note  Patient: Leon Allen  Procedure(s) Performed: CERVICAL FOUR-FIVE ANTERIOR CERVICAL DECOMPRESSION/DISCECTOMY FUSION (Spine Cervical)  Patient Location: PACU  Anesthesia Type:General  Level of Consciousness: awake, alert , and oriented  Airway & Oxygen Therapy: Patient Spontanous Breathing and Patient connected to nasal cannula oxygen  Post-op Assessment: Report given to RN, Post -op Vital signs reviewed and stable, and Patient moving all extremities  Post vital signs: Reviewed and stable  Last Vitals:  Vitals Value Taken Time  BP 160/95 03/10/23 2228  Temp    Pulse 92 03/10/23 2229  Resp 12 03/10/23 2229  SpO2 93 % 03/10/23 2229  Vitals shown include unvalidated device data.  Last Pain:  Vitals:   03/10/23 1534  TempSrc:   PainSc: 5       Patients Stated Pain Goal: 2 (03/10/23 1534)  Complications: No notable events documented.

## 2023-03-10 NOTE — Op Note (Signed)
03/10/2023  10:31 PM  PATIENT:  Leon Allen  57 y.o. 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 Leon Allen was taken to the operating room, intubated, and placed under general anesthesia without difficulty. He was positioned supine with his head in slight extension on a horseshoe headrest. The neck was prepped and draped in a sterile manner. I infiltrated 6 cc's 1/2%lidocaine/1:200,000 strength epinephrine into the planned incision starting from the midline to the medial border of the left sternocleidomastoid muscle. I opened the incision with a 10 blade and dissected sharply through soft tissue to the platysma. I dissected in the plane superior to the platysma both rostrally and caudally. I then opened the platysma in a horizontal fashion with Metzenbaum scissors, and dissected in the inferior plane rostrally and caudally. With both blunt and sharp technique I created an avascular corridor to the cervical spine. I placed a spinal needle(s) in the disc space at C4/5 . I then reflected the longus colli from C4 to C5 and placed self retaining retractors. I opened the disc space(s) at C4/5 with a 15 blade. I removed disc with curettes, Kerrison punches, and the drill. Using the drill I removed osteophytes and prepared for the decompression.  I decompressed the spinal canal and the C5 root(s) with the drill, Kerrison punches, and the curettes. I used the microscope to  aid in microdissection. I removed the posterior longitudinal ligament to fully expose and decompress the thecal sac. I exposed the roots laterally taking down the 4/5 uncovertebral joints. With the decompression complete I moved on to the arthrodesis. I used the drill to level the surfaces of C4,5. I removed soft tissue to prepare the disc space and the bony surfaces. I measured the space and placed an 8mm structural allograft into the disc space.  I then placed the anterior instrumentation. I placed 2 screws in each vertebral body through the plate. I locked the screws into place. Intraoperative xray showed the graft, plate, and screws to be in good position. I irrigated the wound, achieved hemostasis, and closed the wound in layers. I approximated the platysma, and the subcuticular plane with vicryl sutures. I used Dermabond for a sterile dressing.   PLAN OF CARE: Admit for overnight observation  PATIENT DISPOSITION:  PACU - hemodynamically stable.   Delay start of Pharmacological VTE agent (>24hrs) due to surgical blood loss or risk of bleeding:  yes

## 2023-03-10 NOTE — Anesthesia Procedure Notes (Signed)
Procedure Name: Intubation Date/Time: 03/10/2023 8:30 PM  Performed by: Maybelline Kolarik T, CRNAPre-anesthesia Checklist: Patient identified, Emergency Drugs available, Suction available and Patient being monitored Patient Re-evaluated:Patient Re-evaluated prior to induction Oxygen Delivery Method: Circle system utilized Preoxygenation: Pre-oxygenation with 100% oxygen Induction Type: IV induction Ventilation: Mask ventilation without difficulty Laryngoscope Size: Glidescope and 4 Grade View: Grade I Tube type: Oral Tube size: 7.5 mm Number of attempts: 1 Airway Equipment and Method: Stylet and Oral airway Placement Confirmation: ETT inserted through vocal cords under direct vision, positive ETCO2 and breath sounds checked- equal and bilateral Secured at: 22 cm Tube secured with: Tape Dental Injury: Teeth and Oropharynx as per pre-operative assessment

## 2023-03-11 DIAGNOSIS — M50221 Other cervical disc displacement at C4-C5 level: Secondary | ICD-10-CM | POA: Diagnosis not present

## 2023-03-11 MED ORDER — HYDROCODONE-ACETAMINOPHEN 5-325 MG PO TABS: 1.0000 | ORAL_TABLET | Freq: Four times a day (QID) | ORAL | 0 refills | Status: DC | PRN

## 2023-03-11 MED ORDER — TIZANIDINE HCL 4 MG PO TABS: 4.0000 mg | ORAL_TABLET | Freq: Four times a day (QID) | ORAL | 0 refills | Status: DC | PRN

## 2023-03-11 NOTE — Discharge Summary (Signed)
Physician Discharge Summary  Patient ID: Leon Allen MRN: [id number] DOB/AGE: 06/04/1966 57 y.o.  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 Nong was admitted, taken to the operating room for an uncomplicated cervical discetomy and fusion. Post op he is voiding, ambulating, and tolerating a regular diet. Speaking voice is strong, wound is clean, dry, and without signs of infection. He has 5/5 strength in all extremities at discharge.   Treatments: surgery: ACDF C4/5, nuvasive plate  Discharge Exam: Blood pressure (!) 136/96, pulse 67, temperature 98 F (36.7 C), temperature source Oral, resp. rate 18, height 5\' 10"  (1.778 m), weight 78 kg, SpO2 98 %. General appearance: alert  Disposition: Discharge disposition: 01-Home or Self Care      Other cervical disc displacement, cervical region  Allergies as of 03/11/2023       Reactions   Atorvastatin Other (See Comments)   Chest pressure   Bee Venom Swelling   Penicillins Other (See Comments)   Has patient had a PCN reaction causing immediate rash, facial/tongue/throat swelling, SOB or lightheadedness with hypotension: No Has patient had a PCN reaction causing severe rash involving mucus membranes or skin necrosis: No Has patient had a PCN reaction that required hospitalization No Has patient had a PCN reaction occurring within the last 10 years: No If all of the above answers are "NO", then may proceed with Cephalosporin use.        Medication List     STOP taking these medications    clopidogrel 75 MG tablet Commonly known as: PLAVIX       TAKE these medications    aspirin EC 81 MG tablet Take 81 mg by mouth daily. Swallow whole.   HYDROcodone-acetaminophen 5-325 MG tablet Commonly known as: NORCO/VICODIN Take 1  tablet by mouth every 6 (six) hours as needed for up to 18 days for moderate pain.   isosorbide mononitrate 30 MG 24 hr tablet Commonly known as: IMDUR Take 1.5 tablets (45 mg total) by mouth daily. What changed: how much to take   metoprolol succinate 25 MG 24 hr tablet Commonly known as: TOPROL-XL Take 1 tablet (25 mg total) by mouth daily.   nitroGLYCERIN 0.4 MG SL tablet Commonly known as: NITROSTAT Place 1 tablet (0.4 mg total) under the tongue every 5 (five) minutes as needed for chest pain.   pantoprazole 20 MG tablet Commonly known as: PROTONIX TAKE 1 TABLET BY MOUTH EVERY DAY   rosuvastatin 5 MG tablet Commonly known as: CRESTOR Take 1 tablet (5 mg total) by mouth daily.   tiZANidine 4 MG tablet Commonly known as: ZANAFLEX Take 1 tablet (4 mg total) by mouth every 6 (six) hours as needed for muscle spasms.        Follow-up Information     Coletta Memos, MD Follow up.   Specialty: Neurosurgery Why: keep your scheduled appointment Contact information: [address] [place] [address] [phone]                 Signed: Coletta Memos 03/11/2023, 6:34 AM

## 2023-03-11 NOTE — Evaluation (Signed)
Occupational Therapy Evaluation Patient Details Name: Leon Allen MRN: [id number] DOB: [birth year] Today's Date: 03/11/2023   History of Present Illness Leon Allen is a 57 yo male who underwent ACDF C4/5, nuvasive plate on 1/47. PMHx: CVA, heart attack, situational depression   Clinical Impression   Leon Allen was evaluated s/p the above spine surgery. He is indep and lives alone at baseline. Upon evaluation he was mildly limited by cervical pain and knowledge of precautions. Overall he demonstrated mod I ability to complete ADLs and mobility without DME. Provided cues and education on spinal precautions and compensatory techniques throughout, handout provided and pt demonstrated good recall. Pt does not require further acute OT services. Recommend d/c home with support of family.        Recommendations for follow up therapy are one component of a multi-disciplinary discharge planning process, led by the attending physician.  Recommendations may be updated based on patient status, additional functional criteria and insurance authorization.   Assistance Recommended at Discharge PRN  Patient can return home with the following Assist for transportation;Assistance with cooking/housework    Functional Status Assessment  Patient has had a recent decline in their functional status and demonstrates the ability to make significant improvements in function in a reasonable and predictable amount of time.  Equipment Recommendations  None recommended by OT       Precautions / Restrictions Precautions Precautions: Fall;Cervical Precaution Booklet Issued: Yes (comment) Restrictions Weight Bearing Restrictions: No      Mobility Bed Mobility Overal bed mobility: Independent             General bed mobility comments: log roll.    Transfers Overall transfer level: Independent          Balance Overall balance assessment: No apparent balance deficits (not formally assessed)                ADL either performed or assessed with clinical judgement   ADL Overall ADL's : Needs assistance/impaired         General ADL Comments: nop physical assist needed, cues for compensatory tehcniuqes only - pt demonstrated mod I abilitiy to complete ADLs. No AD or DME needed.     Vision Baseline Vision/History: 0 No visual deficits Vision Assessment?: No apparent visual deficits     Perception Perception Perception Tested?: No   Praxis Praxis Praxis tested?: Within functional limits    Pertinent Vitals/Pain Pain Assessment Pain Assessment: Faces Faces Pain Scale: Hurts little more Pain Location: neck, R hip Pain Descriptors / Indicators: Discomfort Pain Intervention(s): Limited activity within patient's tolerance, Monitored during session        Extremity/Trunk Assessment Upper Extremity Assessment Upper Extremity Assessment: Overall WFL for tasks assessed   Lower Extremity Assessment Lower Extremity Assessment: Overall WFL for tasks assessed   Cervical / Trunk Assessment Cervical / Trunk Assessment: Neck Surgery   Communication Communication Communication: No difficulties   Cognition Arousal/Alertness: Awake/alert Behavior During Therapy: WFL for tasks assessed/performed Overall Cognitive Status: Within Functional Limits for tasks assessed           General Comments  VSS on RA.            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        OT Problem List: Decreased activity tolerance;Impaired balance (sitting and/or standing)         OT Goals(Current goals can be found in the care plan section) Acute Rehab OT Goals Patient Stated Goal: home asap OT Goal Formulation: With patient Time For Goal Achievement: 03/25/23 Potential to Achieve Goals: Good    AM-PAC OT "6 Clicks" Daily Activity     Outcome Measure Help from another person eating meals?: None Help from another person taking care of personal grooming?: None Help from another person toileting, which includes using toliet, bedpan, or urinal?: None Help from another person bathing (including washing, rinsing, drying)?: None Help from another person to put on and taking off regular upper body clothing?: None Help from another person to put on and taking off regular lower body clothing?: None 6 Click Score: 24   End of Session Nurse Communication: Mobility status  Activity Tolerance: Patient tolerated treatment well Patient left: in bed  OT Visit Diagnosis: Other abnormalities of gait and mobility (R26.89)                Time: 0981-1914 OT Time Calculation (min): 14 min Charges:  OT General Charges $OT Visit: 1 Visit OT Evaluation $OT Eval Low Complexity: 1 Low  Derenda Mis, OTR/L Acute Rehabilitation Services Office [phone] Secure Chat Communication Preferred   Donia Pounds 03/11/2023, 8:41 AM

## 2023-03-11 NOTE — Anesthesia Postprocedure Evaluation (Signed)
Anesthesia Post Note  Patient: Leon Allen  Procedure(s) Performed: CERVICAL FOUR-FIVE ANTERIOR CERVICAL DECOMPRESSION/DISCECTOMY FUSION (Spine Cervical)     Patient location during evaluation: PACU Anesthesia Type: General Level of consciousness: awake and alert Pain management: pain level controlled Vital Signs Assessment: post-procedure vital signs reviewed and stable Respiratory status: spontaneous breathing, nonlabored ventilation, respiratory function stable and patient connected to nasal cannula oxygen Cardiovascular status: blood pressure returned to baseline and stable Postop Assessment: no apparent nausea or vomiting Anesthetic complications: no  No notable events documented.  Last Vitals:  Vitals:   03/10/23 2315 03/10/23 2344  BP: (!) 128/97 (!) 136/93  Pulse: 77 81  Resp: 12 20  Temp: 36.7 C 36.8 C  SpO2: 93% 98%    Last Pain:  Vitals:   03/10/23 2344  TempSrc: Oral  PainSc:                  [name]

## 2023-03-11 NOTE — Discharge Instructions (Addendum)
Wound Care Leave incision open to air. You may shower. Do not scrub directly on incision.  Do not put any creams, lotions, or ointments on incision. Activity Walk each and every day, increasing distance each day. No lifting greater than 8 lbs.  Avoid excessive neck motion. No driving for 2 weeks; may ride as a passenger locally.  Diet Resume your normal diet.   Call Your Doctor If Any of These Occur Redness, drainage, or swelling at the wound.  Temperature greater than 101 degrees. Severe pain not relieved by pain medication. Increased difficulty swallowing. Incision starts to come apart. Follow Up Appt Call  [phone]) for problems.  If you have any hardware placed in your spine, you will need an x-ray before your appointment.            Anterior Cervical Fusion Care After Pinching of the nerves is a common cause of long-term pain. When this happens, a procedure called an anterior cervical fusion is sometimes performed. It relieves the pressure on the pinched nerve roots or spinal cord in the neck. An anterior cervical fusion means that the operation is done through the front (anterior) of your neck to fuse bones in your neck together. This procedure is done to relieve the pressure on pinched nerve roots or spinal cord. This operation is done to control the movement of your spine, which may be pressing on the nerves. This may relieve the pain. The procedure that stops the movement of the spine is called a fusion. The cut by the surgeon (incision) is usually within a skin fold line under your chin. After moving the neck muscles gently apart, the neurosurgeon uses an operating microscope and removes the injured intervertebral disk (the cushion or pad of tissue between the bones of the spine). This takes the pressure off the nerves or spinal cord. This is called decompression. The area where the disc was removed is then filled with a bone graft. The graft will fuse the vertebrae  together over time. This means it causes the vertebral bodies to grow together. The bone graft may be obtained from your own bone (your hip for example), or may be obtained from a bone bank. Receiving bone from a bone bank is similar to a blood bank, only the bone comes from human donors who have recently died. This type of graft is referred to as allograft bone. The preformed bone plug is safe and will not be rejected by your body. It does not contain blood cells. In some cases, the surgeon may use hardware in your neck to help stabilize it. This means that metal plates or pins or screws may be used to: Provide extra support to the neck.  Help the bones to grow together more easily.  A cervical fusion procedure takes a couple hours to several hours, depending on what needs to be done. Your caregiver will be able to answer your questions for you. HOME CARE INSTRUCTIONS  It will be normal to have a sore throat and have difficulty swallowing foods for a couple weeks following surgery. See your caregiver if this seems to be getting worse rather than better.  You may resume normal diet and activities as directed or allowed. Generally, walking and stair climbing are fine. Avoid lifting more than ten pounds and do no lifting above your head.  If given a cervical collar, remove only for bathing and eating, or as directed.  Use only showers for cleaning up, with no bathing, until seen.  You  may apply ice to the surgical or bone donor site for 15 to 20 minutes each hour while awake for the first couple days following surgery. Put the ice in a plastic bag and place a towel between the bag of ice and your skin.  Change dressings if necessary or as directed.  You may drive in 10 days  Take prescribed medication as directed. Only take over-the-counter or prescription medicines for pain, discomfort, or fever as directed by your caregiver.  Make an appointment to see your caregiver for suture or staple removal when  instructed.  If physical therapy was prescribed, follow your caregiver's directions.  SEEK IMMEDIATE MEDICAL CARE IF: There is redness, swelling, or increasing pain in the wound.  There is pus coming from the wound.  An unexplained oral temperature over 102 F (38.9 C) develops.  There is a bad smell coming from the wound or dressing.  You have swelling in your calf or leg.  You develop shortness of breath or chest pain.  The wound edges break open after sutures or staples have been removed.  Your pain is not controlled with medicine.  You seem to be getting worse rather than better.  Document Released: 05/20/2004 Document Revised: 06/18/2011 Document Reviewed: 07/26/2008 [facility] Patient Information 2012 [place], [place].

## 2023-03-11 NOTE — Plan of Care (Signed)
  Problem: Education: Goal: Ability to verbalize activity precautions or restrictions will improve Outcome: Completed/Met Goal: Knowledge of the prescribed therapeutic regimen will improve Outcome: Completed/Met Goal: Understanding of discharge needs will improve Outcome: Completed/Met   Problem: Activity: Goal: Ability to avoid complications of mobility impairment will improve Outcome: Completed/Met Goal: Ability to tolerate increased activity will improve Outcome: Completed/Met Goal: Will remain free from falls Outcome: Completed/Met   Problem: Bowel/Gastric: Goal: Gastrointestinal status for postoperative course will improve Outcome: Completed/Met   Problem: Clinical Measurements: Goal: Ability to maintain clinical measurements within normal limits will improve Outcome: Completed/Met Goal: Postoperative complications will be avoided or minimized Outcome: Completed/Met Goal: Diagnostic test results will improve Outcome: Completed/Met   Problem: Pain Management: Goal: Pain level will decrease Outcome: Completed/Met   Problem: Skin Integrity: Goal: Will show signs of wound healing Outcome: Completed/Met   Problem: Health Behavior/Discharge Planning: Goal: Identification of resources available to assist in meeting health care needs will improve Outcome: Completed/Met   Problem: Bladder/Genitourinary: Goal: Urinary functional status for postoperative course will improve Outcome: Completed/Met  Patient alert and oriented, void, ambulate. Surgical site clean and dry. D/c instructions explain and given. Pt. D/c home per order.

## 2023-03-12 ENCOUNTER — Telehealth: Payer: Self-pay | Admitting: Internal Medicine

## 2023-03-12 ENCOUNTER — Encounter (HOSPITAL_COMMUNITY): Payer: Self-pay | Admitting: Neurosurgery

## 2023-03-12 NOTE — Telephone Encounter (Signed)
Daughter stated patient does not currently have a PCP and she is requesting Dr. Izora Ribas write a note for her employer ([employer]) stating she is taking care of the patient for health care purposes.

## 2023-03-13 NOTE — Telephone Encounter (Signed)
Pt daughter called for an update, informed her the nurse is waiting for Dr. Ronnie Doss to respond

## 2023-03-13 NOTE — Telephone Encounter (Signed)
Unable to leave a message - voicemail has not been set up  

## 2023-03-17 NOTE — Telephone Encounter (Addendum)
Patient called back stating he daughter can't apply for FMLA because she is not at her job for a year.  He states she just needs a note that she was with him.  He also has questions about applying for disability.  He would like a call back.

## 2023-03-17 NOTE — Telephone Encounter (Signed)
Patient's daughter Maralyn Sago returned call.  She said she can be reached at [phone].

## 2023-03-17 NOTE — Telephone Encounter (Signed)
Called pt daughter at number requested.  Pt answered phone reports daughter has her own phone line and is not with him. I advised pt to tell daughter she will need to apply for FMLA through her employer and have paperwork sent to pt PCP office. Pt reports will give daughter the message.

## 2023-03-18 NOTE — Telephone Encounter (Signed)
Called pt who reports daughter needs a note saying she accompanied pt to Heart Cath on 08/13/22. Advised pt will leave letter at the front desk for pick up.

## 2023-03-26 ENCOUNTER — Encounter: Payer: Self-pay | Admitting: Student

## 2023-03-26 ENCOUNTER — Ambulatory Visit (INDEPENDENT_AMBULATORY_CARE_PROVIDER_SITE_OTHER): Payer: Medicaid Other | Admitting: Student

## 2023-03-26 VITALS — BP 117/88 | HR 70 | Ht 70.0 in | Wt 173.8 lb

## 2023-03-26 DIAGNOSIS — I25110 Atherosclerotic heart disease of native coronary artery with unstable angina pectoris: Secondary | ICD-10-CM

## 2023-03-26 DIAGNOSIS — N200 Calculus of kidney: Secondary | ICD-10-CM | POA: Diagnosis not present

## 2023-03-26 DIAGNOSIS — E785 Hyperlipidemia, unspecified: Secondary | ICD-10-CM

## 2023-03-26 DIAGNOSIS — M5020 Other cervical disc displacement, unspecified cervical region: Secondary | ICD-10-CM

## 2023-03-26 DIAGNOSIS — Z87891 Personal history of nicotine dependence: Secondary | ICD-10-CM

## 2023-03-26 DIAGNOSIS — R911 Solitary pulmonary nodule: Secondary | ICD-10-CM | POA: Diagnosis not present

## 2023-03-26 DIAGNOSIS — Z7689 Persons encountering health services in other specified circumstances: Secondary | ICD-10-CM

## 2023-03-26 NOTE — Patient Instructions (Addendum)
It was great to see you! Thank you for allowing me to participate in your care!   I recommend that you always bring your medications to each appointment as this makes it easy to ensure we are on the correct medications and helps [place] not miss when refills are needed.  Our plans for today:  - A chest CT was ordered for you. We will call you with the results. Please know that it can take up to 7 days from when you get the CT to have an official read by radiologist. - Please follow-up with me on July 5 at 10:10 AM   Take care and seek immediate care sooner if you develop any concerns. Please remember to show up 15 minutes before your scheduled appointment time!  [name], DO [facility]

## 2023-03-26 NOTE — Progress Notes (Signed)
Subjective:    Patient ID: Leon Allen, male    DOB: Dec 24, 1965, 57 y.o.   MRN: [id number]   CC: New Patient  Complicated UTI Renal calculi 3 cm stone in right kidney.  [facility], was told that they would recommend surgical intervention.  Patient felt call this week to schedule follow-up.  Cervical Neck Surgery Underwent C4/5 anterior cervical discectomy and fusion.  Was experiencing shoulder pain and numbness, 2/2 disc herniation at C4-5.  Not currently in physical therapy, offered referral and patient politely declines.  States significant improvement in strength and symptoms since surgery.   Incidental lung nodules Multiple pulmonary nodules discovered on CT renal stone study on 02/16/2023.  Patient has approximately 75-pack-year history from smoking cigarettes.  Currently has quit since the age of 39, but was smoking 3 packs a day.  Intermittent marijuana use, has quit for 6 months.  Discussed benefits of diagnostic CT, patient agreeable.  No insurance Unfortunately, Mr. Leon Allen does not have health insurance which has significantly placed financial burden on him.  In addition to his neck surgery, he will likely need surgery for right renal stone.  He is interested in potentially receiving disability benefits including Medicare, discussed process through DSS and recommend he speak with disability lawyer.  Additionally he is currently on a payment plan with [facility], however month-to-month payments are becoming more difficulty as he is unable to work with his cervical spine condition.  HPI: PMHx: Past Medical History:  Diagnosis Date   Adrenal adenoma    bilateral   Complication of anesthesia    "@ dentist office; was overdosed"   Coronary artery disease    CVA (cerebral vascular accident) (HCC) 2015   denies residual on 01/17/2016   Daily headache    "lately" (01/17/2016)   Gonorrhea 2015   Heart attack (HCC) 2005   Hematuria 12/2015   Kidney stone     Situational depression    "old girlfriend missing"     Surgical Hx: Past Surgical History:  Procedure Laterality Date   ANTERIOR CERVICAL DECOMP/DISCECTOMY FUSION N/A 03/10/2023   Procedure: CERVICAL FOUR-FIVE ANTERIOR CERVICAL DECOMPRESSION/DISCECTOMY FUSION;  Surgeon: Coletta Memos, MD;  [facility];  Service: Neurosurgery;  Laterality: N/A;  RM 21 3C   APPENDECTOMY     CORONARY ANGIOPLASTY WITH STENT PLACEMENT  2005   CORONARY STENT INTERVENTION N/A 08/13/2022   Procedure: CORONARY STENT INTERVENTION;  Surgeon: Kathleene Hazel, MD;  [facility];  Service: Cardiovascular;  Laterality: N/A;   INGUINAL HERNIA REPAIR Right    LEFT HEART CATH AND CORONARY ANGIOGRAPHY N/A 08/13/2022   Procedure: LEFT HEART CATH AND CORONARY ANGIOGRAPHY;  Surgeon: Kathleene Hazel, MD;  [facility];  Service: Cardiovascular;  Laterality: N/A;   LITHOTRIPSY  X 2     Family Hx: Family History  Problem Relation Age of Onset   Cancer Mother        Hodgkins lymphoma   Breast cancer Sister    Heart attack Other      Social Hx: Current Social History 03/26/2023   Tobacco use: Cigarettes 25 years x 3 packs a day(stopped then), smoked marijuana on and off for 40 years (now quit for 6 months) Alcohol use: 1-2 drinks per month Illicit drug use: Denies Who lives at home: Lives alone  Who would speak for you about health care matters:  Living will or Advance Directive: None Transportation: Drove self  Important Relationships & Pets: 2  german shepherds and cat  Current Stressors: Medical problems  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 aspirin EC 81 MG tablet Take 81 mg by mouth daily. Swallow whole.   clopidogrel 75 MG tablet Commonly known as: PLAVIX Take 75 mg by mouth daily.    isosorbide mononitrate 30 MG 24 hr tablet Commonly known as: IMDUR Take 1.5 tablets (45 mg total) by mouth daily. What changed: how much to take   metoprolol succinate 25 MG 24 hr tablet Commonly known as: TOPROL-XL Take 1 tablet (25 mg total) by mouth daily.   nitroGLYCERIN 0.4 MG SL tablet Commonly known as: NITROSTAT Place 1 tablet (0.4 mg total) under the tongue every 5 (five) minutes as needed for chest pain.   pantoprazole 20 MG tablet Commonly known as: PROTONIX TAKE 1 TABLET BY MOUTH EVERY DAY   rosuvastatin 5 MG tablet Commonly known as: CRESTOR Take 1 tablet (5 mg total) by mouth daily.        Preventative Screening Colonoscopy: Deferred today. PSA: Deferred today. Tetanus vaccine: Deferred today. Shingles vaccine:Deferred today.    Objective:  BP 117/88   Pulse 70   Ht 5\' 10"  (1.778 m)   Wt 173 lb 12.8 oz (78.8 kg)   SpO2 100%   BMI 24.94 kg/m  Vitals and nursing note reviewed  General: no acute distress, pleasant Cardiac: RRR, clear S1 and S2, no murmurs, rubs, or gallops Respiratory: clear to auscultation bilaterally, prolonged expiratory phase, no increased work of breathing Extremities: no edema or cyanosis. Warm, well perfused. 2+ radial and PT pulses bilaterally Skin: warm and dry, no rashes noted Neuro: CN II: PERRL CN III, IV,VI: EOMI CV V: Normal sensation in V1, V2, V3 CVII: Symmetric smile and brow raise CN VIII: Normal hearing CN IX,X: Symmetric palate raise  CN XI: 5/5 shoulder shrug CN XII: Symmetric tongue protrusion  4/5 UE Str on R 2+ LE reflexes  Normal sensation in UE and LE bilaterally  No ataxia with finger to nose, normal heel to shin    Assessment & Plan:   Incidental pulmonary nodule, > 3mm and < 8mm Multiple pulmonary nodules. Most severe: 6 mm left solid pulmonary nodule. Risk factor for lung cancer: 75 pack year history.  Denies respiratory symptoms at this time. -Diagnostic CT without  contrast  Hyperlipidemia LDL goal < 55: LDL 61 on 10/16/22.  Continue rosuvastatin, may need increased dose from 5 mg.  Plan for repeat levels at 1 year mark.  Coronary artery disease No cardiac symptoms today.  Follows closely with cardiologist.  Had successful PTCA/DES x 1 in mid LAD.  Patient on GDMT of clopidogrel, aspirin, isosorbide, metoprolol and rosuvastatin. - Continue to follow with cardiology  Nephrolithiasis Large calculus in the right renal pelvis measuring up to 3.0 cm.  No urinary symptoms today.  Recommend follow[facility]up with [facility], likely will need surgical interventions due to recurrent UTIs. - Follow-up with urology  HNP (herniated nucleus pulposus), cervical S/p anterior cervical discectomy and fusion of C4-5 03/10/2023.  Normal neurologic exam aside from 4/5 upper extremity strength on right, reports this is improved from prior.  Not currently in PT, declines PT referral.  Patient inquired about disability, recommend speaking with representative from Department of social services [facility]. - Continue to monitor  Follow-up recommendations 1.)  May benefit from PFTs 2.)  Discuss  healthcare gaps 3.)  May benefit from a CCM referral   [name], DO PGY-1

## 2023-03-26 NOTE — Assessment & Plan Note (Addendum)
Multiple pulmonary nodules. Most severe: 6 mm left solid pulmonary nodule. Risk factor for lung cancer: 75 pack year history.  Denies respiratory symptoms at this time. -Diagnostic CT without contrast

## 2023-03-26 NOTE — Assessment & Plan Note (Signed)
Large calculus in the right renal pelvis measuring up to 3.0 cm.  No urinary symptoms today.  Recommend follow-up with [facility], likely will need surgical interventions due to recurrent UTIs. - Follow-up with urology

## 2023-03-26 NOTE — Assessment & Plan Note (Signed)
LDL goal < 55: LDL 61 on 10/16/22.  Continue rosuvastatin, may need increased dose from 5 mg.  Plan for repeat levels at 1 year mark.

## 2023-03-26 NOTE — Assessment & Plan Note (Signed)
No cardiac symptoms today.  Follows closely with cardiologist.  Had successful PTCA/DES x 1 in mid LAD.  Patient on GDMT of clopidogrel, aspirin, isosorbide, metoprolol and rosuvastatin. - Continue to follow with cardiology

## 2023-03-26 NOTE — Assessment & Plan Note (Addendum)
S/p anterior cervical discectomy and fusion of C4-5 03/10/2023.  Normal neurologic exam aside from 4/5 upper extremity strength on right, reports this is improved from prior.  Not currently in PT, declines PT referral.  Patient inquired about disability, recommend speaking with representative from Department of social services [facility]. - Continue to monitor

## 2023-03-30 ENCOUNTER — Other Ambulatory Visit: Payer: Self-pay | Admitting: Urology

## 2023-03-30 ENCOUNTER — Encounter (HOSPITAL_COMMUNITY): Payer: Self-pay

## 2023-03-31 ENCOUNTER — Other Ambulatory Visit: Payer: Self-pay | Admitting: Urology

## 2023-04-15 NOTE — Progress Notes (Addendum)
COVID Vaccine Completed: no  Date of COVID positive in last 90 days: yes  PCP - [name], DO Cardiologist - Riley Lam, MD LOV 02/26/23  Cardiac clearance by Eligha Bridegroom 02/26/23 in Epic  Chest CT 04/16/23 Chest x-ray - n/a EKG - 02/26/23 Epic Stress Test - 01/18/16 Epic ECHO - 01/18/16 Epic Cardiac Cath - 08/13/22 Epic Pacemaker/ICD device last checked: n/a Spinal Cord Stimulator: n/a  Bowel Prep - no prep pt  Sleep Study - n/a CPAP -   Fasting Blood Sugar - n/a Checks Blood Sugar _____ times a day  Last dose of GLP1 agonist-  N/A GLP1 instructions:  N/A   Last dose of SGLT-2 inhibitors-  N/A SGLT-2 instructions: N/A   Blood Thinner Instructions:  Plavix, hold 5 days Aspirin Instructions: ASA 325, hold 5 days Last Dose:  04/23/23 0800  Activity level: Can go up a flight of stairs and perform activities of daily living without stopping and without symptoms of chest pain or shortness of breath.   Anesthesia review: CAD, stent, HTN, heart attack, CVA  Patient denies shortness of breath, fever, cough and chest pain at PAT appointment  Patient verbalized understanding of instructions that were given to them at the PAT appointment. Patient was also instructed that they will need to review over the PAT instructions again at home before surgery.

## 2023-04-16 ENCOUNTER — Ambulatory Visit (HOSPITAL_COMMUNITY)
Admission: RE | Admit: 2023-04-16 | Discharge: 2023-04-16 | Disposition: A | Discharge status: Home / Self Care | Payer: Medicaid Other | Source: Ambulatory Visit | Attending: Family Medicine | Admitting: Family Medicine

## 2023-04-16 DIAGNOSIS — Z87891 Personal history of nicotine dependence: Secondary | ICD-10-CM | POA: Diagnosis not present

## 2023-04-16 DIAGNOSIS — R911 Solitary pulmonary nodule: Secondary | ICD-10-CM | POA: Diagnosis not present

## 2023-04-16 NOTE — Patient Instructions (Addendum)
SURGICAL WAITING ROOM VISITATION  Patients having surgery or a procedure may have no more than 2 support people in the waiting area - these visitors may rotate.    Children under the age of 8 must have an adult with them who is not the patient.  Due to an increase in RSV and influenza rates and associated hospitalizations, children ages 87 and under may not visit patients in [facility] hospitals.  If the patient needs to stay at the hospital during part of their recovery, the visitor guidelines for inpatient rooms apply. Pre-op nurse will coordinate an appropriate time for 1 support person to accompany patient in pre-op.  This support person may not rotate.    Please refer to the [facility] website for the visitor guidelines for Inpatients (after your surgery is over and you are in a regular room).    Your procedure is scheduled on: 04/29/23   Report to [facility] Main Entrance    Report to admitting at 6:15 AM   Call this number if you have problems the morning of surgery [phone]   Do not eat food or drink liquids :After Midnight.          If you have questions, please contact your surgeon's office.   FOLLOW BOWEL PREP AND ANY ADDITIONAL PRE OP INSTRUCTIONS YOU RECEIVED FROM YOUR SURGEON'S OFFICE!!!     Oral Hygiene is also important to reduce your risk of infection.                                    Remember - BRUSH YOUR TEETH THE MORNING OF SURGERY WITH YOUR REGULAR TOOTHPASTE  DENTURES WILL BE REMOVED PRIOR TO SURGERY PLEASE DO NOT APPLY "Poly grip" OR ADHESIVES!!!   Take these medicines the morning of surgery with A SIP OF WATER: IMDUR, Metoprolol, Pantoprazole, Rosuvastatin   These are anesthesia recommendations for holding your anticoagulants.  Please contact your prescribing physician to confirm IF it is safe to hold your anticoagulants for this length of time.   Eliquis Apixaban   72 hours   Xarelto Rivaroxaban   72 hours  Plavix Clopidogrel   120  hours  Pletal Cilostazol   120 hours                                You may not have any metal on your body including jewelry, and body piercing             Do not wear lotions, powders, cologne, or deodorant              Men may shave face and neck.   Do not bring valuables to the hospital. [facility] IS NOT             RESPONSIBLE   FOR VALUABLES.   Contacts, glasses, dentures or bridgework may not be worn into surgery.   Bring small overnight bag day of surgery.   DO NOT BRING YOUR HOME MEDICATIONS TO THE HOSPITAL. PHARMACY WILL DISPENSE MEDICATIONS LISTED ON YOUR MEDICATION LIST TO YOU DURING YOUR ADMISSION IN THE HOSPITAL!              Please read over the following fact sheets you were given: IF YOU HAVE QUESTIONS ABOUT YOUR PRE-OP INSTRUCTIONS PLEASE CALL [phone] [place]    If you received a COVID test  during your pre-op visit  it is requested that you wear a mask when out in public, stay away from anyone that may not be feeling well and notify your surgeon if you develop symptoms. If you test positive for Covid or have been in contact with anyone that has tested positive in the last 10 days please notify you surgeon.    [facility] - Preparing for Surgery Before surgery, you can play an important role.  Because skin is not sterile, your skin needs to be as free of germs as possible.  You can reduce the number of germs on your skin by washing with CHG (chlorahexidine gluconate) soap before surgery.  CHG is an antiseptic cleaner which kills germs and bonds with the skin to continue killing germs even after washing. Please DO NOT use if you have an allergy to CHG or antibacterial soaps.  If your skin becomes reddened/irritated stop using the CHG and inform your nurse when you arrive at Short Stay. Do not shave (including legs and underarms) for at least 48 hours prior to the first CHG shower.  You may shave your face/neck.  Please follow these instructions carefully:  1.   Shower with CHG Soap the night before surgery and the  morning of surgery.  2.  If you choose to wash your hair, wash your hair first as usual with your normal  shampoo.  3.  After you shampoo, rinse your hair and body thoroughly to remove the shampoo.                             4.  Use CHG as you would any other liquid soap.  You can apply chg directly to the skin and wash.  Gently with a scrungie or clean washcloth.  5.  Apply the CHG Soap to your body ONLY FROM THE NECK DOWN.   Do   not use on face/ open                           Wound or open sores. Avoid contact with eyes, ears mouth and   genitals (private parts).                       Wash face,  Genitals (private parts) with your normal soap.             6.  Wash thoroughly, paying special attention to the area where your    surgery  will be performed.  7.  Thoroughly rinse your body with warm water from the neck down.  8.  DO NOT shower/wash with your normal soap after using and rinsing off the CHG Soap.                9.  Pat yourself dry with a clean towel.            10.  Wear clean pajamas.            11.  Place clean sheets on your bed the night of your first shower and do not  sleep with pets. Day of Surgery : Do not apply any lotions/deodorants the morning of surgery.  Please wear clean clothes to the hospital/surgery center.  FAILURE TO FOLLOW THESE INSTRUCTIONS MAY RESULT IN THE CANCELLATION OF YOUR SURGERY  PATIENT SIGNATURE_________________________________  NURSE SIGNATURE__________________________________  ________________________________________________________________________

## 2023-04-20 ENCOUNTER — Encounter (HOSPITAL_COMMUNITY): Payer: Self-pay

## 2023-04-20 ENCOUNTER — Other Ambulatory Visit: Payer: Self-pay

## 2023-04-20 ENCOUNTER — Encounter (HOSPITAL_COMMUNITY)
Admission: RE | Admit: 2023-04-20 | Discharge: 2023-04-20 | Disposition: A | Discharge status: Home / Self Care | Payer: Medicaid Other | Source: Ambulatory Visit | Attending: Urology | Admitting: Urology

## 2023-04-20 VITALS — BP 127/84 | HR 64 | Temp 99.0°F | Resp 16 | Ht 70.0 in | Wt 174.0 lb

## 2023-04-20 DIAGNOSIS — N200 Calculus of kidney: Secondary | ICD-10-CM | POA: Insufficient documentation

## 2023-04-20 DIAGNOSIS — K219 Gastro-esophageal reflux disease without esophagitis: Secondary | ICD-10-CM | POA: Diagnosis not present

## 2023-04-20 DIAGNOSIS — Z8673 Personal history of transient ischemic attack (TIA), and cerebral infarction without residual deficits: Secondary | ICD-10-CM | POA: Insufficient documentation

## 2023-04-20 DIAGNOSIS — Z955 Presence of coronary angioplasty implant and graft: Secondary | ICD-10-CM | POA: Insufficient documentation

## 2023-04-20 DIAGNOSIS — I252 Old myocardial infarction: Secondary | ICD-10-CM | POA: Diagnosis not present

## 2023-04-20 DIAGNOSIS — Z87891 Personal history of nicotine dependence: Secondary | ICD-10-CM | POA: Diagnosis not present

## 2023-04-20 DIAGNOSIS — I2510 Atherosclerotic heart disease of native coronary artery without angina pectoris: Secondary | ICD-10-CM | POA: Diagnosis not present

## 2023-04-20 DIAGNOSIS — I10 Essential (primary) hypertension: Secondary | ICD-10-CM | POA: Diagnosis not present

## 2023-04-20 DIAGNOSIS — Z01812 Encounter for preprocedural laboratory examination: Secondary | ICD-10-CM | POA: Diagnosis present

## 2023-04-20 HISTORY — DX: Calculus of gallbladder without cholecystitis without obstruction: K80.20

## 2023-04-20 HISTORY — DX: Cardiac murmur, unspecified: R01.1

## 2023-04-20 HISTORY — DX: Gastro-esophageal reflux disease without esophagitis: K21.9

## 2023-04-20 LAB — CBC
HCT: 44.5 % (ref 39.0–52.0)
Hemoglobin: 14.7 g/dL (ref 13.0–17.0)
MCH: 30.3 pg (ref 26.0–34.0)
MCHC: 33 g/dL (ref 30.0–36.0)
MCV: 91.8 fL (ref 80.0–100.0)
Platelets: 199 10*3/uL (ref 150–400)
RBC: 4.85 MIL/uL (ref 4.22–5.81)
RDW: 13 % (ref 11.5–15.5)
WBC: 6.5 10*3/uL (ref 4.0–10.5)
nRBC: 0 % (ref 0.0–0.2)

## 2023-04-20 LAB — BASIC METABOLIC PANEL
Anion gap: 9 (ref 5–15)
BUN: 24 mg/dL — ABNORMAL HIGH (ref 6–20)
CO2: 22 mmol/L (ref 22–32)
Calcium: 8.9 mg/dL (ref 8.9–10.3)
Chloride: 106 mmol/L (ref 98–111)
Creatinine, Ser: 0.87 mg/dL (ref 0.61–1.24)
GFR, Estimated: >60 mL/min (ref >60)
Glucose, Bld: 101 mg/dL — ABNORMAL HIGH (ref 70–99)
Potassium: 3.7 mmol/L (ref 3.5–5.1)
Sodium: 137 mmol/L (ref 135–145)

## 2023-04-21 NOTE — Anesthesia Preprocedure Evaluation (Addendum)
Anesthesia Evaluation  Patient identified by MRN, date of birth, ID band Patient awake    Reviewed: Allergy & Precautions, NPO status , Patient's Chart, lab work & pertinent test results  Airway Mallampati: II  TM Distance: >3 FB Neck ROM: Full    Dental   Pulmonary COPD, former smoker   breath sounds clear to auscultation       Cardiovascular hypertension, Pt. on medications and Pt. on home beta blockers + CAD, + Past MI and + Cardiac Stents   Rhythm:Regular Rate:Normal     Neuro/Psych  Neuromuscular disease CVA    GI/Hepatic Neg liver ROS,GERD  ,,  Endo/Other  negative endocrine ROS    Renal/GU Renal disease     Musculoskeletal   Abdominal   Peds  Hematology negative hematology ROS (+)   Anesthesia Other Findings   Reproductive/Obstetrics                             Anesthesia Physical Anesthesia Plan  ASA: 3  Anesthesia Plan: General   Post-op Pain Management: Tylenol PO (pre-op)* and Ketamine IV*   Induction: Intravenous  PONV Risk Score and Plan: 2 and Dexamethasone, Ondansetron, Midazolam and Treatment may vary due to age or medical condition  Airway Management Planned: Oral ETT and Video Laryngoscope Planned  Additional Equipment:   Intra-op Plan:   Post-operative Plan: Extubation in OR  Informed Consent: I have reviewed the patients History and Physical, chart, labs and discussed the procedure including the risks, benefits and alternatives for the proposed anesthesia with the patient or authorized representative who has indicated his/her understanding and acceptance.     Dental advisory given  Plan Discussed with: CRNA  Anesthesia Plan Comments: (2IV's, GETA, Glidescope)       Anesthesia Quick Evaluation

## 2023-04-21 NOTE — Progress Notes (Signed)
Anesthesia Chart Review   Case: [id number] Date/Time: 04/29/23 0815   Procedure: RIGHT NEPHROLITHOTOMY PERCUTANEOUS-SURGEON ACCESS (Right) - 210 MINUTES   Anesthesia type: General   Pre-op diagnosis: RIGHT RENAL CALCULI   Location: [facility] PROC[facility]   Surgeons: Crist Fat, MD       DISCUSSION:56 y.o. former smoker with h/o GERD, CAD (anteroapical/apical inferior MI, s/p DES LAD 05/05/04; DES LAD for ISR 08/13/22, CVA, right renal calculi scheduled for above procedure 04/29/2023 with Dr. Berniece Salines.   S/p ACDF C4-5 03/10/2023 with no anesthesia complications noted. .   Per 02/27/23 notation by Eligha Bridegroom, NP, "According to the revised cardiac risk index his perioperative risk of major cardiac event is 11%.  His functional capacity and METS is 6.61 according to the [facility] Activity Status Index. Given past medical history and time since last visit, based on ACC/AHA guidelines, Leon Allen would be at acceptable risk for the planned procedure without further cardiovascular testing.    Per office protocol, he may hold Plavix for 5 days prior to procedure and should resume as soon as hemodynamically stable postoperatively. Ideally aspirin should be continued without interruption, however if the bleeding risk is too great, aspirin may be held for 5-7 days prior to surgery. Please resume aspirin post operatively when it is felt to be safe from a bleeding standpoint." VS: BP 127/84   Pulse 64   Temp 37.2 C (Oral)   Resp 16   Ht 5\' 10"  (1.778 m)   Wt 78.9 kg   SpO2 100%   BMI 24.97 kg/m   PROVIDERS: [name], DO is PCP   Cardiologist - Riley Lam, MD  LABS: Labs reviewed: Acceptable for surgery. (all labs ordered are listed, but only abnormal results are displayed)  Labs Reviewed  BASIC METABOLIC PANEL - Abnormal; Notable for the following components:      Result Value   Glucose, Bld 101 (*)    BUN 24 (*)    All other components within  normal limits  CBC     IMAGES:   EKG:   CV: Echo 01/18/2016 - Left ventricle: The cavity size was normal. Systolic function was    normal. The estimated ejection fraction was in the range of 55%    to 60%. Wall motion was normal; there were no regional wall    motion abnormalities. Left ventricular diastolic function    parameters were normal.  - Aortic valve: Trileaflet; normal thickness leaflets. There was no    regurgitation.  - Aortic root: The aortic root was normal in size.  - Mitral valve: There was no regurgitation.  - Left atrium: The atrium was normal in size.  - Right ventricle: The cavity size was normal. Wall thickness was    normal. Systolic function was normal.  - Right atrium: The atrium was normal in size.  - Tricuspid valve: There was trivial regurgitation.  - Pulmonic valve: There was no regurgitation.  - Pulmonary arteries: The main pulmonary artery was normal-sized.    Systolic pressure was within the normal range.  - Inferior vena cava: The vessel was normal in size.  - Pericardium, extracardiac: There was no pericardial effusion.  Past Medical History:  Diagnosis Date   Adrenal adenoma    bilateral   Complication of anesthesia    "@ dentist office; was overdosed"   Coronary artery disease    CVA (cerebral vascular accident) (HCC) 10/20/2013   denies residual on 01/17/2016   Daily headache    "  lately" (01/17/2016)   Gallstones    GERD (gastroesophageal reflux disease)    Gonorrhea 10/20/2013   Heart attack (HCC) 10/21/2003   Heart murmur    Hematuria 12/19/2015   Kidney stone    Situational depression    "old girlfriend missing"    Past Surgical History:  Procedure Laterality Date   ANTERIOR CERVICAL DECOMP/DISCECTOMY FUSION N/A 03/10/2023   Procedure: CERVICAL FOUR-FIVE ANTERIOR CERVICAL DECOMPRESSION/DISCECTOMY FUSION;  Surgeon: Coletta Memos, MD;  Location: [facility];  Service: Neurosurgery;  Laterality: N/A;  RM 21 3C   APPENDECTOMY      CORONARY ANGIOPLASTY WITH STENT PLACEMENT  2005   CORONARY STENT INTERVENTION N/A 08/13/2022   Procedure: CORONARY STENT INTERVENTION;  Surgeon: Kathleene Hazel, MD;  [facility];  Service: Cardiovascular;  Laterality: N/A;   INGUINAL HERNIA REPAIR Right    LEFT HEART CATH AND CORONARY ANGIOGRAPHY N/A 08/13/2022   Procedure: LEFT HEART CATH AND CORONARY ANGIOGRAPHY;  Surgeon: Kathleene Hazel, MD;  [facility]: [facility];  Service: Cardiovascular;  Laterality: N/A;   LITHOTRIPSY  X 2    MEDICATIONS:  ascorbic acid (VITAMIN C) 500 MG tablet   aspirin 81 MG chewable tablet   clopidogrel (PLAVIX) 75 MG tablet   isosorbide mononitrate (IMDUR) 60 MG 24 hr tablet   metoprolol succinate (TOPROL-XL) 25 MG 24 hr tablet   nitroGLYCERIN (NITROSTAT) 0.4 MG SL tablet   pantoprazole (PROTONIX) 20 MG tablet   rosuvastatin (CRESTOR) 5 MG tablet   vitamin B-12 (CYANOCOBALAMIN) 500 MCG tablet   No current facility-administered medications for this encounter.    Jodell Cipro Ward, PA-C [facility] Pre-Surgical Testing [phone]

## 2023-04-24 ENCOUNTER — Ambulatory Visit (INDEPENDENT_AMBULATORY_CARE_PROVIDER_SITE_OTHER): Payer: Medicaid Other | Admitting: Student

## 2023-04-24 ENCOUNTER — Encounter: Payer: Self-pay | Admitting: Student

## 2023-04-24 VITALS — BP 112/70 | HR 68 | Ht 70.0 in | Wt 175.8 lb

## 2023-04-24 DIAGNOSIS — R0609 Other forms of dyspnea: Secondary | ICD-10-CM | POA: Insufficient documentation

## 2023-04-24 DIAGNOSIS — J449 Chronic obstructive pulmonary disease, unspecified: Secondary | ICD-10-CM

## 2023-04-24 DIAGNOSIS — R911 Solitary pulmonary nodule: Secondary | ICD-10-CM

## 2023-04-24 DIAGNOSIS — Z5989 Other problems related to housing and economic circumstances: Secondary | ICD-10-CM

## 2023-04-24 MED ORDER — ALBUTEROL SULFATE HFA 108 (90 BASE) MCG/ACT IN AERS
2.0000 | INHALATION_SPRAY | Freq: Four times a day (QID) | RESPIRATORY_TRACT | 2 refills | Status: DC | PRN
Start: 2023-04-24 — End: 2023-07-02

## 2023-04-24 NOTE — Patient Instructions (Signed)
It was great to see you! Thank you for allowing me to participate in your care!   I recommend that you always bring your medications to each appointment as this makes it easy to ensure we are on the correct medications and helps [place] not miss when refills are needed.  Our plans for today:  - Use 2 puffs of albuterol as needed when you feel short of breath - We will repeat CT scan in 6 months - Follow-up scheduled for 07/20/2023 at 10:10 AM - I have sent a referral to our social work team   Take care and seek immediate care sooner if you develop any concerns. Please remember to show up 15 minutes before your scheduled appointment time!  [name], DO [facility]

## 2023-04-24 NOTE — Progress Notes (Signed)
    SUBJECTIVE:   CHIEF COMPLAINT / HPI:   Lung Nodules Incidental lung nodules found in April 2024.  Repeat imaging 04/16/2023 showed stable nodules, largest measuring 6 mm on left lung base.  Per guidelines for management of incidental pulmonary nodules detected on CT imaging, recommendation is for repeat CT in 6 months.  Discussed with patient.  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  COPD Intermittent SOB.  CT with evidence of bronchial wall thickening with moderate centrilobular/mild paraseptal emphysema.  Based on smoking history and CT findings, I suspect COPD.  Discussed PFT, patient is without insurance and would like to wait at this time.  Patient has stopped smoking cigarettes for approximately 6 months, still using marijuana intermittently.  No health insurance Patient reports he applied for Medicaid and disability services.  He would like additional help in navigating medical bills and insurance options.  PERTINENT  PMH / PSH: History of CVA, CAD (s/p stent 6 months ago), 75 pack years, hyperlipidemia, marijuana use  OBJECTIVE:   BP 112/70   Pulse 68   Ht 5\' 10"  (1.778 m)   Wt 175 lb 12.8 oz (79.7 kg)   SpO2 98%   BMI 25.22 kg/m    General: NAD, pleasant Cardio: RRR, no MRG. Cap Refill <2s. Respiratory: CTAB, normal wob on RA Skin: Warm and dry  ASSESSMENT/PLAN:   Incidental pulmonary nodule, > 3mm and < 8mm Plan for repeat noncontrast CT in 6 months.  Patient expressed understanding and agreement with plan. - To be scheduled after insurance is acquired - Scheduled follow-up appointment with PCP in 3 months to discuss further  Chronic obstructive pulmonary disease (HCC) Asymptomatic today, benign physical exam.  Evidence of COPD on CT scan. Plan for PFT when insurance is required.  Unfortunately, unable to afford maintenance inhalers due to cost without insurance.  Will try albuterol as needed. -  Albuterol as needed - Follow-up in 3 months with PCP   SDOH No insurance, difficulty with medical bills and affording medication. - Ref [phone] to assist with obtaining insurance, paying medical bills  [name], DO [facility]

## 2023-04-24 NOTE — Assessment & Plan Note (Addendum)
Plan for repeat noncontrast CT in 6 months.  Patient expressed understanding and agreement with plan. - To be scheduled after insurance is acquired - Scheduled follow-up appointment with PCP in 3 months to discuss further

## 2023-04-24 NOTE — Assessment & Plan Note (Addendum)
Asymptomatic today, benign physical exam.  Evidence of COPD on CT scan. Plan for PFT when insurance is required.  Unfortunately, unable to afford maintenance inhalers due to cost without insurance.  Will try albuterol as needed. - Albuterol as needed - Follow-up in 3 months with PCP

## 2023-04-29 ENCOUNTER — Ambulatory Visit (HOSPITAL_BASED_OUTPATIENT_CLINIC_OR_DEPARTMENT_OTHER): Payer: Medicaid Other | Admitting: Anesthesiology

## 2023-04-29 ENCOUNTER — Inpatient Hospital Stay (HOSPITAL_COMMUNITY)
Admission: RE | Admit: 2023-04-29 | Discharge: 2023-05-01 | DRG: 661 | Disposition: A | Discharge status: Home / Self Care | Payer: Medicaid Other | Attending: Urology | Admitting: Urology

## 2023-04-29 ENCOUNTER — Ambulatory Visit (HOSPITAL_COMMUNITY): Payer: Medicaid Other

## 2023-04-29 ENCOUNTER — Encounter (HOSPITAL_COMMUNITY)
Admission: RE | Disposition: A | Discharge status: Home / Self Care | Payer: Self-pay | Source: Home / Self Care | Attending: Urology

## 2023-04-29 ENCOUNTER — Other Ambulatory Visit: Payer: Self-pay

## 2023-04-29 ENCOUNTER — Encounter (HOSPITAL_COMMUNITY): Payer: Self-pay | Admitting: Urology

## 2023-04-29 ENCOUNTER — Ambulatory Visit (HOSPITAL_COMMUNITY): Payer: Medicaid Other | Admitting: Physician Assistant

## 2023-04-29 DIAGNOSIS — I25110 Atherosclerotic heart disease of native coronary artery with unstable angina pectoris: Secondary | ICD-10-CM

## 2023-04-29 DIAGNOSIS — Z7902 Long term (current) use of antithrombotics/antiplatelets: Secondary | ICD-10-CM

## 2023-04-29 DIAGNOSIS — Z981 Arthrodesis status: Secondary | ICD-10-CM

## 2023-04-29 DIAGNOSIS — Z87891 Personal history of nicotine dependence: Secondary | ICD-10-CM

## 2023-04-29 DIAGNOSIS — E785 Hyperlipidemia, unspecified: Secondary | ICD-10-CM | POA: Diagnosis not present

## 2023-04-29 DIAGNOSIS — Z91030 Bee allergy status: Secondary | ICD-10-CM

## 2023-04-29 DIAGNOSIS — I2510 Atherosclerotic heart disease of native coronary artery without angina pectoris: Secondary | ICD-10-CM | POA: Diagnosis present

## 2023-04-29 DIAGNOSIS — Z7982 Long term (current) use of aspirin: Secondary | ICD-10-CM

## 2023-04-29 DIAGNOSIS — Z87442 Personal history of urinary calculi: Secondary | ICD-10-CM

## 2023-04-29 DIAGNOSIS — N200 Calculus of kidney: Secondary | ICD-10-CM | POA: Diagnosis not present

## 2023-04-29 DIAGNOSIS — Z79899 Other long term (current) drug therapy: Secondary | ICD-10-CM

## 2023-04-29 DIAGNOSIS — Z8673 Personal history of transient ischemic attack (TIA), and cerebral infarction without residual deficits: Secondary | ICD-10-CM

## 2023-04-29 DIAGNOSIS — Z955 Presence of coronary angioplasty implant and graft: Secondary | ICD-10-CM

## 2023-04-29 DIAGNOSIS — I10 Essential (primary) hypertension: Secondary | ICD-10-CM

## 2023-04-29 DIAGNOSIS — N202 Calculus of kidney with calculus of ureter: Principal | ICD-10-CM | POA: Diagnosis present

## 2023-04-29 DIAGNOSIS — Z880 Allergy status to penicillin: Secondary | ICD-10-CM

## 2023-04-29 DIAGNOSIS — Z888 Allergy status to other drugs, medicaments and biological substances status: Secondary | ICD-10-CM

## 2023-04-29 DIAGNOSIS — Z8249 Family history of ischemic heart disease and other diseases of the circulatory system: Secondary | ICD-10-CM

## 2023-04-29 DIAGNOSIS — Z803 Family history of malignant neoplasm of breast: Secondary | ICD-10-CM

## 2023-04-29 DIAGNOSIS — I252 Old myocardial infarction: Secondary | ICD-10-CM

## 2023-04-29 DIAGNOSIS — K219 Gastro-esophageal reflux disease without esophagitis: Secondary | ICD-10-CM | POA: Diagnosis present

## 2023-04-29 DIAGNOSIS — Z807 Family history of other malignant neoplasms of lymphoid, hematopoietic and related tissues: Secondary | ICD-10-CM

## 2023-04-29 HISTORY — PX: NEPHROLITHOTOMY: SHX5134

## 2023-04-29 LAB — ABO/RH: ABO/RH(D): O POS

## 2023-04-29 LAB — TYPE AND SCREEN
ABO/RH(D): O POS
Antibody Screen: NEGATIVE

## 2023-04-29 LAB — BASIC METABOLIC PANEL
Anion gap: 7 (ref 5–15)
BUN: 19 mg/dL (ref 6–20)
CO2: 22 mmol/L (ref 22–32)
Calcium: 7.4 mg/dL — ABNORMAL LOW (ref 8.9–10.3)
Chloride: 108 mmol/L (ref 98–111)
Creatinine, Ser: 0.89 mg/dL (ref 0.61–1.24)
GFR, Estimated: >60 mL/min (ref >60)
Glucose, Bld: 175 mg/dL — ABNORMAL HIGH (ref 70–99)
Potassium: 4.2 mmol/L (ref 3.5–5.1)
Sodium: 137 mmol/L (ref 135–145)

## 2023-04-29 LAB — HEMOGLOBIN AND HEMATOCRIT, BLOOD
HCT: 43.1 % (ref 39.0–52.0)
Hemoglobin: 13.8 g/dL (ref 13.0–17.0)

## 2023-04-29 SURGERY — NEPHROLITHOTOMY PERCUTANEOUS
Anesthesia: General | Laterality: Right

## 2023-04-29 MED ORDER — FENTANYL CITRATE PF 50 MCG/ML IJ SOSY
PREFILLED_SYRINGE | INTRAMUSCULAR | Status: AC
  Filled 2023-04-29: qty 1

## 2023-04-29 MED ORDER — FENTANYL CITRATE PF 50 MCG/ML IJ SOSY
25.0000 ug | PREFILLED_SYRINGE | INTRAMUSCULAR | Status: DC | PRN
  Administered 2023-04-29: 50 ug via INTRAVENOUS

## 2023-04-29 MED ORDER — MIDAZOLAM HCL 2 MG/2ML IJ SOLN
INTRAMUSCULAR | Status: AC
  Filled 2023-04-29: qty 2

## 2023-04-29 MED ORDER — PROPOFOL 10 MG/ML IV BOLUS
INTRAVENOUS | Status: DC | PRN
  Administered 2023-04-29: 200 mg via INTRAVENOUS

## 2023-04-29 MED ORDER — CIPROFLOXACIN IN D5W 400 MG/200ML IV SOLN
400.0000 mg | Freq: Two times a day (BID) | INTRAVENOUS | Status: AC
  Administered 2023-04-29 – 2023-04-30 (×3): 400 mg via INTRAVENOUS
  Filled 2023-04-29 (×3): qty 200

## 2023-04-29 MED ORDER — ACETAMINOPHEN 10 MG/ML IV SOLN: 1000.0000 mg | Freq: Once | INTRAVENOUS | Status: AC | PRN

## 2023-04-29 MED ORDER — LACTATED RINGERS IV SOLN: INTRAVENOUS | Status: DC | PRN

## 2023-04-29 MED ORDER — 0.9 % SODIUM CHLORIDE (POUR BTL) OPTIME
TOPICAL | Status: DC | PRN
  Administered 2023-04-29: 1000 mL

## 2023-04-29 MED ORDER — PROPOFOL 10 MG/ML IV BOLUS
INTRAVENOUS | Status: AC
  Filled 2023-04-29: qty 20

## 2023-04-29 MED ORDER — HYDROMORPHONE HCL 1 MG/ML IJ SOLN
1.0000 mg | INTRAMUSCULAR | Status: DC | PRN
  Administered 2023-04-29 – 2023-05-01 (×6): 1 mg via INTRAVENOUS
  Filled 2023-04-29 (×6): qty 1

## 2023-04-29 MED ORDER — NITROGLYCERIN 0.4 MG SL SUBL: 0.4000 mg | SUBLINGUAL_TABLET | SUBLINGUAL | Status: DC | PRN

## 2023-04-29 MED ORDER — CHLORHEXIDINE GLUCONATE 0.12 % MT SOLN
15.0000 mL | Freq: Once | OROMUCOSAL | Status: AC
  Administered 2023-04-29: 15 mL via OROMUCOSAL

## 2023-04-29 MED ORDER — ACETAMINOPHEN 500 MG PO TABS
1000.0000 mg | ORAL_TABLET | Freq: Four times a day (QID) | ORAL | Status: DC
  Administered 2023-04-30 – 2023-05-01 (×5): 1000 mg via ORAL
  Filled 2023-04-29 (×6): qty 2

## 2023-04-29 MED ORDER — MIDAZOLAM HCL 5 MG/5ML IJ SOLN
INTRAMUSCULAR | Status: DC | PRN
  Administered 2023-04-29: 2 mg via INTRAVENOUS

## 2023-04-29 MED ORDER — IOHEXOL 300 MG/ML  SOLN
INTRAMUSCULAR | Status: DC | PRN
  Administered 2023-04-29: 50 mL

## 2023-04-29 MED ORDER — PANTOPRAZOLE SODIUM 20 MG PO TBEC: 20.0000 mg | DELAYED_RELEASE_TABLET | Freq: Every day | ORAL | Status: DC

## 2023-04-29 MED ORDER — METOPROLOL SUCCINATE ER 50 MG PO TB24
25.0000 mg | ORAL_TABLET | Freq: Every day | ORAL | Status: DC
  Administered 2023-04-30 – 2023-05-01 (×2): 25 mg via ORAL
  Filled 2023-04-29 (×2): qty 1

## 2023-04-29 MED ORDER — ONDANSETRON HCL 4 MG/2ML IJ SOLN
INTRAMUSCULAR | Status: DC | PRN
  Administered 2023-04-29: 4 mg via INTRAVENOUS

## 2023-04-29 MED ORDER — LACTATED RINGERS IV SOLN: INTRAVENOUS | Status: DC

## 2023-04-29 MED ORDER — METOPROLOL SUCCINATE ER 25 MG PO TB24: 25.0000 mg | ORAL_TABLET | Freq: Every day | ORAL | Status: DC

## 2023-04-29 MED ORDER — AMISULPRIDE (ANTIEMETIC) 5 MG/2ML IV SOLN: 10.0000 mg | Freq: Once | INTRAVENOUS | Status: DC | PRN

## 2023-04-29 MED ORDER — SODIUM CHLORIDE 0.9 % IR SOLN
Status: DC | PRN
  Administered 2023-04-29: 57000 mL via INTRAVESICAL

## 2023-04-29 MED ORDER — PANTOPRAZOLE SODIUM 20 MG PO TBEC
20.0000 mg | DELAYED_RELEASE_TABLET | Freq: Every day | ORAL | Status: DC
  Administered 2023-04-30 – 2023-05-01 (×2): 20 mg via ORAL
  Filled 2023-04-29 (×2): qty 1

## 2023-04-29 MED ORDER — HYDROMORPHONE HCL 1 MG/ML IJ SOLN
0.2500 mg | INTRAMUSCULAR | Status: DC | PRN
  Administered 2023-04-29: 0.5 mg via INTRAVENOUS

## 2023-04-29 MED ORDER — DEXMEDETOMIDINE HCL IN NACL 80 MCG/20ML IV SOLN
INTRAVENOUS | Status: DC | PRN
  Administered 2023-04-29 (×2): 8 ug via INTRAVENOUS

## 2023-04-29 MED ORDER — CIPROFLOXACIN IN D5W 400 MG/200ML IV SOLN
400.0000 mg | INTRAVENOUS | Status: AC
  Administered 2023-04-29: 400 mg via INTRAVENOUS
  Filled 2023-04-29: qty 200

## 2023-04-29 MED ORDER — ONDANSETRON HCL 4 MG/2ML IJ SOLN
4.0000 mg | Freq: Four times a day (QID) | INTRAMUSCULAR | Status: DC | PRN
  Administered 2023-04-29 – 2023-04-30 (×3): 4 mg via INTRAVENOUS
  Filled 2023-04-29 (×3): qty 2

## 2023-04-29 MED ORDER — LIDOCAINE HCL (CARDIAC) PF 100 MG/5ML IV SOSY
PREFILLED_SYRINGE | INTRAVENOUS | Status: DC | PRN
  Administered 2023-04-29: 100 mg via INTRAVENOUS

## 2023-04-29 MED ORDER — ACETAMINOPHEN 500 MG PO TABS
1000.0000 mg | ORAL_TABLET | Freq: Once | ORAL | Status: AC
  Administered 2023-04-29: 1000 mg via ORAL
  Filled 2023-04-29: qty 2

## 2023-04-29 MED ORDER — BUPIVACAINE HCL (PF) 0.5 % IJ SOLN
INTRAMUSCULAR | Status: DC | PRN
  Administered 2023-04-29: 15 mL

## 2023-04-29 MED ORDER — DEXAMETHASONE SODIUM PHOSPHATE 10 MG/ML IJ SOLN
INTRAMUSCULAR | Status: AC
  Filled 2023-04-29: qty 1

## 2023-04-29 MED ORDER — ALBUTEROL SULFATE (2.5 MG/3ML) 0.083% IN NEBU
2.5000 mg | INHALATION_SOLUTION | Freq: Four times a day (QID) | RESPIRATORY_TRACT | Status: DC | PRN
  Administered 2023-04-30: 2.5 mg via RESPIRATORY_TRACT
  Filled 2023-04-29: qty 3

## 2023-04-29 MED ORDER — DIAZEPAM 2 MG PO TABS: 10.0000 mg | ORAL_TABLET | Freq: Once | ORAL | Status: AC | PRN

## 2023-04-29 MED ORDER — OXYCODONE HCL 5 MG PO TABS
ORAL_TABLET | ORAL | Status: AC
  Administered 2023-04-29: 5 mg via ORAL
  Filled 2023-04-29: qty 1

## 2023-04-29 MED ORDER — FENTANYL CITRATE (PF) 100 MCG/2ML IJ SOLN
INTRAMUSCULAR | Status: AC
  Filled 2023-04-29: qty 2

## 2023-04-29 MED ORDER — ONDANSETRON HCL 4 MG/2ML IJ SOLN
INTRAMUSCULAR | Status: AC
  Filled 2023-04-29: qty 2

## 2023-04-29 MED ORDER — FENTANYL CITRATE PF 50 MCG/ML IJ SOSY
PREFILLED_SYRINGE | INTRAMUSCULAR | Status: AC
  Administered 2023-04-29: 50 ug via INTRAVENOUS
  Filled 2023-04-29: qty 1

## 2023-04-29 MED ORDER — ALBUTEROL SULFATE (2.5 MG/3ML) 0.083% IN NEBU: 2.5000 mg | INHALATION_SOLUTION | Freq: Four times a day (QID) | RESPIRATORY_TRACT | Status: DC | PRN

## 2023-04-29 MED ORDER — STERILE WATER FOR IRRIGATION IR SOLN
Status: DC | PRN
  Administered 2023-04-29: 500 mL

## 2023-04-29 MED ORDER — DEXAMETHASONE SODIUM PHOSPHATE 4 MG/ML IJ SOLN
INTRAMUSCULAR | Status: DC | PRN
  Administered 2023-04-29: 4 mg via INTRAVENOUS

## 2023-04-29 MED ORDER — SODIUM CHLORIDE 0.9 % IV SOLN: INTRAVENOUS | Status: DC

## 2023-04-29 MED ORDER — ORAL CARE MOUTH RINSE: 15.0000 mL | Freq: Once | OROMUCOSAL | Status: AC

## 2023-04-29 MED ORDER — ALBUTEROL SULFATE HFA 108 (90 BASE) MCG/ACT IN AERS: 2.0000 | INHALATION_SPRAY | Freq: Four times a day (QID) | RESPIRATORY_TRACT | Status: DC | PRN

## 2023-04-29 MED ORDER — TRAMADOL HCL 50 MG PO TABS
50.0000 mg | ORAL_TABLET | Freq: Four times a day (QID) | ORAL | Status: DC | PRN
  Administered 2023-04-29: 50 mg via ORAL
  Administered 2023-04-30: 100 mg via ORAL
  Filled 2023-04-29: qty 2
  Filled 2023-04-29: qty 1

## 2023-04-29 MED ORDER — ALBUTEROL SULFATE HFA 108 (90 BASE) MCG/ACT IN AERS
INHALATION_SPRAY | RESPIRATORY_TRACT | Status: DC | PRN
  Administered 2023-04-29: 4 via RESPIRATORY_TRACT

## 2023-04-29 MED ORDER — ROCURONIUM BROMIDE 100 MG/10ML IV SOLN
INTRAVENOUS | Status: DC | PRN
  Administered 2023-04-29: 10 mg via INTRAVENOUS
  Administered 2023-04-29 (×3): 20 mg via INTRAVENOUS
  Administered 2023-04-29 (×2): 10 mg via INTRAVENOUS
  Administered 2023-04-29: 20 mg via INTRAVENOUS
  Administered 2023-04-29: 50 mg via INTRAVENOUS

## 2023-04-29 MED ORDER — SUGAMMADEX SODIUM 200 MG/2ML IV SOLN
INTRAVENOUS | Status: DC | PRN
  Administered 2023-04-29: 200 mg via INTRAVENOUS

## 2023-04-29 MED ORDER — OXYCODONE HCL 5 MG PO TABS: 5.0000 mg | ORAL_TABLET | Freq: Once | ORAL | Status: AC | PRN

## 2023-04-29 MED ORDER — ISOSORBIDE MONONITRATE ER 60 MG PO TB24: 60.0000 mg | ORAL_TABLET | Freq: Every day | ORAL | Status: DC

## 2023-04-29 MED ORDER — BUPIVACAINE HCL (PF) 0.5 % IJ SOLN
INTRAMUSCULAR | Status: AC
  Filled 2023-04-29: qty 30

## 2023-04-29 MED ORDER — ACETAMINOPHEN 10 MG/ML IV SOLN
INTRAVENOUS | Status: AC
  Administered 2023-04-29: 1000 mg via INTRAVENOUS
  Filled 2023-04-29: qty 100

## 2023-04-29 MED ORDER — ALBUTEROL SULFATE HFA 108 (90 BASE) MCG/ACT IN AERS
INHALATION_SPRAY | RESPIRATORY_TRACT | Status: AC
  Filled 2023-04-29: qty 6.7

## 2023-04-29 MED ORDER — HYDROMORPHONE HCL 1 MG/ML IJ SOLN
INTRAMUSCULAR | Status: AC
  Filled 2023-04-29: qty 1

## 2023-04-29 MED ORDER — DIAZEPAM 2 MG PO TABS
ORAL_TABLET | ORAL | Status: AC
  Administered 2023-04-29: 10 mg via ORAL
  Filled 2023-04-29: qty 5

## 2023-04-29 MED ORDER — FENTANYL CITRATE (PF) 100 MCG/2ML IJ SOLN
INTRAMUSCULAR | Status: DC | PRN
  Administered 2023-04-29: 100 ug via INTRAVENOUS
  Administered 2023-04-29 (×2): 50 ug via INTRAVENOUS

## 2023-04-29 MED ORDER — ISOSORBIDE MONONITRATE ER 60 MG PO TB24
60.0000 mg | ORAL_TABLET | Freq: Every day | ORAL | Status: DC
  Administered 2023-04-30 – 2023-05-01 (×2): 60 mg via ORAL
  Filled 2023-04-29 (×2): qty 1

## 2023-04-29 SURGICAL SUPPLY — 68 items
APL PRP STRL LF DISP 70% ISPRP (MISCELLANEOUS) ×1
APL SKNCLS STERI-STRIP NONHPOA (GAUZE/BANDAGES/DRESSINGS) ×1
BAG COUNTER SPONGE SURGICOUNT (BAG) IMPLANT
BAG DRN RND TRDRP ANRFLXCHMBR (UROLOGICAL SUPPLIES) ×1
BAG SPNG CNTER NS LX DISP (BAG)
BAG URINE DRAIN 2000ML AR STRL (UROLOGICAL SUPPLIES) IMPLANT
BASKET STONE NCOMPASS (UROLOGICAL SUPPLIES) IMPLANT
BASKET ZERO TIP NITINOL 2.4FR (BASKET) IMPLANT
BENZOIN TINCTURE PRP APPL 2/3 (GAUZE/BANDAGES/DRESSINGS) ×1 IMPLANT
BLADE SURG 15 STRL LF DISP TIS (BLADE) ×1 IMPLANT
BLADE SURG 15 STRL SS (BLADE) ×1
BOOTIES KNEE HIGH SLOAN (MISCELLANEOUS) IMPLANT
BSKT STON RTRVL ZERO TP 2.4FR (BASKET) ×1
CATH 2WAY 30CC 24FR (CATHETERS) IMPLANT
CATH URETERAL DUAL LUMEN 10F (MISCELLANEOUS) ×1 IMPLANT
CATH URETL OPEN 5X70 (CATHETERS) ×1 IMPLANT
CATH UROLOGY TORQUE 65 (CATHETERS) IMPLANT
CATH X-FORCE N30 NEPHROSTOMY (TUBING) ×1 IMPLANT
CHLORAPREP W/TINT 26 (MISCELLANEOUS) ×1 IMPLANT
DRAPE C-ARM 42X120 X-RAY (DRAPES) ×1 IMPLANT
DRAPE LINGEMAN PERC (DRAPES) ×1 IMPLANT
DRAPE SURG IRRIG POUCH 19X23 (DRAPES) ×2 IMPLANT
DRSG TEGADERM 8X12 (GAUZE/BANDAGES/DRESSINGS) ×2 IMPLANT
EXTRACTOR STONE 1.7FRX115CM (UROLOGICAL SUPPLIES) IMPLANT
GAUZE PAD ABD 8X10 STRL (GAUZE/BANDAGES/DRESSINGS) IMPLANT
GAUZE SPONGE 4X4 12PLY STRL (GAUZE/BANDAGES/DRESSINGS) IMPLANT
GLOVE SURG LX STRL 7.5 STRW (GLOVE) ×1 IMPLANT
GOWN STRL REUS W/ TWL XL LVL3 (GOWN DISPOSABLE) ×1 IMPLANT
GOWN STRL REUS W/TWL XL LVL3 (GOWN DISPOSABLE) ×1
GUIDEWIRE AMPLAZ .035X145 (WIRE) ×2 IMPLANT
GUIDEWIRE ANG ZIPWIRE 038X150 (WIRE) IMPLANT
GUIDEWIRE STR DUAL SENSOR (WIRE) ×1 IMPLANT
HLDR NDL AMPLATZ W/INSERTS (MISCELLANEOUS) IMPLANT
HOLDER NEEDLE AMPLATZ W/INSERT (MISCELLANEOUS) IMPLANT
IV CATH AUTO 14GX1.75 SAFE ORG (IV SOLUTION) IMPLANT
IV SET EXTENSION CATH 6 NF (IV SETS) ×1 IMPLANT
KIT BASIN OR (CUSTOM PROCEDURE TRAY) ×1 IMPLANT
KIT PROBE 340X3.4XDISP GRN (MISCELLANEOUS) IMPLANT
KIT PROBE TRILOGY 3.4X340 (MISCELLANEOUS)
KIT PROBE TRILOGY 3.9X350 (MISCELLANEOUS) IMPLANT
KIT TURNOVER KIT A (KITS) IMPLANT
LASER FIB FLEXIVA PULSE ID 365 (Laser) IMPLANT
MANIFOLD NEPTUNE II (INSTRUMENTS) ×1 IMPLANT
MAT ABSORB FLUID 56X50 GRAY (MISCELLANEOUS) ×1 IMPLANT
NDL SPNL 20GX3.5 QUINCKE YW (NEEDLE) IMPLANT
NDL TROCAR 18X15 ECHO (NEEDLE) IMPLANT
NDL TROCAR 18X20 (NEEDLE) IMPLANT
NEEDLE SPNL 20GX3.5 QUINCKE YW (NEEDLE) IMPLANT
NEEDLE TROCAR 18X15 ECHO (NEEDLE) ×1 IMPLANT
NEEDLE TROCAR 18X20 (NEEDLE) IMPLANT
NS IRRIG 1000ML POUR BTL (IV SOLUTION) ×1 IMPLANT
PACK CYSTO (CUSTOM PROCEDURE TRAY) ×1 IMPLANT
SHEATH PEELAWAY SET 9 (SHEATH) IMPLANT
SPONGE T-LAP 4X18 ~~LOC~~+RFID (SPONGE) ×1 IMPLANT
STENT URET 6FRX26 CONTOUR (STENTS) IMPLANT
SUT ETHILON 3 0 PS 1 (SUTURE) IMPLANT
SYR 10ML LL (SYRINGE) ×1 IMPLANT
SYR 20ML LL LF (SYRINGE) ×2 IMPLANT
SYR 50ML LL SCALE MARK (SYRINGE) ×1 IMPLANT
SYR TOOMEY IRRIG 70ML (MISCELLANEOUS) ×1
SYRINGE TOOMEY IRRIG 70ML (MISCELLANEOUS) IMPLANT
TOWEL OR 17X26 10 PK STRL BLUE (TOWEL DISPOSABLE) ×1 IMPLANT
TRACTIP FLEXIVA PULS ID 200XHI (Laser) IMPLANT
TRACTIP FLEXIVA PULSE ID 200 (Laser)
TRAY FOLEY MTR SLVR 16FR STAT (SET/KITS/TRAYS/PACK) ×1 IMPLANT
TUBING CONNECTING 10 (TUBING) ×2 IMPLANT
TUBING STONE CATCHER TRILOGY (MISCELLANEOUS) IMPLANT
TUBING UROLOGY SET (TUBING) ×1 IMPLANT

## 2023-04-29 NOTE — Transfer of Care (Signed)
Immediate Anesthesia Transfer of Care Note  Patient: Leon Allen  Procedure(s) Performed: RIGHT NEPHROLITHOTOMY PERCUTANEOUS-SURGEON ACCESS (Right)  Patient Location: PACU  Anesthesia Type:General  Level of Consciousness: awake and oriented  Airway & Oxygen Therapy: Patient Spontanous Breathing and Patient connected to face mask oxygen  Post-op Assessment: Report given to RN and Post -op Vital signs reviewed and stable  Post vital signs: Reviewed and stable  Last Vitals:  Vitals Value Taken Time  BP 168/89 04/29/23 1430  Temp    Pulse 61 04/29/23 1435  Resp 23 04/29/23 1436  SpO2 93 % 04/29/23 1435  Vitals shown include unvalidated device data.  Last Pain:  Vitals:   04/29/23 0727  TempSrc:   PainSc: 0-No pain         Complications: No notable events documented.

## 2023-04-29 NOTE — Anesthesia Postprocedure Evaluation (Signed)
Anesthesia Post Note  Patient: Leon Allen  Procedure(s) Performed: RIGHT NEPHROLITHOTOMY PERCUTANEOUS-SURGEON ACCESS (Right)     Patient location during evaluation: PACU Anesthesia Type: General Level of consciousness: awake and alert Pain management: pain level controlled Vital Signs Assessment: post-procedure vital signs reviewed and stable Respiratory status: spontaneous breathing, nonlabored ventilation, respiratory function stable and patient connected to nasal cannula oxygen Cardiovascular status: blood pressure returned to baseline and stable Postop Assessment: no apparent nausea or vomiting Anesthetic complications: no  No notable events documented.  Last Vitals:  Vitals:   04/29/23 1500 04/29/23 1515  BP: (!) 154/93 (!) 146/98  Pulse: 77 75  Resp: (!) 22 (!) 21  Temp:    SpO2: 93% 91%    Last Pain:  Vitals:   04/29/23 1455  TempSrc:   PainSc: 9                  Kennieth Rad

## 2023-04-29 NOTE — H&P (Signed)
H&P  Chief Complaint: Right nephrolithiasis   History of Present Illness: Leon Allen is a 57 y.o. year old h/o nephrolithiasis found to have 3cm right UPJ stone. He presents for R PCNL with access.   Pt with heart history, taking plavix but has been held for the past 5 days. Risk and benefits were discussed with patient.   Past Medical History:  Diagnosis Date   Adrenal adenoma    bilateral   Complication of anesthesia    "@ dentist office; was overdosed"   Coronary artery disease    CVA (cerebral vascular accident) (HCC) 10/20/2013   denies residual on 01/17/2016   Daily headache    "lately" (01/17/2016)   Gallstones    GERD (gastroesophageal reflux disease)    Gonorrhea 10/20/2013   Heart attack (HCC) 10/21/2003   Heart murmur    Hematuria 12/19/2015   Kidney stone    Situational depression    "old girlfriend missing"    Past Surgical History:  Procedure Laterality Date   ANTERIOR CERVICAL DECOMP/DISCECTOMY FUSION N/A 03/10/2023   Procedure: CERVICAL FOUR-FIVE ANTERIOR CERVICAL DECOMPRESSION/DISCECTOMY FUSION;  Surgeon: Coletta Memos, MD;  Location: [facility] OR;  Service: Neurosurgery;  Laterality: N/A;  RM 21 3C   APPENDECTOMY     CORONARY ANGIOPLASTY WITH STENT PLACEMENT  2005   CORONARY STENT INTERVENTION N/A 08/13/2022   Procedure: CORONARY STENT INTERVENTION;  Surgeon: Kathleene Hazel, MD;  Location: [facility] INVASIVE CV LAB;  Service: Cardiovascular;  Laterality: N/A;   INGUINAL HERNIA REPAIR Right    LEFT HEART CATH AND CORONARY ANGIOGRAPHY N/A 08/13/2022   Procedure: LEFT HEART CATH AND CORONARY ANGIOGRAPHY;  Surgeon: Kathleene Hazel, MD;  Location: [facility] INVASIVE CV LAB;  Service: Cardiovascular;  Laterality: N/A;   LITHOTRIPSY  X 2      Allergies:  Allergies  Allergen Reactions   Atorvastatin Other (See Comments)    Chest pressure    Bee Venom Swelling   Penicillins Other (See Comments)    Childhood Allergy     Family History  Problem  Relation Age of Onset   Cancer Mother        Hodgkins lymphoma   Breast cancer Sister    Heart attack Other     Social History:  reports that he quit smoking about 7 years ago. His smoking use included cigarettes. He has a 120.00 pack-year smoking history. He has quit using smokeless tobacco.  His smokeless tobacco use included chew. He reports current alcohol use. He reports current drug use. Drug: Marijuana.  ROS: A complete review of systems was performed.  All systems are negative except for pertinent findings as noted.  Physical Exam:  Vital signs in last 24 hours: Temp:  [97.6 F (36.4 C)] 97.6 F (36.4 C) (07/10 0630) Pulse Rate:  [61] 61 (07/10 0630) Resp:  [18] 18 (07/10 0630) BP: (129)/(91) 129/91 (07/10 0630) SpO2:  [97 %] 97 % (07/10 0630) Weight:  [79.7 kg] 79.7 kg (07/10 0727) Constitutional:  Alert and oriented, No acute distress Cardiovascular: Regular rate and rhythm, No JVD Respiratory: Normal respiratory effort, Lungs clear bilaterally GI: Abdomen is soft, nontender, nondistended, no abdominal masses GU: No CVA tenderness Neurologic: Grossly intact, no focal deficits Psychiatric: Normal mood and affect   Laboratory Data:  No results for input(s): "WBC", "HGB", "HCT" in the last 72 hours. No results for input(s): "NA", "K", "CL", "CO2", "GLUCOSE", "BUN", "CREATININE", "CALCIUM" in the last 72 hours. No results for input(s): "LABPT", "INR" in the last 72  hours. No results for input(s): "LABURIN" in the last 72 hours. Results for orders placed or performed during the hospital encounter of 03/10/23  Surgical pcr screen     Status: None   Collection Time: 03/09/23  1:31 PM   Specimen: Nasal Mucosa; Nasal Swab  Result Value Ref Range Status   MRSA, PCR NEGATIVE NEGATIVE Final   Staphylococcus aureus NEGATIVE NEGATIVE Final    Comment: (NOTE) The Xpert SA Assay (FDA approved for NASAL specimens in patients 73 years of age and older), is one component of a  comprehensive surveillance program. It is not intended to diagnose infection nor to guide or monitor treatment. Performed at [facility], [address], [place], [place] [address]      Radiologic Imaging: No results found.  Impression/Assessment:  57 y.o male with 3 cm right renal stone burden  Plan:  Proceed to OR for Right PCNL with access  04/29/2023, 8:16 AM  Jerald Kief, MD, PhD [facility] Resident  [facility]

## 2023-04-29 NOTE — Op Note (Addendum)
Pre-operative diagnosis: right sided nephrolithiasis, >2.0 centimeters  Post-operative diagnosis: as above    Procedure performed:  cystoscopy, right retrograde pyelogram with interpretation, right percutaneous renal access, right nephrolithotomy, right nephrostogram,   right ureteral stent placement   Surgeon: Dr. Crist Fat   Assistant: Jerald Kief, MD, PhD   Anesthesia: General   Complications: None   Specimens: Several small fragments of the stone extracted will be sent to [facility] for stone composition analysis   Findings: -Scout film with evidence of large stone burden in the right renal pelvis -retrograde pyelogram with confirmed large stone burden in the right renal pelvis. No significant hydronephrosis -Access obtained in a interpolar posterior calix via bulleye technique -Most of stone burden removed with trilogy. Evidence of left over stone mainly is distal ureter.  -successful 6Fr x 26JJ stent placed in an antegrade fashion.  -small false passage in the interpolar calix/edge of kidney.    EBL: Approximately 100 cc   Specimens: stone from collecting system - taken to [facility]   Indication: Indication: Leon Allen is a 57 y.o.  patient with  a large burden right nephrolithiasis. The patient presented today for follow-up definitive management. After  reviewing the management options for treatment, elected to proceed with the above surgical procedure(s). We have discussed the potential benefits and risks of the procedure, side effects of the proposed treatment, the likelihood of the patient achieving the goals of the procedure, and any potential problems that might occur during the procedure or recuperation. Informed consent has been obtained.     Description:  Consent was obtained in the preoperative holding area. The patient was marked appropriately and then taken back to the operating room where they were intubated on  the gurney. The patient was flipped prone onto the OR table. Large jelly rolls were placed in the anterior axillary line on both sides allowing the patient's chest and abdomen to fall inbetween. The patient was then prepped and draped in the routine sterile fashion in the right flank. A timeout was then held confirming the proper side and procedure as well as antibiotics were administered.  I then used the flexible cystoscope and passed gently into the patient's urethra under visual guidance. A wire was then placed in the patient's right ureteral orifice and confirmed to be in the right renal pelvis under fluoroscopic guidance. A dual lumen catheter was then placed over the wire under fluorsoscopic guidance. A retrograde pyelogram was performed with the finding above. A super stiff wire was then placed through the dual lumen and confirmed to be in the renal pelvis under fluoroscopic guidance. A flexible ureteroscope was then advanced over the super stiff wire to the renal pelvis. We then performed ureteroscopy and found a posterior interpolar calix for access.   Using the C-arm rotated at 25 and the bulls-eye technique directly on the ureteroscopy with an 18-gauge coaxial needle the interpolar posterior lateral calyx was targeted. Then rotating the C-arm AP depth of our needle was noted to be within the calyx and the inner part of the coaxial needle was removed. Urine was noted to return. A 0.038 sensor wire was then passed through the sheath of the coaxial needle and into the right renal collecting system. The wire was then passed down the ureter and into the bladder using fluoroscopic guide and the sheath of the needle was removed. An angiographic catheter was then advanced into the bladder and the wire removed. A Super Stiff wire was then  passed into the angiographic catheter and the angiographic catheter removed. A 9 French peel-away dilator was then advanced over the Super Stiff wire and passed into the  renal pelvis and across the UPJ under fluoroscopic guidance. The inner part of this dilator was removed and the 0.38 sensor wire was passed alongside the Super Stiff wire through the dilator and into the right ureter and down into the bladder. The angiographic catheter again was passed over the guidewire and advanced into the bladder, the wire was then removed. A Super Stiff wire was then passed through angiographic catheter and angiographic catheter removed. The outer part of the sheath was then removed, establishing 2 superstiff wires through the targeted calyx and into the bladder.   The 30 French NephroMax balloon was then passed over one of the Super Stiff wires and the tip guided down into the targeted calyx. The balloon was then inflated to approximately 12 atm, and once there was no waist noted under fluoroscopy the access sheath was advanced over the balloon. The balloon was then removed. The wires were then placed back into the sheaths and snapped to the drape.   Using the rigid nephroscope to explore the targeted calyx and kidney.  The stones were gold and hard in appearance. There was a single large stone in the lower pole/pelvis. Then using a flexible cystoscope to navigate the remaining calyces of the kidney multiple smaller stone fragments encountered.  Using the 0 tip basket these fragments were grabbed and removed. Contrast was injected through the cystoscope and the calyces systematically inspected under fluoroscopic guidance to ensure that all stone fragments had been removed. A small false passage was identified in edge of kidney.   Then using the flexible ureteroscope the ureter was navigated in antegrade fashion. There was evidence of stone in the ureter. Using a combination of the zero tip and encompass basket, the stone was removed. Most of the stone fragments were pushed from the ureter into the bladder. Once the ureter was clear the scope was advanced into the bladder and a 0.038  sensor wire was left in the bladder and the scope backed out over wire. The sensor wire was then backloaded over the rigid nephroscope using the stent pusher and a 24 cm x 6 French double-J ureteral stent was passed antegrade over the sensor wire down into the bladder under fluoroscopic guidance. Once the stent was in the bladder the wire was gently pulled back and a nice curl noted in the bladder. The wire completely removed from the stent, and nice curl on the proximal end of the stent was noted in the renal pelvis. The sheath was then backed out slowly to ensure that all calyces had been inspected and there was nothing behind the sheath.   A 62F ainsworth tip catheter was then passed over one of the Super Stiff wires through the sheath and into the renal pelvis. The sheath was then backed out of the kidney and cut over the red rubber catheter. A nephrostogram was then performed confirming the position of our nephrostomy tube and reassuring that there were no longer any filling defects from the patient's symptoms.  After several minutes of direct pressure and observation was noted that there was no significant bleeding from the nephrostomy tube or around the nephrostomy tube tract.  As such, I remove the nephrostomy tube as well as the safety wire. 25 cc of local anesthesia was then injected into the patient's wound, and the wound was closed with 3-0  nylon in 2 vertical mattress sutures. The incision was then padded using a bundle of 4 x 4's and Hypafix tape. Patient was subsequently rolled over to the supine position and extubated. The patient was returned to the PACU in excellent condition. At the end of the case all lap and needle and sponges were accounted for. There are no perioperative complications.    Plan: Continue ureteral stent Plan to perform second look ureteroscopy to remove distal stone burden.   Jerald Kief, MD, PhD [facility] Resident  [facility]

## 2023-04-29 NOTE — Discharge Instructions (Signed)
Discharge instructions following PCNL  Call your doctor for: Fevers greater than 100.5 Severe nausea or vomiting Increasing pain not controlled by pain medication Increasing redness or drainage from incisions Decreased urine output or a catheter is no longer draining  The number for questions is [phone].  Activity: Gradually increase activity with short frequent walks, 3-4 times a day.  Avoid strenuous activities, like sports, lawn-mowing, or heavy lifting (more than 10-15 pounds).  Wear loose, comfortable clothing that pull or kink the tube or tubes.  Do not drive while taking pain medication, or until your doctor permitts it.  Bathing and dressing changes: You should not shower for 48 hours after surgery.  Do not soak your back in a bathtub.  Drainage bag care: You may be discharged with a drainage bag around the site of your surgery.  The drainage bag should be secured such that it never pulls or loosens to prevent it from leaking.  It is important to wash her hands before and after emptying the drainage bag to help prevent the spread of infection.  The drainage bag should be emptied as needed.  When the wound stops draining or it is manageable with a dry gauze dressing, you can remove the bag.  Diet: It is extremely important to drink plenty of fluids after surgery, especially water.  You may resume your regular diet, unless otherwise instructed.  Medications: May take Tylenol (acetaminophen) or ibuprofen (Advil, Motrin) as directed over-the-counter. Take any prescriptions as directed.  Follow-up appointments: You will follow up in 2-3 weeks for your next surgery with urology as we discussed. We will call you with the date of the surgery.   ANTICOAGULATION:  Ok to restart Plavix and aspirin on 7/13

## 2023-04-29 NOTE — Interval H&P Note (Signed)
History and Physical Interval Note:  04/29/2023 8:39 AM  Leon Allen  has presented today for surgery, with the diagnosis of RIGHT RENAL CALCULI.  The various methods of treatment have been discussed with the patient and family. After consideration of risks, benefits and other options for treatment, the patient has consented to  Procedure(s) with comments: RIGHT NEPHROLITHOTOMY PERCUTANEOUS-SURGEON ACCESS (Right) - 210 MINUTES as a surgical intervention.  The patient's history has been reviewed, patient examined, no change in status, stable for surgery.  I have reviewed the patient's chart and labs.  Questions were answered to the patient's satisfaction.     Crist Fat

## 2023-04-29 NOTE — Anesthesia Procedure Notes (Signed)
Procedure Name: Intubation Date/Time: 04/29/2023 9:18 AM  Performed by: Deri Fuelling, CRNAPre-anesthesia Checklist: Patient identified, Emergency Drugs available, Suction available, Patient being monitored and Timeout performed Patient Re-evaluated:Patient Re-evaluated prior to induction Oxygen Delivery Method: Circle system utilized Preoxygenation: Pre-oxygenation with 100% oxygen Induction Type: IV induction Ventilation: Mask ventilation without difficulty Laryngoscope Size: Glidescope and 4 Grade View: Grade I Tube type: Oral Tube size: 7.5 mm Number of attempts: 1 Airway Equipment and Method: Stylet and Oral airway Placement Confirmation: ETT inserted through vocal cords under direct vision, positive ETCO2 and breath sounds checked- equal and bilateral Secured at: 22 cm Tube secured with: Tape Dental Injury: Teeth and Oropharynx as per pre-operative assessment

## 2023-04-30 ENCOUNTER — Other Ambulatory Visit: Payer: Self-pay

## 2023-04-30 ENCOUNTER — Encounter (HOSPITAL_COMMUNITY): Payer: Self-pay | Admitting: Urology

## 2023-04-30 DIAGNOSIS — Z955 Presence of coronary angioplasty implant and graft: Secondary | ICD-10-CM | POA: Diagnosis not present

## 2023-04-30 DIAGNOSIS — Z880 Allergy status to penicillin: Secondary | ICD-10-CM | POA: Diagnosis not present

## 2023-04-30 DIAGNOSIS — Z803 Family history of malignant neoplasm of breast: Secondary | ICD-10-CM | POA: Diagnosis not present

## 2023-04-30 DIAGNOSIS — Z91030 Bee allergy status: Secondary | ICD-10-CM | POA: Diagnosis not present

## 2023-04-30 DIAGNOSIS — N202 Calculus of kidney with calculus of ureter: Secondary | ICD-10-CM | POA: Diagnosis present

## 2023-04-30 DIAGNOSIS — Z807 Family history of other malignant neoplasms of lymphoid, hematopoietic and related tissues: Secondary | ICD-10-CM | POA: Diagnosis not present

## 2023-04-30 DIAGNOSIS — Z8673 Personal history of transient ischemic attack (TIA), and cerebral infarction without residual deficits: Secondary | ICD-10-CM | POA: Diagnosis not present

## 2023-04-30 DIAGNOSIS — Z87891 Personal history of nicotine dependence: Secondary | ICD-10-CM | POA: Diagnosis not present

## 2023-04-30 DIAGNOSIS — Z888 Allergy status to other drugs, medicaments and biological substances status: Secondary | ICD-10-CM | POA: Diagnosis not present

## 2023-04-30 DIAGNOSIS — Z7902 Long term (current) use of antithrombotics/antiplatelets: Secondary | ICD-10-CM | POA: Diagnosis not present

## 2023-04-30 DIAGNOSIS — I252 Old myocardial infarction: Secondary | ICD-10-CM | POA: Diagnosis not present

## 2023-04-30 DIAGNOSIS — I2510 Atherosclerotic heart disease of native coronary artery without angina pectoris: Secondary | ICD-10-CM | POA: Diagnosis present

## 2023-04-30 DIAGNOSIS — N200 Calculus of kidney: Secondary | ICD-10-CM | POA: Diagnosis present

## 2023-04-30 DIAGNOSIS — Z7982 Long term (current) use of aspirin: Secondary | ICD-10-CM | POA: Diagnosis not present

## 2023-04-30 DIAGNOSIS — K219 Gastro-esophageal reflux disease without esophagitis: Secondary | ICD-10-CM | POA: Diagnosis present

## 2023-04-30 DIAGNOSIS — Z87442 Personal history of urinary calculi: Secondary | ICD-10-CM | POA: Diagnosis not present

## 2023-04-30 DIAGNOSIS — Z79899 Other long term (current) drug therapy: Secondary | ICD-10-CM | POA: Diagnosis not present

## 2023-04-30 DIAGNOSIS — Z8249 Family history of ischemic heart disease and other diseases of the circulatory system: Secondary | ICD-10-CM | POA: Diagnosis not present

## 2023-04-30 DIAGNOSIS — Z981 Arthrodesis status: Secondary | ICD-10-CM | POA: Diagnosis not present

## 2023-04-30 LAB — BASIC METABOLIC PANEL
Anion gap: 9 (ref 5–15)
BUN: 18 mg/dL (ref 6–20)
CO2: 21 mmol/L — ABNORMAL LOW (ref 22–32)
Calcium: 7.7 mg/dL — ABNORMAL LOW (ref 8.9–10.3)
Chloride: 102 mmol/L (ref 98–111)
Creatinine, Ser: 1.29 mg/dL — ABNORMAL HIGH (ref 0.61–1.24)
GFR, Estimated: >60 mL/min (ref >60)
Glucose, Bld: 143 mg/dL — ABNORMAL HIGH (ref 70–99)
Potassium: 4.1 mmol/L (ref 3.5–5.1)
Sodium: 132 mmol/L — ABNORMAL LOW (ref 135–145)

## 2023-04-30 LAB — HEMOGLOBIN AND HEMATOCRIT, BLOOD
HCT: 39.7 % (ref 39.0–52.0)
Hemoglobin: 12.8 g/dL — ABNORMAL LOW (ref 13.0–17.0)

## 2023-04-30 LAB — SURGICAL PATHOLOGY

## 2023-04-30 MED ORDER — TRAMADOL HCL 50 MG PO TABS: 50.0000 mg | ORAL_TABLET | Freq: Four times a day (QID) | ORAL | 0 refills | Status: DC | PRN

## 2023-04-30 MED ORDER — OXYCODONE HCL 5 MG PO TABS
10.0000 mg | ORAL_TABLET | Freq: Four times a day (QID) | ORAL | Status: DC | PRN
  Administered 2023-04-30: 10 mg via ORAL
  Filled 2023-04-30: qty 2

## 2023-04-30 MED ORDER — ALBUTEROL SULFATE (2.5 MG/3ML) 0.083% IN NEBU
2.5000 mg | INHALATION_SOLUTION | Freq: Two times a day (BID) | RESPIRATORY_TRACT | Status: DC
  Administered 2023-04-30 – 2023-05-01 (×3): 2.5 mg via RESPIRATORY_TRACT
  Filled 2023-04-30 (×3): qty 3

## 2023-04-30 MED ORDER — OXYCODONE HCL 5 MG PO TABS
5.0000 mg | ORAL_TABLET | Freq: Four times a day (QID) | ORAL | Status: DC | PRN
  Administered 2023-05-01: 5 mg via ORAL
  Filled 2023-04-30: qty 1

## 2023-04-30 MED ORDER — OXYBUTYNIN CHLORIDE 5 MG PO TABS
5.0000 mg | ORAL_TABLET | Freq: Three times a day (TID) | ORAL | Status: DC | PRN
  Administered 2023-04-30 – 2023-05-01 (×2): 5 mg via ORAL
  Filled 2023-04-30 (×2): qty 1

## 2023-04-30 NOTE — Progress Notes (Signed)
   1 Day Post-Op Subjective: 7/11: Patient in severe pain on rounds. NAEON.   Objective: Vital signs in last 24 hours: Temp:  [97.8 F (36.6 C)-98.8 F (37.1 C)] 98.3 F (36.8 C) (07/11 0635) Pulse Rate:  [71-83] 83 (07/11 0635) Resp:  [6-22] 20 (07/11 0635) BP: (120-154)/(76-114) 134/86 (07/11 0635) SpO2:  [83 %-99 %] 95 % (07/11 0832)  Intake/Output from previous day: 07/10 0701 - 07/11 0700 In: 2796.8 [P.O.:480; I.V.:1816.8; IV Piggyback:500] Out: 1826 [Urine:1750; Emesis/NG output:1; Blood:75]  Intake/Output this shift: No intake/output data recorded.  Physical Exam:  General: Alert and oriented CV: No cyanosis Lungs: equal chest rise Abdomen: Soft, NTND, no rebound or guarding Skin: PCNL site C/D/I  Lab Results: Recent Labs    04/29/23 1548 04/30/23 0512  HGB 13.8 12.8*  HCT 43.1 39.7   BMET Recent Labs    04/29/23 1548 04/30/23 0512  NA 137 132*  K 4.2 4.1  CL 108 102  CO2 22 21*  GLUCOSE 175* 143*  BUN 19 18  CREATININE 0.89 1.29*  CALCIUM 7.4* 7.7*     Studies/Results: DG C-Arm 1-60 Min-No Report  Result Date: 04/29/2023 Fluoroscopy was utilized by the requesting physician.  No radiographic interpretation.   DG C-Arm 1-60 Min-No Report  Result Date: 04/29/2023 Fluoroscopy was utilized by the requesting physician.  No radiographic interpretation.   DG C-Arm 1-60 Min-No Report  Result Date: 04/29/2023 Fluoroscopy was utilized by the requesting physician.  No radiographic interpretation.   DG C-Arm 1-60 Min-No Report  Result Date: 04/29/2023 Fluoroscopy was utilized by the requesting physician.  No radiographic interpretation.   DG C-Arm 1-60 Min-No Report  Result Date: 04/29/2023 Fluoroscopy was utilized by the requesting physician.  No radiographic interpretation.    Assessment/Plan: # Right nephrolithiasis # Mild AKI  S/p right side percutaneous nephrolithotomy 7/11. Trend daily labs IV fluids off Had hoped to discharge  patient home today as he is doing well from medical perspective.  Unfortunately he is still struggling with a significant amount of postoperative pain.  Will plan to keep him stable in hospital overnight evaluate in the morning.   LOS: 0 days   Elmon Kirschner, NP [facility] Pager: [phone]  04/30/2023, 9:16 AM

## 2023-04-30 NOTE — Discharge Summary (Signed)
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 08/20/2022   Screening for diabetes mellitus (DM) 08/20/2022   Unstable angina (HCC) 08/13/2022   Essential hypertension 08/08/2022   Erectile dysfunction 03/03/2016   Coronary artery disease 03/03/2016   Chest pain 01/17/2016   Hematuria 01/17/2016   Syncope 01/17/2016   Neck pain 01/17/2016   Nephrolithiasis 01/17/2016   History of tobacco abuse 01/17/2016   Adrenal adenoma 01/17/2016   History of CVA (cerebrovascular accident) 01/17/2016   Cholelithiasis 01/17/2016   Pulmonary nodules 01/17/2016   Diverticulosis 01/17/2016   Marijuana use 01/17/2016    Procedures performed: Procedure(s): RIGHT NEPHROLITHOTOMY PERCUTANEOUS-SURGEON ACCESS  History and Physical: For full details, please see admission history and physical. Briefly, Leon Allen is a 57 y.o. year old patient with >3 cm stone burden who presents for PCNL.   Hospital Course: Patient tolerated the procedure well.  He was then transferred to the floor after an uneventful PACU stay.  His hospital course was uncomplicated.  On POD#1 he had met discharge criteria: was eating a regular diet, was up and ambulating independently,  pain was well controlled, was voiding without a catheter, and was ready to for discharge.   Laboratory values:  Recent Labs    04/29/23 1548 04/30/23 0512  HGB 13.8 12.8*  HCT 43.1 39.7   Recent Labs    04/29/23 1548 04/30/23 0512  NA 137 132*  K 4.2 4.1  CL 108 102  CO2 22  21*  GLUCOSE 175* 143*  BUN 19 18  CREATININE 0.89 1.29*  CALCIUM 7.4* 7.7*   No results for input(s): "LABPT", "INR" in the last 72 hours. No results for input(s): "LABURIN" in the last 72 hours. Results for orders placed or performed during the hospital encounter of 03/10/23  Surgical pcr screen     Status: None   Collection Time: 03/09/23  1:31 PM   Specimen: Nasal Mucosa; Nasal Swab  Result Value Ref Range Status   MRSA, PCR NEGATIVE NEGATIVE Final   Staphylococcus aureus NEGATIVE NEGATIVE Final    Comment: (NOTE) The Xpert SA Assay (FDA approved for NASAL specimens in patients 34 years of age and older), is one component of a comprehensive surveillance program. It is not intended to diagnose infection nor to guide or monitor treatment. Performed at [facility], [address] [place], [place] [address]    Physical exam: Gen: NAD Resp: Satting well on RA Cards: Regular rate Abd: Soft, NT, ND GU: Voiding spontaeously, Mild right CVA tenderness Neuro: Alert   Disposition: Home  Discharge instruction: The patient was instructed to be ambulatory but told to refrain from heavy lifting, strenuous activity, or driving.   Discharge medications:  Allergies as of 05/01/2023       Reactions   Atorvastatin Other (See Comments)   Chest pressure   Bee Venom Swelling   Penicillins Other (See Comments)   Childhood Allergy         Medication List     TAKE these medications    albuterol 108 (90 Base) MCG/ACT inhaler Commonly  known as: VENTOLIN HFA Inhale 2 puffs into the lungs every 6 (six) hours as needed for wheezing or shortness of breath.   ascorbic acid 500 MG tablet Commonly known as: VITAMIN C Take 1,500 mg by mouth daily.   aspirin 81 MG chewable tablet Chew 243 mg by mouth daily. Swallow whole.   clopidogrel 75 MG tablet Commonly known as: PLAVIX Take 75 mg by mouth daily.   isosorbide mononitrate 60 MG 24 hr tablet Commonly known as:  IMDUR Take 60 mg by mouth daily.   metoprolol succinate 25 MG 24 hr tablet Commonly known as: TOPROL-XL Take 1 tablet (25 mg total) by mouth daily.   nitroGLYCERIN 0.4 MG SL tablet Commonly known as: NITROSTAT Place 1 tablet (0.4 mg total) under the tongue every 5 (five) minutes as needed for chest pain.   pantoprazole 20 MG tablet Commonly known as: PROTONIX TAKE 1 TABLET BY MOUTH EVERY DAY   rosuvastatin 5 MG tablet Commonly known as: CRESTOR Take 1 tablet (5 mg total) by mouth daily.   traMADol 50 MG tablet Commonly known as: Ultram Take 1-2 tablets (50-100 mg total) by mouth every 6 (six) hours as needed for moderate pain.   vitamin B-12 500 MCG tablet Commonly known as: CYANOCOBALAMIN Take 2,000 mcg by mouth daily.        Followup:   Follow-up Information     Matthew[place], [place], MD Follow up.   Why: You will follow up in 2-3 weeks for stone surgery. Contact information: [address] [place] [address] [phone]

## 2023-04-30 NOTE — TOC CM/SW Note (Addendum)
Transition of Care [facility]) - Inpatient Brief Assessment   Patient Details  Name: Leon Allen MRN: [id number] Date of Birth: February 04, 1966  Transition of Care [facility]) CM/SW Contact:    Howell Rucks, RN Phone Number: 04/30/2023, 11:04 AM   Clinical Narrative: Met with pt at bedside to introduce role of TOC/NCM and review for dc planning. Pt reports he has a PCP and pharmacy in place, no home care services or home DME, pt confirmed he has transportation available at discharge. No insurance place listed for pt, pt  reports he is in the proces of apply for Medicaid. Brief Assessment completed. No TOC needs identified.     Transition of Care Asessment: Insurance and Status: Insurance coverage has been reviewed (Pt confirmed he is in the proces of applying for disability) Patient has primary care physician: Yes Home environment has been reviewed: Home Prior level of function:: Independent Prior/Current Home Services: No current home services Social Determinants of Health Reivew: SDOH reviewed no interventions necessary Readmission risk has been reviewed: Yes Transition of care needs: no transition of care needs at this time

## 2023-05-01 ENCOUNTER — Other Ambulatory Visit: Payer: Self-pay | Admitting: Urology

## 2023-05-01 NOTE — Plan of Care (Signed)
  Problem: Education: Goal: Knowledge of General Education information will improve Description Including pain rating scale, medication(s)/side effects and non-pharmacologic comfort measures Outcome: Progressing   Problem: Health Behavior/Discharge Planning: Goal: Ability to manage health-related needs will improve Outcome: Progressing   

## 2023-05-01 NOTE — Plan of Care (Signed)

## 2023-05-04 ENCOUNTER — Other Ambulatory Visit: Payer: Self-pay

## 2023-05-04 ENCOUNTER — Telehealth: Payer: Self-pay | Admitting: Internal Medicine

## 2023-05-04 ENCOUNTER — Telehealth: Payer: Self-pay

## 2023-05-04 ENCOUNTER — Encounter (HOSPITAL_COMMUNITY): Payer: Self-pay | Admitting: Urology

## 2023-05-04 ENCOUNTER — Other Ambulatory Visit: Payer: Self-pay | Admitting: Urology

## 2023-05-04 NOTE — Telephone Encounter (Signed)
Spoke with patient who reports some swelling in legs following since having a procedure for kidney stones on 7/10.  He has not been weighing.  He had a renal stent placed and is scheduled for a follow up procedure on 7/29.  Provided education on monitoring weight for fluid retention and restrict sodium intake. Advised to notify surgeon that he is having lower extremity swelling following his procedure.  Patient verbalized understanding and had no questions.

## 2023-05-04 NOTE — Progress Notes (Addendum)
Anesthesia Review:  PCP:  [name] LOV 04/24/23  Cardiologist : Earlean Polka Swinyer LOV 02/26/23  Chest x-ray :  CT Chest- 04/23/23  EKG : 02/26/23  Echo : 2017  Carotids- 2017  Stress test: 2017  Cardiac Cath : 08/13/22   Activity level: can do a flgiht of stairs without difficulty  Sleep Study/ CPAP : none  Fasting Blood Sugar :      / Checks Blood Sugar -- times a day:   Blood Thinner/ Instructions /Last Dose: ASA / Instructions/ Last Dose :    ASA- 81 mg  Plavix-  stop 5 days prior per pt    CBC and BMP done on 04/30/23  Discharged on 04/29/2023 - right nephrolithotomy    Hx of recent cervical surgery - 03/10/23    OCC uses marijuana  Completed med hx via phone on 05/04/23.  To complete instructions late afternoon of 05/04/23.  Instructions completed via epic on 05/04/23.     Pt reports at preop phone call of 05/04/23 that " my ankles and feet are starting to swell.".  Instructed pt to call cardiology and DR Laverle Patter office today and let them be aware.  PT voiced understanding and stated he would call today.

## 2023-05-04 NOTE — Transitions of Care (Post Inpatient/ED Visit) (Signed)
05/04/2023  Name: Leon Allen MRN: [id number] DOB: 1966/04/16  Today's TOC FU Call Status: Today's TOC FU Call Status:: Successful TOC FU Call Competed TOC FU Call Complete Date: 05/04/23  Transition Care Management Follow-up Telephone Call Date of Discharge: 05/01/23 Discharge Facility: [facility]) Type of Discharge: Inpatient Admission Primary Inpatient Discharge Diagnosis:: kidney stone How have you been since you were released from the hospital?: Better Any questions or concerns?: No  Items Reviewed: Did you receive and understand the discharge instructions provided?: Yes Medications obtained,verified, and reconciled?: Yes (Medications Reviewed) Any new allergies since your discharge?: No Dietary orders reviewed?: Yes Do you have support at home?: No  Medications Reviewed Today: Medications Reviewed Today     Reviewed by Karena Addison, LPN (Licensed Practical Nurse) on 05/04/23 at 1102  Med List Status: <None>   Medication Order Taking? Sig Documenting Provider Last Dose Status Informant  albuterol (VENTOLIN HFA) 108 (90 Base) MCG/ACT inhaler [id number] No Inhale 2 puffs into the lungs every 6 (six) hours as needed for wheezing or shortness of breath. [name], DO Past Week Active   ascorbic acid (VITAMIN C) 500 MG tablet [id number] No Take 1,500 mg by mouth daily. [provider] Past Month Active Self  aspirin 81 MG chewable tablet [id number] No Chew 243 mg by mouth daily. Swallow whole. [provider] Past Week Active Self  clopidogrel (PLAVIX) 75 MG tablet [id number] No Take 75 mg by mouth daily. [provider] 04/24/2023 Active Self  isosorbide mononitrate (IMDUR) 60 MG 24 hr tablet [id number] No Take 60 mg by mouth daily. [provider] Past Week Active Self  metoprolol succinate (TOPROL-XL) 25 MG 24 hr tablet [id number] No Take 1 tablet (25 mg total) by mouth daily. Christell Constant, MD 04/24/2023 Active Self   nitroGLYCERIN (NITROSTAT) 0.4 MG SL tablet [id number] No Place 1 tablet (0.4 mg total) under the tongue every 5 (five) minutes as needed for chest pain. Christell Constant, MD Taking Active Self           Med Note Lisbeth Ply   Wed Apr 29, 2023  7:23 AM) On hand at home  pantoprazole (PROTONIX) 20 MG tablet [id number] No TAKE 1 TABLET BY MOUTH EVERY DAY Carlos Levering, NP 04/27/2023 Active Self  rosuvastatin (CRESTOR) 5 MG tablet [id number] No Take 1 tablet (5 mg total) by mouth daily. Christell Constant, MD Past Week Active Self  traMADol (ULTRAM) 50 MG tablet [id number]  Take 1-2 tablets (50-100 mg total) by mouth every 6 (six) hours as needed for moderate pain. Crist Fat, MD  Active   vitamin B-12 (CYANOCOBALAMIN) 500 MCG tablet [id number] No Take 2,000 mcg by mouth daily. [provider] Past Week Active Self            Home Care and Equipment/Supplies: Were Home Health Services Ordered?: NA Any new equipment or medical supplies ordered?: NA  Functional Questionnaire: Do you need assistance with bathing/showering or dressing?: No Do you need assistance with meal preparation?: No Do you need assistance with eating?: No Do you have difficulty maintaining continence: No Do you need assistance with getting out of bed/getting out of a chair/moving?: No Do you have difficulty managing or taking your medications?: No  Follow up appointments reviewed: PCP Follow-up appointment confirmed?: NA Specialist Hospital Follow-up appointment confirmed?: Yes Date of Specialist follow-up appointment?: 05/05/23 Follow-Up Specialty Provider:: surgeon Do you need transportation to your follow-up appointment?: No Do you understand care options if your condition(s)  worsen?: Yes-patient verbalized understanding    SIGNATURE Karena Addison, LPN [facility] Nurse Health Advisor Direct Dial [phone]

## 2023-05-04 NOTE — Telephone Encounter (Signed)
Pt c/o swelling: STAT is pt has developed SOB within 24 hours  If swelling, where is the swelling located? Ankles and feet  How much weight have you gained and in what time span? NA  Have you gained 3 pounds in a day or 5 pounds in a week? No  Do you have a log of your daily weights (if so, list)? No  Are you currently taking a fluid pill? No  Are you currently SOB? NO  Have you traveled recently? No  Pt states that since having kidney stone removed on 7/10 he has been having swelling since 7/12. He would ike a callback regarding this matter. Please advise

## 2023-05-05 ENCOUNTER — Other Ambulatory Visit: Payer: Self-pay

## 2023-05-05 NOTE — Patient Outreach (Signed)
Medicaid Managed Care Social Work Note  05/05/2023 Name:  Leon Allen MRN:  [id number] DOB:  01/07/1966  Leon Allen is an 57 y.o. year old male who is a primary patient of [name], [place].  The [facility] Managed Care Coordination team was consulted for assistance with:  Community Resources   Mr. Dhaliwal was given information about Medicaid Managed Care Coordination team services today. [name] Patient agreed to services and verbal consent obtained.  Engaged with patient  for by telephone forinitial visit in response to referral for case management and/or care coordination services.   Assessments/Interventions:  Review of past medical history, allergies, medications, health status, including review of consultants reports, laboratory and other test data, was performed as part of comprehensive evaluation and provision of chronic care management services.  SDOH: (Social Determinant of Health) assessments and interventions performed: SDOH Interventions    Flowsheet Row Telephone from 07/07/2022 in [facility]  SDOH Interventions   Food Insecurity Interventions Other (Comment)  [SNAP FNS card from [employer]]  Housing Interventions Intervention Not Indicated  [pt sister assists w/ costs of living]  Transportation Interventions Intervention Not Indicated  Utilities Interventions Intervention Not Indicated, Other (Comment)  [pt sisters assists with costs of living]  Financial Strain Interventions Other (Comment)  [pt family assists currently with costs of living,  mailed [address] application, SNAP card, CAFA and [place] Card]     BSW completed a telephone outreach with patient, he states he has been out of work for 3 months, he is self employed, states it can be about 2 months before he can go back to work. He has applied for disability and received a letter from [facility]. Patient states he pays 600 in rent each month but his landlord  just informed him he has 5 months to move. BSW will send patient resources for rent rent and utilities.   Advanced Directives Status:  Not addressed in this encounter.  Care Plan                 Allergies  Allergen Reactions   Atorvastatin Other (See Comments)    Chest pressure    Bee Venom Swelling   Penicillins Other (See Comments)    Childhood Allergy     Medications Reviewed Today     Reviewed by Karena Addison, LPN (Licensed Practical Nurse) on 05/04/23 at 1102  Med List Status: <None>   Medication Order Taking? Sig Documenting Provider Last Dose Status Informant  albuterol (VENTOLIN HFA) 108 (90 Base) MCG/ACT inhaler [id number] No Inhale 2 puffs into the lungs every 6 (six) hours as needed for wheezing or shortness of breath. [name], DO Past Week Active   ascorbic acid (VITAMIN C) 500 MG tablet [id number] No Take 1,500 mg by mouth daily. [provider] Past Month Active Self  aspirin 81 MG chewable tablet [id number] No Chew 243 mg by mouth daily. Swallow whole. [provider] Past Week Active Self  clopidogrel (PLAVIX) 75 MG tablet [id number] No Take 75 mg by mouth daily. [provider] 04/24/2023 Active Self  isosorbide mononitrate (IMDUR) 60 MG 24 hr tablet [id number] No Take 60 mg by mouth daily. [provider] Past Week Active Self  metoprolol succinate (TOPROL-XL) 25 MG 24 hr tablet [id number] No Take 1 tablet (25 mg total) by mouth daily. Christell Constant, MD 04/24/2023 Active Self  nitroGLYCERIN (NITROSTAT) 0.4 MG SL tablet [id number] No Place 1 tablet (0.4 mg total) under the  tongue every 5 (five) minutes as needed for chest pain. Christell Constant, MD Taking Active Self           Med Note Lisbeth Ply   Wed Apr 29, 2023  7:23 AM) On hand at home  pantoprazole (PROTONIX) 20 MG tablet [id number] No TAKE 1 TABLET BY MOUTH EVERY DAY Carlos Levering, NP 04/27/2023 Active Self  rosuvastatin (CRESTOR) 5 MG tablet [id number]  No Take 1 tablet (5 mg total) by mouth daily. Christell Constant, MD Past Week Active Self  traMADol (ULTRAM) 50 MG tablet [id number]  Take 1-2 tablets (50-100 mg total) by mouth every 6 (six) hours as needed for moderate pain. Crist Fat, MD  Active   vitamin B-12 (CYANOCOBALAMIN) 500 MCG tablet [id number] No Take 2,000 mcg by mouth daily. [provider] Past Week Active Self            Patient Active Problem List   Diagnosis Date Noted   Renal calculus 04/29/2023   Chronic obstructive pulmonary disease (HCC) 04/24/2023   Incidental pulmonary nodule, > 3mm and < 8mm 03/26/2023   Hyperlipidemia 03/26/2023   HNP (herniated nucleus pulposus), cervical 03/10/2023   Hospital discharge follow-up 08/20/2022   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 08/20/2022   Screening for diabetes mellitus (DM) 08/20/2022   Unstable angina (HCC) 08/13/2022   Essential hypertension 08/08/2022   Erectile dysfunction 03/03/2016   Coronary artery disease 03/03/2016   Chest pain 01/17/2016   Hematuria 01/17/2016   Syncope 01/17/2016   Neck pain 01/17/2016   Nephrolithiasis 01/17/2016   History of tobacco abuse 01/17/2016   Adrenal adenoma 01/17/2016   History of CVA (cerebrovascular accident) 01/17/2016   Cholelithiasis 01/17/2016   Pulmonary nodules 01/17/2016   Diverticulosis 01/17/2016   Marijuana use 01/17/2016    Conditions to be addressed/monitored per PCP order:   community resources  There are no care plans that you recently modified to display for this patient.   Follow up:  Patient agrees to Care Plan and Follow-up.  Plan: The Managed Medicaid care management team will reach out to the patient again over the next 15 days.  Date/time of next scheduled Social Work care management/care coordination outreach:  05/26/23  Gus Puma, Kenard Gower, [facility]  Managed [facility] Social Worker [phone]

## 2023-05-05 NOTE — Patient Instructions (Signed)
Visit Information  The Patient                                              was given information about Medicaid Managed Care team care coordination services and consented to engagement with the Medicaid Managed Care team.   Social Worker will follow up in 15 days.   Alexis Shields, BSW, MHA [place]  Managed Medicaid Social Worker [phone]  

## 2023-05-09 LAB — CALCULI, WITH PHOTOGRAPH (CLINICAL LAB)
Calcium Oxalate Dihydrate: 70 %
Calcium Oxalate Monohydrate: 30 %
Weight Calculi: 558 mg

## 2023-05-13 ENCOUNTER — Encounter (HOSPITAL_COMMUNITY)
Admission: RE | Admit: 2023-05-13 | Discharge: 2023-05-13 | Disposition: A | Discharge status: Home / Self Care | Payer: Medicaid Other | Source: Ambulatory Visit | Attending: Urology | Admitting: Urology

## 2023-05-13 VITALS — BP 128/90 | HR 83 | Temp 98.1°F | Resp 18

## 2023-05-13 DIAGNOSIS — Z01812 Encounter for preprocedural laboratory examination: Secondary | ICD-10-CM | POA: Insufficient documentation

## 2023-05-13 DIAGNOSIS — Z01818 Encounter for other preprocedural examination: Secondary | ICD-10-CM

## 2023-05-13 LAB — BASIC METABOLIC PANEL
Anion gap: 10 (ref 5–15)
BUN: 27 mg/dL — ABNORMAL HIGH (ref 6–20)
CO2: 23 mmol/L (ref 22–32)
Calcium: 9 mg/dL (ref 8.9–10.3)
Chloride: 106 mmol/L (ref 98–111)
Creatinine, Ser: 0.81 mg/dL (ref 0.61–1.24)
GFR, Estimated: >60 mL/min (ref >60)
Glucose, Bld: 107 mg/dL — ABNORMAL HIGH (ref 70–99)
Potassium: 3.7 mmol/L (ref 3.5–5.1)
Sodium: 139 mmol/L (ref 135–145)

## 2023-05-13 LAB — CBC
HCT: 35.1 % — ABNORMAL LOW (ref 39.0–52.0)
Hemoglobin: 11.2 g/dL — ABNORMAL LOW (ref 13.0–17.0)
MCH: 29 pg (ref 26.0–34.0)
MCHC: 31.9 g/dL (ref 30.0–36.0)
MCV: 90.9 fL (ref 80.0–100.0)
Platelets: 445 10*3/uL — ABNORMAL HIGH (ref 150–400)
RBC: 3.86 MIL/uL — ABNORMAL LOW (ref 4.22–5.81)
RDW: 13.1 % (ref 11.5–15.5)
WBC: 9.6 10*3/uL (ref 4.0–10.5)
nRBC: 0 % (ref 0.0–0.2)

## 2023-05-13 NOTE — Progress Notes (Signed)
PT came in for preop labs to include urine culture on 05/13/23.  Pt states he as a " numb spot on right buttock area since surgery" .  Informed pt to inform MD on appt on 05/14/23.  PT voiced understanding.  Pt also wanted to know if surgery was on video.  Informed pt to ask surgeon at next appt.  Labs completed.

## 2023-05-14 LAB — URINE CULTURE: Culture: NO GROWTH

## 2023-05-15 NOTE — H&P (Signed)
Office Visit Report     05/14/2023   --------------------------------------------------------------------------------   Leon Allen  MRN: [id number]  DOB: Jun 24, 1966, 57 year old Male  SSN: [id number]   PRIMARY CARE:     REFERRING:    PROVIDER:  Berniece Salines, M.D.  SUPERVISING:  Rhoderick Moody, M.D.  TREATING:  Leon Allen  LOCATION:  Client - [facility] [phone]     --------------------------------------------------------------------------------   CC/HPI: CC: post op pain   58 y.o male with h/o nephrolithiasis now s/p R PCNL on 7/10 with right stent placement. He is scheduled for second look ureteroscopy on 05/18/23. Presents today to discuss gluteal pain he has been having since surgery.   He presents today for post op check. He had some numbness and tingling in the left outer buttocks and feels like he hasn't been stooling the same. He is having bowel movements but states it is very soft. Not having to force it out. Feels the numbness in his butt 24/7. He stopped taking his B12 that he takes for neuropathy. He does have a history of sciatic for which he see his primary care for but does not have shooting pain down his leg. No evidence of saddle anesthesia.   He is still able to urinate. Passing stone fragments. Urine thin.   Not currently taking any narcotics. He is taking Tylenol and his pain is well controlled.   Currently off his plavix until after surgery.     ALLERGIES: Penicillins    MEDICATIONS: Plavix 75 mg tablet 1 tablet PO Daily  Tramadol Hcl 50 mg tablet 1-2 tablet PO Q 6 H PRN  Aspirin Ec 81 mg tablet, delayed release Oral  Fish Oil CAPS Oral  Isosorbide Mononitrate Er 60 mg tablet, extended release 24 hr  Metoprolol Succinate 25 mg tablet, extended release 24 hr 1 tablet PO Daily  Nitroglycerin 0.4 mg tablet, sublingual  Pantoprazole Sodium  Rosuvastatin Calcium  Vitamin B-12 500 mcg tablet Oral     GU PSH: No  GU PSH      PSH Notes: No Surgical Problems   NON-GU PSH: No Non-GU PSH    GU PMH: Gross hematuria - 02/26/2023, - 2018 Renal calculus - 02/26/2023, - 02/12/2023, Calcium nephrolithiasis, - 2017 Encounter for Prostate Cancer screening (Stable) - 02/12/2023, Screening PSA (prostate specific antigen), - 2017 Benign Neo Lft adrenal gland - 2018 Benign Neo Rt adrenal gland - 2018 ED due to arterial insufficiency - 2018    NON-GU PMH: Bacteriuria - 02/12/2023 Encounter for general adult medical examination without abnormal findings, Encounter for preventive health examination - 2017 Myocardial Infarction, History of myocardial infarction - 2017    FAMILY HISTORY: malignant neoplasm - Runs In Family   SOCIAL HISTORY: Marital Status: Single Preferred Language: English; Race: White Current Smoking Status: Patient does not smoke anymore.   Tobacco Use Assessment Completed: Used Tobacco in last 30 days? Patient uses recreational drugs. Uses marijuana. Drinks 1 caffeinated drink per day.     Notes: Former smoker, One child, Caffeine use, Alcohol use   REVIEW OF SYSTEMS:    GU Review Male:   Patient denies frequent urination, hard to postpone urination, burning/ pain with urination, get up at night to urinate, leakage of urine, stream starts and stops, trouble starting your stream, have to strain to urinate , erection problems, and penile pain.  Gastrointestinal (Upper):   Patient denies nausea, vomiting, and indigestion/ heartburn.  Gastrointestinal (Lower):   Patient denies diarrhea and constipation.  Constitutional:   Patient denies fever, night sweats, weight loss, and fatigue.  Skin:   Patient denies skin rash/ lesion and itching.  Eyes:   Patient denies blurred vision and double vision.  Ears/ Nose/ Throat:   Patient denies sore throat and sinus problems.  Hematologic/Lymphatic:   Patient denies swollen glands and easy bruising.  Cardiovascular:   Patient denies leg swelling and chest  pains.  Respiratory:   Patient denies cough and shortness of breath.  Endocrine:   Patient denies excessive thirst.  Musculoskeletal:   Patient denies back pain and joint pain.  Neurological:   Patient denies headaches and dizziness.  Psychologic:   Patient denies depression and anxiety.   VITAL SIGNS:      05/14/2023 12:32 PM  Weight 166 lb / 75.3 kg  Height 70 in / 177.8 cm  Pulse 74 /min  Temperature 97.3 F / 36.2 C  BMI 23.8 kg/m   MULTI-SYSTEM PHYSICAL EXAMINATION:      Notes: Gen: NAD  Resp: Satting well on RA  GU: voiding spontaneously. PCNL site on the right flank well healing. Sutures removed today.  Neuro: Small 3 cm area of numbness in the left buttocks but still has sensation. ambulate well.      Complexity of Data:  Source Of History:  Patient  Records Review:   Previous Hospital Records  Urine Test Review:   Urinalysis   02/12/23  PSA  Total PSA 0.61 ng/mL    PROCEDURES:          Urinalysis w/Scope - 81001 Dipstick Dipstick Cont'd Micro  Color: Brown Bilirubin: Neg WBC/hpf: 10 - 20/hpf  Appearance: Turbid Ketones: Neg RBC/hpf: 20 - 40/hpf  Specific Gravity: 1.030 Blood: 3+ Bacteria: Few (10-25/hpf)  pH: 5.5 Protein: 3+ Cystals: NS (Not Seen)  Glucose: Neg Urobilinogen: 0.2 Casts: NS (Not Seen)    Nitrites: Neg Trichomonas: Not Present    Leukocyte Esterase: 2+ Mucous: Present      Epithelial Cells: 0 - 5/hpf      Yeast: NS (Not Seen)      Sperm: Not Present    Notes:  RTEs noted     ASSESSMENT:      ICD-10 Details  1 GU:   Renal calculus - N20.0    PLAN:           Orders Labs CULTURE, URINE          Document Letter(s):  Created for Patient: Clinical Summary         Notes:   #Post op Pain  Buttocks pain in one spot in the left buttocks. Still able to feel sensation there. Has a history of sciatica.  -We will see if improved with stent removal. However, recommended pt see his primary care doctors. Very low likelihood of being related  to positioning after PCNL given prone position.   #PCNL sutures removed from prior perc site.  -Well healed.   #Nephrolithaisis  plan for second look ureteroscopy on 7/29.  UA consistent with stent with few bacteria and WBC.  Plan to send Ucx today in anticipatiion of procedure on 7/29.  -discussed preop instructions.  -Still holding plavix and ASA81   Supervised by Dr. Liliane Shi    * Signed by Leon Allen on 05/14/23 at 1:06 PM (EDT)*

## 2023-05-16 NOTE — Anesthesia Preprocedure Evaluation (Signed)
Anesthesia Evaluation    Reviewed: Allergy & Precautions, Patient's Chart, lab work & pertinent test results  History of Anesthesia Complications (+) history of anesthetic complications  Airway Mallampati: II  TM Distance: >3 FB Neck ROM: Full    Dental   Pulmonary COPD, former smoker   breath sounds clear to auscultation       Cardiovascular hypertension, Pt. on medications and Pt. on home beta blockers + angina  + CAD, + Past MI and + Cardiac Stents  + Valvular Problems/Murmurs  Rhythm:Regular Rate:Normal     Neuro/Psych  Neuromuscular disease CVA    GI/Hepatic Neg liver ROS,GERD  ,,  Endo/Other  negative endocrine ROS    Renal/GU Renal disease     Musculoskeletal   Abdominal   Peds  Hematology negative hematology ROS (+)   Anesthesia Other Findings   Reproductive/Obstetrics                             Anesthesia Physical Anesthesia Plan  ASA: 3  Anesthesia Plan: General   Post-op Pain Management: Tylenol PO (pre-op)*   Induction: Intravenous  PONV Risk Score and Plan: 2 and Dexamethasone, Ondansetron, Midazolam and Treatment may vary due to age or medical condition  Airway Management Planned: Oral ETT and Video Laryngoscope Planned  Additional Equipment: None  Intra-op Plan:   Post-operative Plan: Extubation in OR  Informed Consent: I have reviewed the patients History and Physical, chart, labs and discussed the procedure including the risks, benefits and alternatives for the proposed anesthesia with the patient or authorized representative who has indicated his/her understanding and acceptance.     Dental advisory given  Plan Discussed with: CRNA  Anesthesia Plan Comments: (Cath 10/23   Mid LAD lesion is 90% stenosed.   Prox RCA lesion is 30% stenosed.   Prox Cx to Mid Cx lesion is 30% stenosed.   A drug-eluting stent was successfully placed using a SYNERGY XD  3.50X20.   Post intervention, there is a 0% residual stenosis.   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  Successful PTCA/DES x 1 mid LAD covering the entire old stented segment Mild non-obstructive disease in the proximal RCA and mid Circumflex Mild hypokinesis of the anterior wall and apex with overall preserved LV systolic function )       Anesthesia Quick Evaluation

## 2023-05-18 ENCOUNTER — Encounter (HOSPITAL_COMMUNITY)
Admission: RE | Disposition: A | Discharge status: Home / Self Care | Payer: Self-pay | Source: Home / Self Care | Attending: Urology

## 2023-05-18 ENCOUNTER — Ambulatory Visit (HOSPITAL_COMMUNITY): Payer: Medicaid Other | Admitting: Physician Assistant

## 2023-05-18 ENCOUNTER — Encounter (HOSPITAL_COMMUNITY): Payer: Self-pay | Admitting: Urology

## 2023-05-18 ENCOUNTER — Ambulatory Visit (HOSPITAL_COMMUNITY): Payer: Medicaid Other

## 2023-05-18 ENCOUNTER — Other Ambulatory Visit: Payer: Self-pay

## 2023-05-18 ENCOUNTER — Ambulatory Visit (HOSPITAL_COMMUNITY)
Admission: RE | Admit: 2023-05-18 | Discharge: 2023-05-18 | Disposition: A | Discharge status: Home / Self Care | Payer: Medicaid Other | Attending: Urology | Admitting: Urology

## 2023-05-18 ENCOUNTER — Ambulatory Visit (HOSPITAL_BASED_OUTPATIENT_CLINIC_OR_DEPARTMENT_OTHER): Payer: Medicaid Other | Admitting: Anesthesiology

## 2023-05-18 DIAGNOSIS — Z7902 Long term (current) use of antithrombotics/antiplatelets: Secondary | ICD-10-CM | POA: Diagnosis not present

## 2023-05-18 DIAGNOSIS — I10 Essential (primary) hypertension: Secondary | ICD-10-CM

## 2023-05-18 DIAGNOSIS — Z955 Presence of coronary angioplasty implant and graft: Secondary | ICD-10-CM | POA: Diagnosis not present

## 2023-05-18 DIAGNOSIS — J449 Chronic obstructive pulmonary disease, unspecified: Secondary | ICD-10-CM | POA: Diagnosis not present

## 2023-05-18 DIAGNOSIS — N200 Calculus of kidney: Secondary | ICD-10-CM

## 2023-05-18 DIAGNOSIS — Z87891 Personal history of nicotine dependence: Secondary | ICD-10-CM | POA: Insufficient documentation

## 2023-05-18 DIAGNOSIS — N201 Calculus of ureter: Secondary | ICD-10-CM | POA: Diagnosis not present

## 2023-05-18 DIAGNOSIS — Z8673 Personal history of transient ischemic attack (TIA), and cerebral infarction without residual deficits: Secondary | ICD-10-CM | POA: Diagnosis not present

## 2023-05-18 DIAGNOSIS — G629 Polyneuropathy, unspecified: Secondary | ICD-10-CM | POA: Insufficient documentation

## 2023-05-18 DIAGNOSIS — N202 Calculus of kidney with calculus of ureter: Secondary | ICD-10-CM

## 2023-05-18 DIAGNOSIS — Z960 Presence of urogenital implants: Secondary | ICD-10-CM | POA: Diagnosis not present

## 2023-05-18 DIAGNOSIS — I252 Old myocardial infarction: Secondary | ICD-10-CM | POA: Insufficient documentation

## 2023-05-18 DIAGNOSIS — I2510 Atherosclerotic heart disease of native coronary artery without angina pectoris: Secondary | ICD-10-CM

## 2023-05-18 DIAGNOSIS — I25119 Atherosclerotic heart disease of native coronary artery with unspecified angina pectoris: Secondary | ICD-10-CM | POA: Diagnosis not present

## 2023-05-18 HISTORY — DX: Personal history of urinary calculi: Z87.442

## 2023-05-18 HISTORY — PX: CYSTOSCOPY/URETEROSCOPY/HOLMIUM LASER/STENT PLACEMENT: SHX6546

## 2023-05-18 SURGERY — CYSTOSCOPY/URETEROSCOPY/HOLMIUM LASER/STENT PLACEMENT
Anesthesia: General | Site: Ureter | Laterality: Right

## 2023-05-18 MED ORDER — DEXAMETHASONE SODIUM PHOSPHATE 10 MG/ML IJ SOLN
INTRAMUSCULAR | Status: AC
  Filled 2023-05-18: qty 1

## 2023-05-18 MED ORDER — ROCURONIUM BROMIDE 10 MG/ML (PF) SYRINGE
PREFILLED_SYRINGE | INTRAVENOUS | Status: AC
  Filled 2023-05-18: qty 10

## 2023-05-18 MED ORDER — FENTANYL CITRATE PF 50 MCG/ML IJ SOSY: 25.0000 ug | PREFILLED_SYRINGE | INTRAMUSCULAR | Status: DC | PRN

## 2023-05-18 MED ORDER — ROCURONIUM BROMIDE 10 MG/ML (PF) SYRINGE
PREFILLED_SYRINGE | INTRAVENOUS | Status: DC | PRN
  Administered 2023-05-18: 30 mg via INTRAVENOUS

## 2023-05-18 MED ORDER — CHLORHEXIDINE GLUCONATE 0.12 % MT SOLN
15.0000 mL | Freq: Once | OROMUCOSAL | Status: AC
  Administered 2023-05-18: 15 mL via OROMUCOSAL

## 2023-05-18 MED ORDER — PHENYLEPHRINE 80 MCG/ML (10ML) SYRINGE FOR IV PUSH (FOR BLOOD PRESSURE SUPPORT)
PREFILLED_SYRINGE | INTRAVENOUS | Status: AC
  Filled 2023-05-18: qty 10

## 2023-05-18 MED ORDER — ONDANSETRON HCL 4 MG/2ML IJ SOLN
INTRAMUSCULAR | Status: DC | PRN
  Administered 2023-05-18: 4 mg via INTRAVENOUS

## 2023-05-18 MED ORDER — ONDANSETRON HCL 4 MG/2ML IJ SOLN: 4.0000 mg | Freq: Once | INTRAMUSCULAR | Status: DC | PRN

## 2023-05-18 MED ORDER — OXYCODONE HCL 5 MG/5ML PO SOLN: 5.0000 mg | Freq: Once | ORAL | Status: DC | PRN

## 2023-05-18 MED ORDER — ARTIFICIAL TEARS OPHTHALMIC OINT
TOPICAL_OINTMENT | OPHTHALMIC | Status: AC
  Filled 2023-05-18: qty 3.5

## 2023-05-18 MED ORDER — MIDAZOLAM HCL 5 MG/5ML IJ SOLN
INTRAMUSCULAR | Status: DC | PRN
  Administered 2023-05-18: 2 mg via INTRAVENOUS

## 2023-05-18 MED ORDER — LIDOCAINE HCL (PF) 2 % IJ SOLN
INTRAMUSCULAR | Status: AC
  Filled 2023-05-18: qty 5

## 2023-05-18 MED ORDER — LIDOCAINE 2% (20 MG/ML) 5 ML SYRINGE
INTRAMUSCULAR | Status: DC | PRN
  Administered 2023-05-18: 100 mg via INTRAVENOUS

## 2023-05-18 MED ORDER — FENTANYL CITRATE (PF) 100 MCG/2ML IJ SOLN
INTRAMUSCULAR | Status: AC
  Filled 2023-05-18: qty 2

## 2023-05-18 MED ORDER — OXYCODONE HCL 5 MG PO TABS: 5.0000 mg | ORAL_TABLET | Freq: Once | ORAL | Status: DC | PRN

## 2023-05-18 MED ORDER — SUGAMMADEX SODIUM 200 MG/2ML IV SOLN
INTRAVENOUS | Status: DC | PRN
  Administered 2023-05-18: 200 mg via INTRAVENOUS

## 2023-05-18 MED ORDER — FENTANYL CITRATE (PF) 100 MCG/2ML IJ SOLN
INTRAMUSCULAR | Status: DC | PRN
  Administered 2023-05-18: 100 ug via INTRAVENOUS

## 2023-05-18 MED ORDER — ACETAMINOPHEN 325 MG PO TABS: 325.0000 mg | ORAL_TABLET | ORAL | Status: DC | PRN

## 2023-05-18 MED ORDER — DEXAMETHASONE SODIUM PHOSPHATE 10 MG/ML IJ SOLN
INTRAMUSCULAR | Status: DC | PRN
  Administered 2023-05-18: 5 mg via INTRAVENOUS

## 2023-05-18 MED ORDER — 0.9 % SODIUM CHLORIDE (POUR BTL) OPTIME
TOPICAL | Status: DC | PRN
Start: 2023-05-18 — End: 2023-05-18
  Administered 2023-05-18: 1000 mL

## 2023-05-18 MED ORDER — SODIUM CHLORIDE 0.9 % IR SOLN
Status: DC | PRN
  Administered 2023-05-18: 3000 mL

## 2023-05-18 MED ORDER — SUCCINYLCHOLINE CHLORIDE 200 MG/10ML IV SOSY
PREFILLED_SYRINGE | INTRAVENOUS | Status: AC
  Filled 2023-05-18: qty 10

## 2023-05-18 MED ORDER — ORAL CARE MOUTH RINSE: 15.0000 mL | Freq: Once | OROMUCOSAL | Status: AC

## 2023-05-18 MED ORDER — MEPERIDINE HCL 50 MG/ML IJ SOLN: 6.2500 mg | INTRAMUSCULAR | Status: DC | PRN

## 2023-05-18 MED ORDER — CIPROFLOXACIN IN D5W 400 MG/200ML IV SOLN
400.0000 mg | INTRAVENOUS | Status: AC
  Administered 2023-05-18: 400 mg via INTRAVENOUS
  Filled 2023-05-18: qty 200

## 2023-05-18 MED ORDER — MIDAZOLAM HCL 2 MG/2ML IJ SOLN
INTRAMUSCULAR | Status: AC
  Filled 2023-05-18: qty 2

## 2023-05-18 MED ORDER — LACTATED RINGERS IV SOLN: INTRAVENOUS | Status: DC

## 2023-05-18 MED ORDER — ACETAMINOPHEN 160 MG/5ML PO SOLN: 325.0000 mg | ORAL | Status: DC | PRN

## 2023-05-18 MED ORDER — PHENYLEPHRINE HCL-NACL 20-0.9 MG/250ML-% IV SOLN
INTRAVENOUS | Status: AC
  Filled 2023-05-18: qty 250

## 2023-05-18 MED ORDER — PROPOFOL 10 MG/ML IV BOLUS
INTRAVENOUS | Status: AC
  Filled 2023-05-18: qty 20

## 2023-05-18 MED ORDER — ONDANSETRON HCL 4 MG/2ML IJ SOLN
INTRAMUSCULAR | Status: AC
  Filled 2023-05-18: qty 2

## 2023-05-18 MED ORDER — ACETAMINOPHEN 500 MG PO TABS
1000.0000 mg | ORAL_TABLET | Freq: Once | ORAL | Status: AC
  Administered 2023-05-18: 1000 mg via ORAL
  Filled 2023-05-18: qty 2

## 2023-05-18 MED ORDER — PROPOFOL 10 MG/ML IV BOLUS
INTRAVENOUS | Status: DC | PRN
Start: 2023-05-18 — End: 2023-05-18
  Administered 2023-05-18: 120 mg via INTRAVENOUS

## 2023-05-18 MED ORDER — IOHEXOL 300 MG/ML  SOLN
INTRAMUSCULAR | Status: DC | PRN
  Administered 2023-05-18: 20 mL

## 2023-05-18 MED ORDER — PHENYLEPHRINE 80 MCG/ML (10ML) SYRINGE FOR IV PUSH (FOR BLOOD PRESSURE SUPPORT)
PREFILLED_SYRINGE | INTRAVENOUS | Status: DC | PRN
  Administered 2023-05-18: 160 ug via INTRAVENOUS

## 2023-05-18 MED ORDER — SUCCINYLCHOLINE CHLORIDE 200 MG/10ML IV SOSY
PREFILLED_SYRINGE | INTRAVENOUS | Status: DC | PRN
  Administered 2023-05-18: 160 mg via INTRAVENOUS

## 2023-05-18 SURGICAL SUPPLY — 24 items
BAG COUNTER SPONGE SURGICOUNT (BAG) IMPLANT
BAG SPNG CNTER NS LX DISP (BAG)
BAG URO CATCHER STRL LF (MISCELLANEOUS) ×1 IMPLANT
BASKET ZERO TIP NITINOL 2.4FR (BASKET) IMPLANT
BSKT STON RTRVL ZERO TP 2.4FR (BASKET)
CATH URETERAL DUAL LUMEN 10F (MISCELLANEOUS) IMPLANT
CATH URETL OPEN END 6FR 70 (CATHETERS) IMPLANT
CLOTH BEACON ORANGE TIMEOUT ST (SAFETY) ×1 IMPLANT
GLOVE SURG LX STRL 7.5 STRW (GLOVE) ×1 IMPLANT
GOWN STRL REUS W/ TWL XL LVL3 (GOWN DISPOSABLE) ×1 IMPLANT
GOWN STRL REUS W/TWL XL LVL3 (GOWN DISPOSABLE) ×1
GUIDEWIRE STR DUAL SENSOR (WIRE) ×1 IMPLANT
GUIDEWIRE ZIPWRE .038 STRAIGHT (WIRE) IMPLANT
IV NS 1000ML (IV SOLUTION) ×1
IV NS 1000ML BAXH (IV SOLUTION) ×1 IMPLANT
KIT TURNOVER KIT A (KITS) IMPLANT
LASER FIB FLEXIVA PULSE ID 365 (Laser) IMPLANT
MANIFOLD NEPTUNE II (INSTRUMENTS) ×1 IMPLANT
PACK CYSTO (CUSTOM PROCEDURE TRAY) ×1 IMPLANT
SHEATH NAVIGATOR HD 12/14X36 (SHEATH) IMPLANT
TRACTIP FLEXIVA PULS ID 200XHI (Laser) IMPLANT
TRACTIP FLEXIVA PULSE ID 200 (Laser)
TUBING CONNECTING 10 (TUBING) ×1 IMPLANT
TUBING UROLOGY SET (TUBING) ×1 IMPLANT

## 2023-05-18 NOTE — Transfer of Care (Signed)
Immediate Anesthesia Transfer of Care Note  Patient: Leon Allen  Procedure(s) Performed: CYTSOSCOPY, RIGHT URETERAL STENT REMOVAL, DIAGNOSTIC URETEROSCOPY (Right: Ureter)  Patient Location: PACU  Anesthesia Type:General  Level of Consciousness: awake, alert , oriented, and patient cooperative  Airway & Oxygen Therapy: Patient Spontanous Breathing  Post-op Assessment: Report given to RN and Post -op Vital signs reviewed and stable  Post vital signs: Reviewed and stable  Last Vitals:  Vitals Value Taken Time  BP 134/83 05/18/23 0816  Temp    Pulse 72 05/18/23 0819  Resp 13 05/18/23 0819  SpO2 96 % 05/18/23 0819  Vitals shown include unfiled device data.  Last Pain:  Vitals:   05/18/23 0610  TempSrc: Oral  PainSc:       Patients Stated Pain Goal: 4 (05/18/23 0604)  Complications: No notable events documented.

## 2023-05-18 NOTE — Anesthesia Procedure Notes (Signed)
Procedure Name: Intubation Date/Time: 05/18/2023 7:32 AM  Performed by: Bishop Limbo, CRNAPre-anesthesia Checklist: Patient identified, Emergency Drugs available, Suction available and Patient being monitored Patient Re-evaluated:Patient Re-evaluated prior to induction Oxygen Delivery Method: Circle System Utilized Preoxygenation: Pre-oxygenation with 100% oxygen Induction Type: IV induction Ventilation: Mask ventilation without difficulty Laryngoscope Size: Mac and 4 Grade View: Grade I Tube type: Parker flex tip Tube size: 7.5 mm Number of attempts: 1 Airway Equipment and Method: Stylet Placement Confirmation: ETT inserted through vocal cords under direct vision, positive ETCO2 and breath sounds checked- equal and bilateral Secured at: 22 cm Tube secured with: Tape Dental Injury: Teeth and Oropharynx as per pre-operative assessment

## 2023-05-18 NOTE — Interval H&P Note (Signed)
History and Physical Interval Note:  05/18/2023 6:55 AM  Leon Allen  has presented today for surgery, with the diagnosis of NEPHROLITHIASIS.  The various methods of treatment have been discussed with the patient and family. After consideration of risks, benefits and other options for treatment, the patient has consented to  Procedure(s) with comments: RIGHT URETEROSCOPY/HOLMIUM LASER/STENT PLACEMENT (Right) - 90 MINUTES NEEDED FOR CASE as a surgical intervention.  The patient's history has been reviewed, patient examined, no change in status, stable for surgery.  I have reviewed the patient's chart and labs.  Questions were answered to the patient's satisfaction.     [employer]

## 2023-05-18 NOTE — Progress Notes (Signed)
Per Dr Lucinda Dell, hold metoprolol this morning

## 2023-05-18 NOTE — Anesthesia Postprocedure Evaluation (Signed)
Anesthesia Post Note  Patient: Leon Allen  Procedure(s) Performed: CYTSOSCOPY, RIGHT URETERAL STENT REMOVAL, DIAGNOSTIC URETEROSCOPY (Right: Ureter)     Patient location during evaluation: PACU Anesthesia Type: General Level of consciousness: awake and alert Pain management: pain level controlled Vital Signs Assessment: post-procedure vital signs reviewed and stable Respiratory status: spontaneous breathing, nonlabored ventilation, respiratory function stable and patient connected to nasal cannula oxygen Cardiovascular status: blood pressure returned to baseline and stable Postop Assessment: no apparent nausea or vomiting Anesthetic complications: no   No notable events documented.  Last Vitals:  Vitals:   05/18/23 0845 05/18/23 0900  BP: 139/86 (!) 138/94  Pulse: 70 69  Resp: 15   Temp:  37.1 C  SpO2: 96% 100%    Last Pain:  Vitals:   05/18/23 0900  TempSrc:   PainSc: 0-No pain                 Verneda Hollopeter

## 2023-05-18 NOTE — Discharge Instructions (Addendum)
CYSTOSCOPY HOME CARE INSTRUCTIONS  Activity: Rest for the remainder of the day.  Do not drive or operate equipment today.  You may resume normal activities in one to two days as instructed by your physician.   Meals: Drink plenty of liquids and eat light foods such as gelatin or soup this evening.  You may return to a normal meal plan tomorrow.  Return to Work: You may return to work in one to two days or as instructed by your physician.  Special Instructions / Symptoms: Call your physician if any of these symptoms occur:   -persistent or heavy bleeding  -bleeding which continues after first few urination  -large blood clots that are difficult to pass  -urine stream diminishes or stops completely  -fever equal to or higher than 101 degrees Farenheit.  -cloudy urine with a strong, foul odor  -severe pain  Females should always wipe from front to back after elimination.  You may feel some burning pain when you urinate.  This should disappear with time.  Applying moist heat to the lower abdomen or a hot tub bath may help relieve the pain.   Plavix: Ok to restart your Plavix and Asprin in on Wednesday 7/31  Follow up: You will follow up with [place] in 3-4 week with a renal ultrasound

## 2023-05-18 NOTE — Op Note (Signed)
Preoperative diagnosis: right renal and ureteral calculi  Postoperative diagnosis: same   Procedure:  Cystoscopy Diagnostic right ureteroscopy Stent removal  Right retrograde pyelography with interpretation  Surgeon: Moody Bruins. M.D.  Assistants: Jerald Kief, MD, PhD  Anesthesia: General  Complications: None  Intraoperative findings:  -Narrow urethral meatus requiring dilation with Sissy Hoff sounds to accommodate rigid cystoscope. -right retrograde pyelography with no significant evidence of distal filling defect. Right system with mild dilation but no filling defects or contrast extravasation in the kidney.  -Completely patent right ureter without any evidence of stone. -Ureteroscopy revealed no evidence of significant stone burden in the right kidney.  There was evidence of debris near the access point in the interpolar calyx however there was no considerable stone burden.  EBL: Minimal  Specimens: None   Indication: Leon Allen  is a 57 y.o. patient with h/o of >3 cm right stone burden who underwent right PCNL in 04/29/23. He presents today for second look ureteroscopy.  After reviewing the management options for treatment, they elected to proceed with the above surgical procedure(s). We have discussed the potential benefits and risks of the procedure, side effects of the proposed treatment, the likelihood of the patient achieving the goals of the procedure, and any potential problems that might occur during the procedure or recuperation. Informed consent has been obtained.  Description of procedure:  The patient was taken to the operating room and general anesthesia was induced.  The patient was placed in the dorsal lithotomy position, prepped and draped in the usual sterile fashion, and preoperative antibiotics were administered. A preoperative time-out was performed.   Cystourethroscopy was attempted however we were unable to advance the scope  through the urethra.  Urine sounds were then used to dilate the urethral meatus from 18 [place] to 26 [place] to accommodate the cystoscope.  Cystourethroscopy was then performed.  The patient's urethra was examined and was normal. The bladder was then systematically examined in its entirety. There was no evidence for any bladder tumors, stones, or other mucosal pathology.    Attention then turned to the right ureteral orifice which had a right ureteral stent in place without any encrustation.  Using the assistance of a 5 [place] open-ended, a wire was passed alongside the ureteral stent and confirmed to be in the kidney under fluoroscopic guidance.  A grasper was then passed through the scope and the right ureteral stent was removed intact leaving the wire in place.  A dual-lumen was then used to perform a retrograde pyelogram from the distal ureter and the mid ureter using Omnipaque contrast with the findings of above.  The bladder was then emptied.  A dual channel semirigid ureteroscope was then introduced following along with guidewire into the ureter.  The ureter was then evaluated all the way up to the proximal ureter without any evidence of stone.  The ureter was completely patent.  A second sensor wire was then passed through the semirigid ureteroscope and the ureteroscope was then removed leaving the wire in place.  The digital flexible ureteroscope was advanced over that second sensor wire up to the level of proximal ureter under fluoroscopic guidance.  The sensor wire was then removed.  Flexible ureteroscopy was then performed which showed evidence of stone debris nearby the access point from previous PCNL in the interpolar calyx however there was no evidence of any significant stone burden.  The system was evaluated 1 more time.  Given there was no evidence of stone, we  decided to evaluate the ureter 1 more time.  Again there was no evidence of any stone and the ureter was completely patent.  At this  point the flexible ureteroscope was then removed.  Rigid cystoscope was then used to empty the patient's bladder.  The wire was removed and the patient was left without a stent.  The procedure was complete.  The patient appeared to tolerate the procedure well and without complications.  The patient was able to be awakened and transferred to the recovery unit in satisfactory condition.   Jerald Kief, MD, PhD [facility] Resident  [facility]

## 2023-05-19 ENCOUNTER — Encounter (HOSPITAL_COMMUNITY): Payer: Self-pay | Admitting: Urology

## 2023-05-26 ENCOUNTER — Other Ambulatory Visit: Payer: Self-pay

## 2023-05-26 NOTE — Patient Outreach (Signed)
Medicaid Managed Care Social Work Note  05/26/2023 Name:  Leon Allen MRN:  [id number] DOB:  [birth year]  Leon Allen is an 57 y.o. year old male who is a primary patient of [name], [place].  The [facility] Managed Care Coordination team was consulted for assistance with:  Community Resources   [name] was given information about Medicaid Managed Care Coordination team services today. [name] Patient agreed to services and verbal consent obtained.  Engaged with patient  for by telephone forfollow up visit in response to referral for case management and/or care coordination services.   Assessments/Interventions:  Review of past medical history, allergies, medications, health status, including review of consultants reports, laboratory and other test data, was performed as part of comprehensive evaluation and provision of chronic care management services.  SDOH: (Social Determinant of Health) assessments and interventions performed: SDOH Interventions    Flowsheet Row Telephone from 07/07/2022 in [facility]  SDOH Interventions   Food Insecurity Interventions Other (Comment)  [SNAP FNS card from [employer]]  Housing Interventions Intervention Not Indicated  [pt sister assists w/ costs of living]  Transportation Interventions Intervention Not Indicated  Utilities Interventions Intervention Not Indicated, Other (Comment)  [pt sisters assists with costs of living]  Financial Strain Interventions Other (Comment)  [pt family assists currently with costs of living,  mailed [address] application, SNAP card, CAFA and [place] Card]     BSW completed a telephone outreach with patient, he states he did receive the resources BSW sent but has not called any of them yet. He prefers to move into a house because of of all of the things he has. Patient states he is going to start looking on his own soon because there are a lot of houses ava  liable. No other resources are needed at this time.  Advanced Directives Status:  Not addressed in this encounter.  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 08/20/2022   Screening for diabetes mellitus (DM) 08/20/2022   Unstable angina (HCC) 08/13/2022   Essential hypertension 08/08/2022   Erectile dysfunction 03/03/2016   Coronary artery disease 03/03/2016   Chest pain 01/17/2016   Hematuria 01/17/2016   Syncope 01/17/2016   Neck pain 01/17/2016   Nephrolithiasis 01/17/2016   History of tobacco abuse 01/17/2016   Adrenal adenoma 01/17/2016   History of CVA (cerebrovascular accident) 01/17/2016   Cholelithiasis 01/17/2016   Pulmonary nodules 01/17/2016   Diverticulosis 01/17/2016   Marijuana use 01/17/2016    Conditions to be addressed/monitored per PCP order:   community resources  There are no care plans that you recently modified to display for this patient.   Follow up:  Patient agrees to Care Plan and Follow-up.  Plan: The Managed Medicaid care management team will reach  out to the patient again over the next 60 days.  Date/time of next scheduled Social Work care management/care coordination outreach:  07/27/23  Gus Puma, Kenard Gower, [facility]  Managed [facility] Social Worker [phone]

## 2023-05-26 NOTE — Patient Instructions (Signed)
Visit Information  The Patient                                              was given information about Medicaid Managed Care team care coordination services and consented to engagement with the [facility] Managed Care team.   Social Worker will follow up in 60 days.   Abelino Derrick, MHA [facility]  Managed [facility] Social Worker [phone]

## 2023-06-10 ENCOUNTER — Ambulatory Visit: Payer: Self-pay | Admitting: Internal Medicine

## 2023-06-18 DIAGNOSIS — N200 Calculus of kidney: Secondary | ICD-10-CM | POA: Diagnosis not present

## 2023-06-29 NOTE — Progress Notes (Signed)
Cardiology Office Note:  .    Date:  07/07/2023  ID:  Leon Allen, DOB June 10, 1966, MRN [id number] PCP: [name], DO  [facility] Providers Cardiologist:  Christell Constant, MD     CC: CAD f/u  History of Present Illness: .    Leon Allen is a 57 y.o. male CAD, HLD, HTN, former substance abuse, and adrenal adenoma.   2023: Did not want to get LHC due to financial assistance. Had mid LAD stent. With WMAs. 2024: Preop for back surgery.   Mr. Leon Allen, a 57 year old male with a history of coronary artery disease, hyperlipidemia, hypertension, and previous substance abuse, presents with no new complaints. He reports feeling significantly better since our last evaluation. He recently underwent back and kidney stone surgeries. The kidney stone surgery was complicated, taking nine hours instead of the expected four, and resulted in a numb spot on his body and a numb streak down what he believes to be his sciatic nerve.  The patient also reports a recent encounter with ticks and potential poison oak exposure, which has resulted in skin reactions. He has been managing these issues independently but plans to seek medical attention for them with his PCP.  Mr. Leon Allen expresses a desire to reduce his medication intake, citing concerns about potential side effects on his kidneys and liver, as well as easy bruising. He reports self-adjusting his aspirin intake based on perceived blood thickness and has stopped taking vitamins B12 and C, which he had been taking for 19 years.  Relevant histories: .  Social: Close to his cat. ROS: As per HPI.   Studies Reviewed: .   Cardiac Studies & Procedures   CARDIAC CATHETERIZATION  CARDIAC CATHETERIZATION 08/13/2022  Narrative   Mid LAD lesion is 90% stenosed.   Prox RCA lesion is 30% stenosed.   Prox Cx to Mid Cx lesion is 30% stenosed.   A drug-eluting stent was successfully placed using a SYNERGY XD 3.50X20.   Post  intervention, there is a 0% residual stenosis.  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 Successful PTCA/DES x 1 mid LAD covering the entire old stented segment Mild non-obstructive disease in the proximal RCA and mid Circumflex Mild hypokinesis of the anterior wall and apex with overall preserved LV systolic function  Recommendations: DAPT with ASA and Plavix for at least six months but longer if tolerated. Will continue statin and beta blocker.  Findings Coronary Findings Diagnostic  Dominance: Right  Left Anterior Descending Vessel is large. Mid LAD lesion is 90% stenosed. The lesion was previously treated using a drug eluting stent over 2 years ago.  Left Circumflex Vessel is large. Prox Cx to Mid Cx lesion is 30% stenosed.  Right Coronary Artery Vessel is large. Prox RCA lesion is 30% stenosed.  Intervention  Mid LAD lesion Stent CATH VISTA GUIDE 6FR XBLAD3.5 guide catheter was inserted. Lesion crossed with guidewire using a WIRE COUGAR XT STRL 190CM. Pre-stent angioplasty was performed using a BALL SAPPHIRE NC24 3.5X15. A drug-eluting stent was successfully placed using a SYNERGY XD 3.50X20. Stent strut is well apposed. Post-stent angioplasty was performed using a BALL SAPPHIRE NC24 3.75X12. Post-Intervention Lesion Assessment The intervention was successful. Pre-interventional TIMI flow is 3. Post-intervention TIMI flow is 3. No complications occurred at this lesion. There is a 0% residual stenosis post intervention.   STRESS TESTS  NM MYOCAR MULTI W/SPECT W 01/18/2016  Narrative CLINICAL DATA:  Chest pain yesterday. Personal history of coronary artery disease with angioplasty  in 2005. Stroke 2015.  EXAM: MYOCARDIAL IMAGING WITH SPECT (REST AND PHARMACOLOGIC-STRESS)  GATED LEFT VENTRICULAR WALL MOTION STUDY  LEFT VENTRICULAR EJECTION FRACTION  TECHNIQUE: Standard myocardial SPECT imaging was performed after resting intravenous injection of 10 mCi Tc-44m  sestamibi. Subsequently, intravenous infusion of Lexiscan was performed under the supervision of the Cardiology staff. At peak effect of the drug, 30 mCi Tc-31m sestamibi was injected intravenously and standard myocardial SPECT imaging was performed. Quantitative gated imaging was also performed to evaluate left ventricular wall motion, and estimate left ventricular ejection fraction.  COMPARISON:  Two-view chest x-ray 01/17/2016  FINDINGS: Perfusion: No decreased activity in the left ventricle on stress imaging to suggest reversible ischemia or infarction.  Wall Motion: Normal left ventricular wall motion. No left ventricular dilation.  Left Ventricular Ejection Fraction: 52 %  End diastolic volume 132 ml  End systolic volume 64 ml  IMPRESSION: 1. No reversible ischemia or infarction.  2. Normal left ventricular wall motion.  3. Left ventricular ejection fraction 52%  4. Low-risk stress test findings*.  *2012 Appropriate Use Criteria for Coronary Revascularization Focused Update: J Am Coll Cardiol. 2012;59(9):857-881. http://content.dementiazones.com.aspx?articleid=1201161   Electronically Signed By: Marin Roberts M.D. On: 01/18/2016 14:22   ECHOCARDIOGRAM  ECHOCARDIOGRAM COMPLETE 01/18/2016  Narrative *[place]* *[facility]* [address] [place], [place] [address] [phone]  ------------------------------------------------------------------- Transthoracic Echocardiography  Patient:    Leon, Allen MR #:       [id number] Study Date: 01/18/2016 Gender:     M Age:        49 Height:     177.8 cm Weight:     78.2 kg BSA:        1.97 m^2 Pt. Status: Room:       3W09C  ADMITTING    Kayleen Memos REFERRING    Otis Brace [id number] PERFORMING   [facility], Inpatient ATTENDING    Lynelle Doctor, [place] SONOGRAPHER  Thurman Coyer  cc:  ------------------------------------------------------------------- LV EF: 55% -   60%  ------------------------------------------------------------------- Indications:      Syncope 780.2.  ------------------------------------------------------------------- History:   PMH:   Coronary artery disease.  PMH:   Stroke.  Risk factors:  Former tobacco use.  ------------------------------------------------------------------- Study Conclusions  - Left ventricle: The cavity size was normal. Systolic function was normal. The estimated ejection fraction was in the range of 55% to 60%. Wall motion was normal; there were no regional wall motion abnormalities. Left ventricular diastolic function parameters were normal. - Aortic valve: Trileaflet; normal thickness leaflets. There was no regurgitation. - Aortic root: The aortic root was normal in size. - Mitral valve: There was no regurgitation. - Left atrium: The atrium was normal in size. - Right ventricle: The cavity size was normal. Wall thickness was normal. Systolic function was normal. - Right atrium: The atrium was normal in size. - Tricuspid valve: There was trivial regurgitation. - Pulmonic valve: There was no regurgitation. - Pulmonary arteries: The main pulmonary artery was normal-sized. Systolic pressure was within the normal range. - Inferior vena cava: The vessel was normal in size. - Pericardium, extracardiac: There was no pericardial effusion.  Transthoracic echocardiography.  M-mode, complete 2D, spectral Doppler, and color Doppler.  Birthdate:  Patient birthdate: 02/07/1966.  Age:  Patient is 57 yr old.  Sex:  Gender: male. BMI: 24.7 kg/m^2.  Blood pressure:     123/70  Patient status: Inpatient.  Study date:  Study date: 01/18/2016. Study time: 08:22  AM.  Location:  Echo  laboratory.  -------------------------------------------------------------------  ------------------------------------------------------------------- Left ventricle:  The cavity size was normal. Systolic function was normal. The estimated ejection fraction was in the range of 55% to 60%. Wall motion was normal; there were no regional wall motion abnormalities. The transmitral flow pattern was normal. The deceleration time of the early transmitral flow velocity was normal. The pulmonary vein flow pattern was normal. The tissue Doppler parameters were normal. Left ventricular diastolic function parameters were normal.  ------------------------------------------------------------------- Aortic valve:   Trileaflet; normal thickness leaflets. Mobility was not restricted.  Doppler:  Transvalvular velocity was within the normal range. There was no stenosis. There was no regurgitation.  ------------------------------------------------------------------- Aorta:  Aortic root: The aortic root was normal in size.  ------------------------------------------------------------------- Mitral valve:   Structurally normal valve.   Mobility was not restricted.  Doppler:  Transvalvular velocity was within the normal range. There was no evidence for stenosis. There was no regurgitation.  ------------------------------------------------------------------- Left atrium:  The atrium was normal in size.  ------------------------------------------------------------------- Right ventricle:  The cavity size was normal. Wall thickness was normal. Systolic function was normal.  ------------------------------------------------------------------- Pulmonic valve:    Structurally normal valve.   Cusp separation was normal.  Doppler:  Transvalvular velocity was within the normal range. There was no evidence for stenosis. There was  no regurgitation.  ------------------------------------------------------------------- Tricuspid valve:   Structurally normal valve.    Doppler: Transvalvular velocity was within the normal range. There was trivial regurgitation.  ------------------------------------------------------------------- Pulmonary artery:   The main pulmonary artery was normal-sized. Systolic pressure was within the normal range.  ------------------------------------------------------------------- Right atrium:  The atrium was normal in size.  ------------------------------------------------------------------- Pericardium:  There was no pericardial effusion.  ------------------------------------------------------------------- Systemic veins: Inferior vena cava: The vessel was normal in size.  ------------------------------------------------------------------- Measurements  Left ventricle                           Value        Reference LV ID, ED, PLAX chordal                  50.3  mm     43 - 52 LV ID, ES, PLAX chordal                  36.8  mm     23 - 38 LV fx shortening, PLAX chordal   (L)     27    %      >=29 LV PW thickness, ED                      8.34  mm     --------- IVS/LV PW ratio, ED                      1.04         <=1.3 LV ejection fraction, 1-p A4C            51    %      --------- LV end-diastolic volume, 2-p             97    ml     --------- LV end-systolic volume, 2-p              41    ml     --------- LV ejection fraction, 2-p  57    %      --------- Stroke volume, 2-p                       55    ml     --------- LV end-diastolic volume/bsa, 2-p         49    ml/m^2 --------- LV end-systolic volume/bsa, 2-p          21    ml/m^2 --------- Stroke volume/bsa, 2-p                   28    ml/m^2 --------- LV e&', lateral                           7.29  cm/s   --------- LV E/e&', lateral                         9.52         --------- LV e&', medial                             9.14  cm/s   --------- LV E/e&', medial                          7.59         --------- LV e&', average                           8.22  cm/s   --------- LV E/e&', average                         8.45         ---------  Ventricular septum                       Value        Reference IVS thickness, ED                        8.7   mm     ---------  LVOT                                     Value        Reference LVOT ID, S                               21    mm     --------- LVOT area                                3.46  cm^2   ---------  Aorta                                    Value        Reference Aortic root ID, ED                       31  mm     ---------  Left atrium                              Value        Reference LA ID, A-P, ES                           34    mm     --------- LA ID/bsa, A-P                           1.72  cm/m^2 <=2.2 LA volume, S                             51.4  ml     --------- LA volume/bsa, S                         26.1  ml/m^2 --------- LA volume, ES, 1-p A4C                   43    ml     --------- LA volume/bsa, ES, 1-p A4C               21.8  ml/m^2 --------- LA volume, ES, 1-p A2C                   59.7  ml     --------- LA volume/bsa, ES, 1-p A2C               30.3  ml/m^2 ---------  Mitral valve                             Value        Reference Mitral E-wave peak velocity              69.4  cm/s   --------- Mitral A-wave peak velocity              51.9  cm/s   --------- Mitral deceleration time         (H)     423   ms     150 - 230 Mitral E/A ratio, peak                   1.3          ---------  Pulmonary arteries                       Value        Reference PA pressure, S, DP                       17    mm Hg  <=30  Tricuspid valve                          Value        Reference Tricuspid regurg peak velocity           190   cm/s   --------- Tricuspid peak RV-RA gradient            14    mm Hg  ---------  Systemic veins  Value        Reference Estimated CVP                            3     mm Hg  ---------  Right ventricle                          Value        Reference RV pressure, S, DP                       17    mm Hg  <=30 RV s&', lateral, S                        14.6  cm/s   ---------  Legend: (L)  and  (H)  mark values outside specified reference range.  ------------------------------------------------------------------- Prepared and Electronically Authenticated by  Tobias Alexander, M.D. 2017-03-31T11:07:04               Physical Exam:    VS:  BP 126/84   Pulse (!) 58   Ht 5\' 10"  (1.778 m)   Wt 177 lb 12.8 oz (80.6 kg)   SpO2 99%   BMI 25.51 kg/m    Wt Readings from Last 3 Encounters:  07/07/23 177 lb 12.8 oz (80.6 kg)  05/18/23 175 lb 12.8 oz (79.7 kg)  04/29/23 175 lb 12.8 oz (79.7 kg)    Gen: no distress Neck: No JVD Cardiac: No Rubs or Gallops, no murmur, regular bradycardia, +2 radial pulses Respiratory: Clear to auscultation bilaterally, normal effort, normal  respiratory rate GI: Soft, nontender, non-distended  MS: No  edema;  moves all extremities Integument: White skin changes to his right knuckles Neuro:  At time of evaluation, alert and oriented to person/place/time/situation  Psych: Normal affect, patient feels well   ASSESSMENT AND PLAN: .     Coronary Artery Disease Hyperlipidemia Patient is asymptomatic and wishes to reduce medication burden. Discussed the risks/benefits of discontinuing Plavix and reducing Isosorbide Mononitrate. -Discontinue Plavix and maintain Aspirin 81mg  daily. -Reduce Isosorbide Mononitrate to 30mg  daily for one week, then discontinue if no chest pain. If chest pain recurs, resume medication. - continue metoprolol for now Patient's cholesterol is not as low as desired. He is currently on Rosuvastatin 5mg  daily. -Continue Rosuvastatin 5mg  daily; he is not amenable for increase - I have no evidence his medications  are hurting his kidneys  Hypertension - Blood pressure was normal at last evaluation. Patient is on Metoprolol 25mg  daily. -Continue Metoprolol 25mg  daily.  Substance Use - Patient has decreased alcohol use to occasional and stopped using marijuana. -Encourage continued abstinence from marijuana and alcohol cessation with tobacco cessation  GERD -Encourage patient to discuss Protonix 20mg  daily with primary care doctor for potential discontinuation.  One year   Riley Lam, MD FASE [facility] Cardiologist [facility]  [address] [place], [address] [place], [place] [address] [phone]  9:32 AM

## 2023-06-30 DIAGNOSIS — G959 Disease of spinal cord, unspecified: Secondary | ICD-10-CM | POA: Diagnosis not present

## 2023-07-02 ENCOUNTER — Other Ambulatory Visit: Payer: Self-pay

## 2023-07-02 DIAGNOSIS — J449 Chronic obstructive pulmonary disease, unspecified: Secondary | ICD-10-CM

## 2023-07-02 MED ORDER — ALBUTEROL SULFATE HFA 108 (90 BASE) MCG/ACT IN AERS
2.0000 | INHALATION_SPRAY | Freq: Four times a day (QID) | RESPIRATORY_TRACT | 2 refills | Status: DC | PRN
Start: 2023-07-02 — End: 2023-10-19

## 2023-07-03 ENCOUNTER — Other Ambulatory Visit: Payer: Self-pay | Admitting: Internal Medicine

## 2023-07-07 ENCOUNTER — Encounter (HOSPITAL_COMMUNITY): Payer: Self-pay

## 2023-07-07 ENCOUNTER — Ambulatory Visit: Payer: Medicaid Other | Attending: Internal Medicine | Admitting: Internal Medicine

## 2023-07-07 ENCOUNTER — Encounter: Payer: Self-pay | Admitting: Internal Medicine

## 2023-07-07 ENCOUNTER — Ambulatory Visit (HOSPITAL_COMMUNITY)
Admission: EM | Admit: 2023-07-07 | Discharge: 2023-07-07 | Disposition: A | Discharge status: Home / Self Care | Payer: Medicaid Other | Attending: Family Medicine | Admitting: Family Medicine

## 2023-07-07 VITALS — BP 126/84 | HR 58 | Ht 70.0 in | Wt 177.8 lb

## 2023-07-07 DIAGNOSIS — E782 Mixed hyperlipidemia: Secondary | ICD-10-CM | POA: Diagnosis not present

## 2023-07-07 DIAGNOSIS — N200 Calculus of kidney: Secondary | ICD-10-CM | POA: Diagnosis not present

## 2023-07-07 DIAGNOSIS — I25118 Atherosclerotic heart disease of native coronary artery with other forms of angina pectoris: Secondary | ICD-10-CM

## 2023-07-07 DIAGNOSIS — I10 Essential (primary) hypertension: Secondary | ICD-10-CM | POA: Diagnosis not present

## 2023-07-07 DIAGNOSIS — L259 Unspecified contact dermatitis, unspecified cause: Secondary | ICD-10-CM

## 2023-07-07 DIAGNOSIS — F1290 Cannabis use, unspecified, uncomplicated: Secondary | ICD-10-CM | POA: Diagnosis not present

## 2023-07-07 DIAGNOSIS — Z87891 Personal history of nicotine dependence: Secondary | ICD-10-CM

## 2023-07-07 MED ORDER — ISOSORBIDE MONONITRATE ER 30 MG PO TB24: 30.0000 mg | ORAL_TABLET | Freq: Every day | ORAL | 0 refills | Status: DC

## 2023-07-07 MED ORDER — DEXAMETHASONE SODIUM PHOSPHATE 10 MG/ML IJ SOLN
10.0000 mg | Freq: Once | INTRAMUSCULAR | Status: AC
  Administered 2023-07-07: 10 mg via INTRAMUSCULAR

## 2023-07-07 MED ORDER — ASPIRIN 81 MG PO TBEC: 81.0000 mg | DELAYED_RELEASE_TABLET | Freq: Every day | ORAL | Status: DC

## 2023-07-07 MED ORDER — DEXAMETHASONE SODIUM PHOSPHATE 10 MG/ML IJ SOLN
INTRAMUSCULAR | Status: AC
  Filled 2023-07-07: qty 1

## 2023-07-07 NOTE — Discharge Instructions (Signed)
Meds ordered this encounter  Medications   dexamethasone (DECADRON) injection 10 mg

## 2023-07-07 NOTE — ED Provider Notes (Signed)
[facility]   [id number] 07/07/23 Arrival Time: 0955  ASSESSMENT & PLAN:  1. Contact dermatitis, unspecified contact dermatitis type, unspecified trigger    Rhus vs chigger bites. No signs of skin infection. Prefers IM vs PO tx.  Meds ordered this encounter  Medications   dexamethasone (DECADRON) injection 10 mg   Benadryl if needed.  Will follow up with PCP or here if worsening or failing to improve as anticipated. Reviewed expectations re: course of current medical issues. Questions answered. Outlined signs and symptoms indicating need for more acute intervention. Patient verbalized understanding. After Visit Summary given.   SUBJECTIVE:  KARLITO HOGGARTH is a 57 y.o. male who presents with a skin complaint. In woods recently. Now with itchy skin and slight rash. Denies fever. OTC without relief.   OBJECTIVE: Vitals:   07/07/23 1026  BP: (!) 148/86  Pulse: (!) 53  Resp: 14  Temp: 97.6 F (36.4 C)  TempSrc: Oral  SpO2: 98%    General appearance: alert; no distress HEENT: Shell; AT Neck: supple with FROM Lungs: clear to auscultation bilaterally Heart: regular rate and rhythm Extremities: no edema; moves all extremities normally Skin: warm and dry; signs of infection: no; areas of papules with surrounding erythema over extremities mainly Psychological: alert and cooperative; normal mood and affect  Allergies  Allergen Reactions   Atorvastatin Other (See Comments)    Chest pressure    Bee Venom Swelling   Penicillins Other (See Comments)    Childhood Allergy     Past Medical History:  Diagnosis Date   Adrenal adenoma    bilateral   Complication of anesthesia    "@ dentist office; was overdosed"   Coronary artery disease    CVA (cerebral vascular accident) (HCC) 10/20/2013   denies residual on 01/17/2016   Gallstones    GERD (gastroesophageal reflux disease)    Gonorrhea 10/20/2013   Heart attack (HCC) 10/21/2003   Heart murmur     Hematuria 12/19/2015   History of kidney stones    Social History   Socioeconomic History   Marital status: Divorced    Spouse name: Not on file   Number of children: 2   Years of education: Not on file   Highest education level: Not on file  Occupational History   Not on file  Tobacco Use   Smoking status: Former    Current packs/day: 0.00    Average packs/day: 3.0 packs/day for 40.0 years (120.0 ttl pk-yrs)    Types: Cigarettes    Start date: 10/21/1975    Quit date: 10/21/2015    Years since quitting: 7.7   Smokeless tobacco: Former    Types: Chew   Tobacco comments:    "chewed some when I was young; not for long"  Vaping Use   Vaping status: Former  Substance and Sexual Activity   Alcohol use: Yes    Comment: rare   Drug use: Yes    Types: Marijuana   Sexual activity: Yes  Other Topics Concern   Not on file  Social History Narrative   Lives home alone   Social Determinants of Health   Financial Resource Strain: High Risk (07/07/2022)   Overall Financial Resource Strain (CARDIA)    Difficulty of Paying Living Expenses: Hard  Food Insecurity: No Food Insecurity (04/30/2023)   Hunger Vital Sign    Worried About Running Out of Food in the Last Year: Never true    Ran Out of Food in the Last Year: Never true  Transportation Needs: No Transportation Needs (04/30/2023)   PRAPARE - Administrator, Civil Service (Medical): No    Lack of Transportation (Non-Medical): No  Physical Activity: Sufficiently Active (08/20/2022)   Exercise Vital Sign    Days of Exercise per Week: 7 days    Minutes of Exercise per Session: 60 min  Stress: No Stress Concern Present (08/20/2022)   [employer] of Occupational Health - Occupational Stress Questionnaire    Feeling of Stress : Only a little  Social Connections: Socially Isolated (08/20/2022)   Social Connection and Isolation Panel [NHANES]    Frequency of Communication with Friends and Family: More than three times a  week    Frequency of Social Gatherings with Friends and Family: Twice a week    Attends Religious Services: Never    Database administrator or Organizations: No    Attends Banker Meetings: Never    Marital Status: Divorced  Catering manager Violence: Not At Risk (04/30/2023)   Humiliation, Afraid, Rape, and Kick questionnaire    Fear of Current or Ex-Partner: No    Emotionally Abused: No    Physically Abused: No    Sexually Abused: No   Family History  Problem Relation Age of Onset   Cancer Mother        Hodgkins lymphoma   Breast cancer Sister    Heart attack Other    Past Surgical History:  Procedure Laterality Date   ANTERIOR CERVICAL DECOMP/DISCECTOMY FUSION N/A 03/10/2023   Procedure: CERVICAL FOUR-FIVE ANTERIOR CERVICAL DECOMPRESSION/DISCECTOMY FUSION;  Surgeon: Coletta Memos, MD;  Location: [facility] OR;  Service: Neurosurgery;  Laterality: N/A;  RM 21 3C   APPENDECTOMY     CORONARY ANGIOPLASTY WITH STENT PLACEMENT  2005   CORONARY STENT INTERVENTION N/A 08/13/2022   Procedure: CORONARY STENT INTERVENTION;  Surgeon: Kathleene Hazel, MD;  Location: [facility] INVASIVE CV LAB;  Service: Cardiovascular;  Laterality: N/A;   CYSTOSCOPY/URETEROSCOPY/HOLMIUM LASER/STENT PLACEMENT Right 05/18/2023   Procedure: CYTSOSCOPY, RIGHT URETERAL STENT REMOVAL, DIAGNOSTIC URETEROSCOPY;  Surgeon: Heloise Purpura, MD;  Location: [facility] ORS;  Service: Urology;  Laterality: Right;  90 MINUTES NEEDED FOR CASE   INGUINAL HERNIA REPAIR Right    LEFT HEART CATH AND CORONARY ANGIOGRAPHY N/A 08/13/2022   Procedure: LEFT HEART CATH AND CORONARY ANGIOGRAPHY;  Surgeon: Kathleene Hazel, MD;  Location: [facility] INVASIVE CV LAB;  Service: Cardiovascular;  Laterality: N/A;   LITHOTRIPSY  X 2   NEPHROLITHOTOMY Right 04/29/2023   Procedure: RIGHT NEPHROLITHOTOMY PERCUTANEOUS-SURGEON ACCESS;  Surgeon: Crist Fat, MD;  Location: [facility] ORS;  Service: Urology;  Laterality: Right;  210 MINUTES       Mardella Layman, MD 07/07/23 1055

## 2023-07-07 NOTE — Patient Instructions (Addendum)
Medication Instructions:  Your physician has recommended you make the following change in your medication:  DECREASE: isosorbide mononitrate (Imdur) to 30 mg once daily then stop; if chest pain returns please restart Imdur 60 mg and notify our office.   STOP: clopidogrel (Plavix) START: Aspirin 81 mg by mouth once daily *If you need a refill on your cardiac medications before your next appointment, please call your pharmacy*   Lab Work: NONE If you have labs (blood work) drawn today and your tests are completely normal, you will receive your results only by: MyChart Message (if you have MyChart) OR A paper copy in the mail If you have any lab test that is abnormal or we need to change your treatment, we will call you to review the results.   Testing/Procedures: NONE   Follow-Up: At [facility], you and your health needs are our priority.  As part of our continuing mission to provide you with exceptional heart care, we have created designated Provider Care Teams.  These Care Teams include your primary Cardiologist (physician) and Advanced Practice Providers (APPs -  Physician Assistants and Nurse Practitioners) who all work together to provide you with the care you need, when you need it.   Your next appointment:   1 year(s)  Provider:   Christell Constant, MD

## 2023-07-07 NOTE — ED Triage Notes (Signed)
Patient reports that he thinks he got into poison ivy 3 days ago. Patient states he also had multiple ticks on his ankles. Patient states the rash is all over now.  Patient states he has used Calamine lotion.

## 2023-07-20 ENCOUNTER — Ambulatory Visit: Payer: Medicaid Other | Admitting: Student

## 2023-07-27 ENCOUNTER — Other Ambulatory Visit: Payer: Medicaid Other

## 2023-07-27 NOTE — Patient Instructions (Signed)
Visit Information  Leon Allen was given information about Medicaid Managed Care team care coordination services as a part of their [employer] Medicaid benefit. Leon Allen verbally consentedto engagement with the [facility] Managed Care team.   If you are experiencing a medical emergency, please call 911 or report to your local emergency department or urgent care.   If you have a non-emergency medical problem during routine business hours, please contact your provider's office and ask to speak with a nurse.   For questions related to your [facility] health plan, please call: [phone]  OR visit the member homepage at: [email or web address]  If you would like to schedule transportation through your [facility] plan, please call the following number at least 2 days in advance of your appointment: [phone]  If you are experiencing a behavioral health crisis, call the [facility] Crisis Line at [phone]). The line is available 24 hours a day, seven days a week.  If you would like help to quit smoking, call 1-800-QUIT-NOW ((320)419-8820) OR Espaol: 1-855-Djelo-Ya (8-676-195-0932) o para ms informacin haga clic aqu or Text READY to 671-245 to register via text   Leon Allen - following are the goals we discussed in your visit today:   Goals Addressed   None      Social Worker will follow up on 09/28/23.   Leon Allen, Leon Allen, MHA [facility]  Managed Medicaid Social Worker [phone]   Following is a copy of your plan of care:  There are no care plans that you recently modified to display for this patient.

## 2023-07-27 NOTE — Patient Outreach (Signed)
Medicaid Managed Care Social Work Note  07/27/2023 Name:  Leon Allen MRN:  [id number] DOB:  01-24-1966  Leon Allen is an 57 y.o. year old male who is a primary patient of [name], [place].  The [facility] Managed Care Coordination team was consulted for assistance with:  Community Resources   Mr. Rippon was given information about Medicaid Managed Care Coordination team services today. [name] Patient agreed to services and verbal consent obtained.  Engaged with patient  for by telephone forfollow up visit in response to referral for case management and/or care coordination services.   Assessments/Interventions:  Review of past medical history, allergies, medications, health status, including review of consultants reports, laboratory and other test data, was performed as part of comprehensive evaluation and provision of chronic care management services.  SDOH: (Social Determinant of Health) assessments and interventions performed: SDOH Interventions    Flowsheet Row Telephone from 07/07/2022 in [facility]  SDOH Interventions   Food Insecurity Interventions Other (Comment)  [SNAP FNS card from [employer]]  Housing Interventions Intervention Not Indicated  [pt sister assists w/ costs of living]  Transportation Interventions Intervention Not Indicated  Utilities Interventions Intervention Not Indicated, Other (Comment)  [pt sisters assists with costs of living]  Financial Strain Interventions Other (Comment)  [pt family assists currently with costs of living,  mailed [address] application, SNAP card, CAFA and Orange Card]     BSW completed a telephone outreach with patient, he states he did find an apartment and will be moving next month. Patient states he has started back working as well. Patient states he did have some medical bills that Medicaid did cover, he also had bills that went into collections from [facility]. Patient  states he will be giving medicaid a call to see if they will cover them. No resources are needed at this time.  Advanced Directives Status:  Not addressed in this encounter.  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 08/20/2022   Screening for diabetes mellitus (DM) 08/20/2022   Essential hypertension 08/08/2022   Erectile dysfunction 03/03/2016   Coronary artery disease 03/03/2016   Neck pain 01/17/2016   Nephrolithiasis 01/17/2016   History of tobacco abuse 01/17/2016   Adrenal adenoma 01/17/2016   History of CVA (cerebrovascular accident) 01/17/2016   Cholelithiasis 01/17/2016   Pulmonary nodules 01/17/2016   Diverticulosis 01/17/2016   Marijuana use 01/17/2016    Conditions to be addressed/monitored per PCP order:   community resources  There are no care plans that you recently modified to display for this patient.   Follow up:  Patient agrees to Care Plan and Follow-up.  Plan: The Managed Medicaid care management team will reach out to the patient again over the next 60 days.  Date/time of next  scheduled Social Work care management/care coordination outreach:  09/28/23  Gus Puma, Kenard Gower, [facility]  Managed [facility] Social Worker [phone]

## 2023-08-30 ENCOUNTER — Other Ambulatory Visit: Payer: Self-pay | Admitting: Internal Medicine

## 2023-09-25 ENCOUNTER — Other Ambulatory Visit: Payer: Self-pay | Admitting: Internal Medicine

## 2023-09-28 ENCOUNTER — Other Ambulatory Visit: Payer: Self-pay

## 2023-09-28 NOTE — Patient Instructions (Signed)
Visit Information  Leon Allen was given information about Medicaid Managed Care team care coordination services as a part of their [employer] Medicaid benefit. Leon Allen verbally consentedto engagement with the [facility] Managed Care team.   If you are experiencing a medical emergency, please call 911 or report to your local emergency department or urgent care.   If you have a non-emergency medical problem during routine business hours, please contact your provider's office and ask to speak with a nurse.   For questions related to your [facility] health plan, please call: [phone]  OR visit the member homepage at: [email or web address]  If you would like to schedule transportation through your [facility] plan, please call the following number at least 2 days in advance of your appointment: [phone]  If you are experiencing a behavioral health crisis, call the [facility] Crisis Line at [phone]). The line is available 24 hours a day, seven days a week.  If you would like help to quit smoking, call 1-800-QUIT-NOW ((629) 320-3504) OR Espaol: 1-855-Djelo-Ya (6-644-034-7425) o para ms informacin haga clic aqu or Text READY to 956-387 to register via text   [name] - following are the goals we discussed in your visit today:   Goals Addressed   None       The  Patient                                              has been provided with contact information for the Managed Medicaid care management team and has been advised to call with any health related questions or concerns.   Leon Allen, Leon Allen, MHA [facility]  Managed Medicaid Social Worker [phone]   Following is a copy of your plan of care:  There are no care plans that you recently modified to display for this patient.

## 2023-09-28 NOTE — Patient Outreach (Signed)
  Medicaid Managed Care Social Work Note  09/28/2023 Name:  Leon Allen MRN:  [id number] DOB:  1966/08/28  Leon Allen is an 57 y.o. year old male who is a primary patient of [name], [place].  The [facility] Managed Care Coordination team was consulted for assistance with:  Community Resources   Mr. Cifuentes was given information about Medicaid Managed Care Coordination team services today. [name] Patient agreed to services and verbal consent obtained.  Engaged with patient  for by telephone forfollow up visit in response to referral for case management and/or care coordination services.   Assessments/Interventions:  Review of past medical history, allergies, medications, health status, including review of consultants reports, laboratory and other test data, was performed as part of comprehensive evaluation and provision of chronic care management services.  SDOH: (Social Determinant of Health) assessments and interventions performed: SDOH Interventions    Flowsheet Row Telephone from 07/07/2022 in [facility]  SDOH Interventions   Food Insecurity Interventions Other (Comment)  [SNAP FNS card from [employer]]  Housing Interventions Intervention Not Indicated  [pt sister assists w/ costs of living]  Transportation Interventions Intervention Not Indicated  Utilities Interventions Intervention Not Indicated, Other (Comment)  [pt sisters assists with costs of living]  Financial Strain Interventions Other (Comment)  [pt family assists currently with costs of living,  mailed [address] application, SNAP card, CAFA and [employer] Card]     BSW completed a telephone outreach with patient, he states all of his medical bills were paid except one. He states he is currently living with his daughter and has no needs at this time.   Advanced Directives Status:  Not addressed in this encounter.  Care Plan                 Allergies  Allergen  Reactions   Atorvastatin Other (See Comments)    Chest pressure    Bee Venom Swelling   Penicillins Other (See Comments)    Childhood Allergy     Medications Reviewed Today   Medications were not reviewed in this encounter     Patient Active Problem List   Diagnosis Date Noted   Renal calculus 04/29/2023   Chronic obstructive pulmonary disease (HCC) 04/24/2023   Incidental pulmonary nodule, > 3mm and < 8mm 03/26/2023   Hyperlipidemia 03/26/2023   HNP (herniated nucleus pulposus), cervical 03/10/2023   Hospital discharge follow-up 08/20/2022   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 08/20/2022   Screening for diabetes mellitus (DM) 08/20/2022   Essential hypertension 08/08/2022   Erectile dysfunction 03/03/2016   Coronary artery disease 03/03/2016   Neck pain 01/17/2016   Nephrolithiasis 01/17/2016   History of tobacco abuse 01/17/2016   Adrenal adenoma 01/17/2016   History of CVA (cerebrovascular accident) 01/17/2016   Cholelithiasis 01/17/2016   Pulmonary nodules 01/17/2016   Diverticulosis 01/17/2016   Marijuana use 01/17/2016    Conditions to be addressed/monitored per PCP order:   community resources  There are no care plans that you recently modified to display for this patient.   Follow up:  Patient agrees to Care Plan and Follow-up.  Plan: The  Patient has been provided with contact information for the Managed Medicaid care management team and has been advised to call with any health related questions or concerns.    Abelino Derrick, MHA [facility]  Managed [facility] Social Worker [phone]

## 2023-10-04 ENCOUNTER — Other Ambulatory Visit: Payer: Self-pay

## 2023-10-04 ENCOUNTER — Emergency Department (HOSPITAL_COMMUNITY)
Admission: EM | Admit: 2023-10-04 | Discharge: 2023-10-05 | Disposition: A | Discharge status: Home / Self Care | Payer: Medicaid Other | Attending: Emergency Medicine | Admitting: Emergency Medicine

## 2023-10-04 ENCOUNTER — Emergency Department (HOSPITAL_COMMUNITY): Payer: Medicaid Other

## 2023-10-04 DIAGNOSIS — I2510 Atherosclerotic heart disease of native coronary artery without angina pectoris: Secondary | ICD-10-CM | POA: Insufficient documentation

## 2023-10-04 DIAGNOSIS — R0789 Other chest pain: Secondary | ICD-10-CM | POA: Insufficient documentation

## 2023-10-04 DIAGNOSIS — R109 Unspecified abdominal pain: Secondary | ICD-10-CM | POA: Diagnosis not present

## 2023-10-04 DIAGNOSIS — N200 Calculus of kidney: Secondary | ICD-10-CM

## 2023-10-04 DIAGNOSIS — N132 Hydronephrosis with renal and ureteral calculous obstruction: Secondary | ICD-10-CM | POA: Diagnosis not present

## 2023-10-04 DIAGNOSIS — K8020 Calculus of gallbladder without cholecystitis without obstruction: Secondary | ICD-10-CM | POA: Diagnosis not present

## 2023-10-04 DIAGNOSIS — Z7982 Long term (current) use of aspirin: Secondary | ICD-10-CM | POA: Diagnosis not present

## 2023-10-04 DIAGNOSIS — D72829 Elevated white blood cell count, unspecified: Secondary | ICD-10-CM | POA: Insufficient documentation

## 2023-10-04 DIAGNOSIS — Z79899 Other long term (current) drug therapy: Secondary | ICD-10-CM | POA: Insufficient documentation

## 2023-10-04 DIAGNOSIS — J449 Chronic obstructive pulmonary disease, unspecified: Secondary | ICD-10-CM | POA: Diagnosis not present

## 2023-10-04 DIAGNOSIS — K429 Umbilical hernia without obstruction or gangrene: Secondary | ICD-10-CM | POA: Diagnosis not present

## 2023-10-04 DIAGNOSIS — R110 Nausea: Secondary | ICD-10-CM | POA: Diagnosis not present

## 2023-10-04 DIAGNOSIS — K4090 Unilateral inguinal hernia, without obstruction or gangrene, not specified as recurrent: Secondary | ICD-10-CM | POA: Diagnosis not present

## 2023-10-04 DIAGNOSIS — R079 Chest pain, unspecified: Secondary | ICD-10-CM | POA: Diagnosis not present

## 2023-10-04 DIAGNOSIS — R319 Hematuria, unspecified: Secondary | ICD-10-CM | POA: Diagnosis not present

## 2023-10-04 LAB — CBC
HCT: 43.8 % (ref 39.0–52.0)
Hemoglobin: 14.7 g/dL (ref 13.0–17.0)
MCH: 30.6 pg (ref 26.0–34.0)
MCHC: 33.6 g/dL (ref 30.0–36.0)
MCV: 91.1 fL (ref 80.0–100.0)
Platelets: 226 10*3/uL (ref 150–400)
RBC: 4.81 MIL/uL (ref 4.22–5.81)
RDW: 12.8 % (ref 11.5–15.5)
WBC: 10.7 10*3/uL — ABNORMAL HIGH (ref 4.0–10.5)
nRBC: 0 % (ref 0.0–0.2)

## 2023-10-04 LAB — URINALYSIS, ROUTINE W REFLEX MICROSCOPIC
Bilirubin Urine: NEGATIVE
Glucose, UA: NEGATIVE mg/dL
Ketones, ur: NEGATIVE mg/dL
Leukocytes,Ua: NEGATIVE
Nitrite: NEGATIVE
Protein, ur: 30 mg/dL — AB
RBC / HPF: >50 RBC/hpf (ref 0–5)
Specific Gravity, Urine: 1.019 (ref 1.005–1.030)
pH: 5 (ref 5.0–8.0)

## 2023-10-04 LAB — COMPREHENSIVE METABOLIC PANEL
ALT: 21 U/L (ref 0–44)
AST: 19 U/L (ref 15–41)
Albumin: 3.6 g/dL (ref 3.5–5.0)
Alkaline Phosphatase: 66 U/L (ref 38–126)
Anion gap: 8 (ref 5–15)
BUN: 25 mg/dL — ABNORMAL HIGH (ref 6–20)
CO2: 25 mmol/L (ref 22–32)
Calcium: 9.3 mg/dL (ref 8.9–10.3)
Chloride: 107 mmol/L (ref 98–111)
Creatinine, Ser: 1.22 mg/dL (ref 0.61–1.24)
GFR, Estimated: >60 mL/min (ref >60)
Glucose, Bld: 123 mg/dL — ABNORMAL HIGH (ref 70–99)
Potassium: 4.5 mmol/L (ref 3.5–5.1)
Sodium: 140 mmol/L (ref 135–145)
Total Bilirubin: 0.9 mg/dL (ref <1.2)
Total Protein: 6.8 g/dL (ref 6.5–8.1)

## 2023-10-04 LAB — TROPONIN I (HIGH SENSITIVITY)
Troponin I (High Sensitivity): <2 ng/L (ref <18)
Troponin I (High Sensitivity): 2 ng/L (ref <18)

## 2023-10-04 LAB — LIPASE, BLOOD: Lipase: 34 U/L (ref 11–51)

## 2023-10-04 MED ORDER — TAMSULOSIN HCL 0.4 MG PO CAPS
0.4000 mg | ORAL_CAPSULE | Freq: Once | ORAL | Status: AC
  Administered 2023-10-05: 0.4 mg via ORAL
  Filled 2023-10-04: qty 1

## 2023-10-04 MED ORDER — ONDANSETRON HCL 4 MG/2ML IJ SOLN
4.0000 mg | Freq: Once | INTRAMUSCULAR | Status: AC
  Administered 2023-10-04: 4 mg via INTRAVENOUS
  Filled 2023-10-04: qty 2

## 2023-10-04 MED ORDER — MORPHINE SULFATE (PF) 4 MG/ML IV SOLN
4.0000 mg | Freq: Once | INTRAVENOUS | Status: AC
  Administered 2023-10-04: 4 mg via INTRAVENOUS
  Filled 2023-10-04: qty 1

## 2023-10-04 MED ORDER — KETOROLAC TROMETHAMINE 15 MG/ML IJ SOLN
15.0000 mg | Freq: Once | INTRAMUSCULAR | Status: AC
  Administered 2023-10-04: 15 mg via INTRAVENOUS
  Filled 2023-10-04: qty 1

## 2023-10-04 MED ORDER — HYDROCODONE-ACETAMINOPHEN 5-325 MG PO TABS
1.0000 | ORAL_TABLET | Freq: Four times a day (QID) | ORAL | 0 refills | Status: AC | PRN
Start: 2023-10-04 — End: 2023-10-09

## 2023-10-04 MED ORDER — SODIUM CHLORIDE 0.9 % IV BOLUS
1000.0000 mL | Freq: Once | INTRAVENOUS | Status: AC
  Administered 2023-10-04: 1000 mL via INTRAVENOUS

## 2023-10-04 MED ORDER — SODIUM CHLORIDE 0.9 % IV BOLUS: 1000.0000 mL | Freq: Once | INTRAVENOUS | Status: DC

## 2023-10-04 MED ORDER — TAMSULOSIN HCL 0.4 MG PO CAPS: 0.4000 mg | ORAL_CAPSULE | Freq: Every day | ORAL | 0 refills | Status: AC

## 2023-10-04 NOTE — Discharge Instructions (Addendum)
You were a CT scan showed to small kidney stones from the left kidney that are causing a mild blockage.  Your CT scan showed the following incidental findings: Cholelithiasis  Stable bilateral benign adrenal adenomas   Please schedule an appointment with your urologist first thing Monday morning regarding your kidney stones.  Please schedule an appoint with your cardiologist as soon as possible.  Please return to the emergency department if you develop worsening chest pain especially if you have associated shortness of breath, nausea, or vomiting.  You should also return if you are having difficulty urinating, feel that you are not fully emptying your bladder, or having new fevers.

## 2023-10-04 NOTE — ED Provider Notes (Signed)
[place] EMERGENCY DEPARTMENT AT [facility] Provider Note   CSN: [id number] Arrival date & time: 10/04/23  1904     History {Add pertinent medical, surgical, social history, OB history to HPI:1} Chief Complaint  Patient presents with   Chest Pain   Flank Pain    Leon Allen is a 57 y.o. male.  Patient with history of CAD, CVA, GERD, MI with LAD stent placement in 2005 and revision of stent placement in 2023, kidney stones presents today with complaints of chest pain and left flank pain. He states that he has been having persistent left sided chest pain for the past 3 weeks.  Pain is left-sided in nature.  He states that it feels like when he required a revision of his stent placement last year.  States that "I've been meaning to get it check out for the past few weeks but just had not gotten around to it." However today he started having flank pain and feels like he has a kidney stone and presents with concern for same. States that he has had several kidney stones previously and has had to have surgical intervention for these previously. Denies fevers or chills. No shortness of breath, nausea, vomiting, or diarrhea. No urinary symptoms.   The history is provided by the patient. No language interpreter was used.  Chest Pain Flank Pain Associated symptoms include chest pain.       Home Medications Prior to Admission medications   Medication Sig Start Date End Date Taking? Authorizing Provider  albuterol (VENTOLIN HFA) 108 (90 Base) MCG/ACT inhaler Inhale 2 puffs into the lungs every 6 (six) hours as needed for wheezing or shortness of breath. 07/02/23   [name], DO  ascorbic acid (VITAMIN C) 500 MG tablet Take 2,000 mg by mouth daily.    [provider]  aspirin EC 81 MG tablet Take 1 tablet (81 mg total) by mouth daily. Swallow whole. 07/07/23   Christell Constant, MD  isosorbide mononitrate (IMDUR) 30 MG 24 hr tablet Take 1 tablet (30 mg  total) by mouth daily for 7 days. Take for 7 days then stop, if chest pain starts restart 07/07/23 07/14/23  Riley Lam A, MD  metoprolol succinate (TOPROL-XL) 25 MG 24 hr tablet TAKE 1 TABLET (25 MG TOTAL) BY MOUTH DAILY. 09/29/23   Chandrasekhar, Rondel Jumbo, MD  nitroGLYCERIN (NITROSTAT) 0.4 MG SL tablet Place 1 tablet (0.4 mg total) under the tongue every 5 (five) minutes as needed for chest pain. 07/07/22   Riley Lam A, MD  pantoprazole (PROTONIX) 20 MG tablet TAKE 1 TABLET BY MOUTH EVERY DAY 02/10/23   Carlos Levering, NP  rosuvastatin (CRESTOR) 5 MG tablet TAKE 1 TABLET (5 MG TOTAL) BY MOUTH DAILY. 08/31/23   Chandrasekhar, Rondel Jumbo, MD  traMADol (ULTRAM) 50 MG tablet Take 1-2 tablets (50-100 mg total) by mouth every 6 (six) hours as needed for moderate pain. 04/30/23   Crist Fat, MD  vitamin B-12 (CYANOCOBALAMIN) 500 MCG tablet Take 2,000 mcg by mouth daily.    [provider]      Allergies    Atorvastatin, Bee venom, and Penicillins    Review of Systems   Review of Systems  Cardiovascular:  Positive for chest pain.  Genitourinary:  Positive for flank pain.  All other systems reviewed and are negative.   Physical Exam Updated Vital Signs BP (!) 161/99   Pulse (!) 54   Temp 98.1 F (36.7 C) (Oral)   Resp  10   Ht 5\' 10"  (1.778 m)   Wt 80 kg   SpO2 100%   BMI 25.31 kg/m  Physical Exam Vitals and nursing note reviewed.  Constitutional:      General: He is not in acute distress.    Appearance: Normal appearance. He is normal weight. He is not ill-appearing, toxic-appearing or diaphoretic.  HENT:     Head: Normocephalic and atraumatic.  Cardiovascular:     Rate and Rhythm: Normal rate and regular rhythm.     Pulses:          Dorsalis pedis pulses are 2+ on the right side and 2+ on the left side.       Posterior tibial pulses are 2+ on the right side and 2+ on the left side.     Heart sounds: Normal heart sounds.  Pulmonary:      Effort: Pulmonary effort is normal. No respiratory distress.     Breath sounds: Normal breath sounds.  Abdominal:     General: Abdomen is flat.     Palpations: Abdomen is soft.     Tenderness: There is left CVA tenderness.  Musculoskeletal:        General: Normal range of motion.     Cervical back: Normal range of motion.     Right lower leg: No tenderness. No edema.     Left lower leg: No tenderness. No edema.  Skin:    General: Skin is warm and dry.  Neurological:     General: No focal deficit present.     Mental Status: He is alert.  Psychiatric:        Mood and Affect: Mood normal.        Behavior: Behavior normal.     ED Results / Procedures / Treatments   Labs (all labs ordered are listed, but only abnormal results are displayed) Labs Reviewed  CBC - Abnormal; Notable for the following components:      Result Value   WBC 10.7 (*)    All other components within normal limits  COMPREHENSIVE METABOLIC PANEL - Abnormal; Notable for the following components:   Glucose, Bld 123 (*)    BUN 25 (*)    All other components within normal limits  LIPASE, BLOOD  URINALYSIS, ROUTINE W REFLEX MICROSCOPIC  TROPONIN I (HIGH SENSITIVITY)  TROPONIN I (HIGH SENSITIVITY)    EKG None  Radiology DG Chest 2 View Result Date: 10/04/2023 CLINICAL DATA:  Chest pain for 3 weeks. EXAM: CHEST - 2 VIEW COMPARISON:  Two-view chest x-ray 01/17/2016. FINDINGS: The heart size is normal. Changes of COPD are noted. No edema or effusion is present. No focal airspace disease is present. The visualized soft tissues and bony thorax are unremarkable. IMPRESSION: COPD without acute cardiopulmonary disease. Electronically Signed   By: Marin Roberts M.D.   On: 10/04/2023 20:57   CT Renal Stone Study Result Date: 10/04/2023 CLINICAL DATA:  Abdominal and flank pain, nephrolithiasis EXAM: CT ABDOMEN AND PELVIS WITHOUT CONTRAST TECHNIQUE: Multidetector CT imaging of the abdomen and pelvis was performed  following the standard protocol without IV contrast. RADIATION DOSE REDUCTION: This exam was performed according to the departmental dose-optimization program which includes automated exposure control, adjustment of the mA and/or kV according to patient size and/or use of iterative reconstruction technique. COMPARISON:  None Available. FINDINGS: Lower chest: Stable subpleural nodules within the visualized left lower lobe since remote prior examination of 06/01/2011, safely considered benign. No acute abnormality. Hepatobiliary: Cholelithiasis without superimposed pericholecystic inflammatory  change. Liver unremarkable on this noncontrast examination. No intra or extrahepatic biliary ductal dilation. Pancreas: Unremarkable Spleen: Unremarkable Adrenals/Urinary Tract: Stable 2.5 cm right and 1.9 cm left benign adrenal adenomas. No follow-up imaging is recommended for these lesions. The kidneys are normal in position. Previously noted right renal calculi have been evacuated in the interval. Punctate calculi seen within the proximal right ureter may be intramural or periureteric in nature. No definite residual intrarenal or ureteral calculi on the right. Stable asymmetric cortical atrophy involving the lower pole the right kidney. On the left, there has developed mild left hydronephrosis secondary to 2 serial obstructing calculi within the a proximal left ureter measuring 3 mm and 5 mm, migrated from the interpolar region of the left kidney since prior examination. Residual 3 mm nonobstructing calculus noted within the midpole of the left kidney. No additional ureteral calculi. The bladder is unremarkable. Stomach/Bowel: The stomach, small bowel, and large bowel are unremarkable. The appendix is not clearly identified and is likely absent. No free intraperitoneal gas or fluid. Vascular/Lymphatic: Aortic atherosclerosis. No enlarged abdominal or pelvic lymph nodes. Reproductive: The prostate gland is of normal size.  There is mild periprostatic inflammatory stranding which may reflect a superimposed infectious or inflammatory prostatitis. No intraparenchymal or periprostatic fluid collections are identified. Other: Tiny fat containing umbilical hernia. Tiny fat containing left inguinal hernia. Musculoskeletal: No acute or significant osseous findings. IMPRESSION: 1. Interval development of mild left hydronephrosis secondary to 2 serial obstructing calculi within the proximal left ureter measuring 3 mm and 5 mm, migrated from the interpolar region of the left kidney since prior examination. 2. Residual 3 mm nonobstructing calculus within the midpole of the left kidney. 3. Interval evacuation of previously noted right renal calculi. Punctate calculi seen within the proximal right ureter may be intramural or periureteric in nature. No definite residual intrarenal or ureteral calculi on the right. 4. Cholelithiasis. 5. Stable bilateral benign adrenal adenomas. 6. Mild periprostatic inflammatory stranding which may reflect a superimposed infectious or inflammatory prostatitis. No intraparenchymal or periprostatic fluid collections are identified. Aortic Atherosclerosis (ICD10-I70.0). Electronically Signed   By: Helyn Numbers M.D.   On: 10/04/2023 20:51    Procedures Procedures  {Document cardiac monitor, telemetry assessment procedure when appropriate:1}  Medications Ordered in ED Medications  morphine (PF) 4 MG/ML injection 4 mg (4 mg Intravenous Given 10/04/23 1947)  ondansetron (ZOFRAN) injection 4 mg (4 mg Intravenous Given 10/04/23 1946)    ED Course/ Medical Decision Making/ A&P Clinical Course as of 10/04/23 2147  Sun Oct 04, 2023  2115 CP x3 weeks Trop reassuring Stones noted on renal study UA pending, f/u [JR]    Clinical Course User Index [JR] Rolla Flatten, MD   {   Click here for ABCD2, HEART and other calculatorsREFRESH Note before signing :1}                              Medical Decision  Making Amount and/or Complexity of Data Reviewed Labs: ordered. Radiology: ordered.  Risk Prescription drug management.   This patient is a 57 y.o. male who presents to the ED for concern of chest pain, flank pain, this involves an extensive number of treatment options, and is a complaint that carries with it a high risk of complications and morbidity. The emergent differential diagnosis prior to evaluation includes, but is not limited to,  ACS, pericarditis, myocarditis, aortic dissection, PE, pneumothorax, esophageal spasm or rupture, chronic angina, pneumonia,  bronchitis, GERD, reflux/PUD, biliary disease, pancreatitis, costochondritis, anxiety, AAA, renal artery/vein embolism/thrombosis, mesenteric ischemia, pyelonephritis, nephrolithiasis, cystitis, biliary colic, pancreatitis, perforated peptic ulcer, appendicitis, diverticulitis, bowel obstruction, Testicular torsion, Epididymitis  This is not an exhaustive differential.   Past Medical History / Co-morbidities / Social History:  has a past medical history of Adrenal adenoma, Complication of anesthesia, Coronary artery disease, CVA (cerebral vascular accident) (HCC) (10/20/2013), Gallstones, GERD (gastroesophageal reflux disease), Gonorrhea (10/20/2013), Heart attack (HCC) (10/21/2003), Heart murmur, Hematuria (12/19/2015), and History of kidney stones.  Additional history: Chart reviewed. Pertinent results include: had LAD stent placement in 2005 and revision in 2023  Physical Exam: Physical exam performed. The pertinent findings include: left CVA tenderness  Lab Tests: I ordered, and personally interpreted labs.  The pertinent results include:  troponin <2, WBC 10.7. UA pending   Imaging Studies: I ordered imaging studies including CXR, CT renal. I independently visualized and interpreted imaging which showed   CXR: COPD without acute cardiopulmonary disease.   CT:   1. Interval development of mild left hydronephrosis  secondary to 2 serial obstructing calculi within the proximal left ureter measuring 3 mm and 5 mm, migrated from the interpolar region of the left kidney since prior examination. 2. Residual 3 mm nonobstructing calculus within the midpole of the left kidney. 3. Interval evacuation of previously noted right renal calculi. Punctate calculi seen within the proximal right ureter may be intramural or periureteric in nature. No definite residual intrarenal or ureteral calculi on the right. 4. Cholelithiasis. 5. Stable bilateral benign adrenal adenomas. 6. Mild periprostatic inflammatory stranding which may reflect a superimposed infectious or inflammatory prostatitis. No intraparenchymal or periprostatic fluid collections are identified.   I agree with the radiologist interpretation.   Cardiac Monitoring:  The patient was maintained on a cardiac monitor.  My attending physician Dr. Karene Fry viewed and interpreted the cardiac monitored which showed an underlying rhythm of: sinus rhythm, no STEMI. I agree with this interpretation.   Medications: I ordered medication including morphine, zofran, fluids for pain, nausea. Reevaluation of the patient after these medicines showed that the patient improved. I have reviewed the patients home medicines and have made adjustments as needed.   Disposition:  Patients UA and delta troponin pending at shift change. If negative, suspect patient can be discharged home with flomax, pain and nausea meds and urology and cardiology follow-up. However, when I informed the patient of the plan, he began yelling "I know my heart stent is blocked and I will not leave until someone fixes this." Given risk factors, recommend running plan by cardiology to ensure agreement.   Care handoff to Rolla Flatten, MD at shift change.  Please see their note for continued evaluation and dispo.  I discussed this case with my attending physician Dr. Karene Fry who cosigned this note  including patient's presenting symptoms, physical exam, and planned diagnostics and interventions. Attending physician stated agreement with plan or made changes to plan which were implemented.    Final Clinical Impression(s) / ED Diagnoses Final diagnoses:  None    Rx / DC Orders ED Discharge Orders     None

## 2023-10-04 NOTE — ED Triage Notes (Signed)
Pt to the ed from home via ems with a CC of chest pain nausea left arm pain. Pt is also CC flank pain that wraps around to his groin, pt has some blood in urine and feels like he has a kidney stone. Pt had 324 Asprin, 1 nitroglycerin, 50 mcg fentanyl, and 4 of Zofran.  Pt denies sob, loc at this time.

## 2023-10-04 NOTE — ED Provider Notes (Signed)
  Physical Exam  BP (!) 161/99   Pulse (!) 54   Temp 98.1 F (36.7 C) (Oral)   Resp 10   Ht 5\' 10"  (1.778 m)   Wt 80 kg   SpO2 100%   BMI 25.31 kg/m   Physical Exam Vitals reviewed.  Constitutional:      General: He is not in acute distress.    Appearance: Normal appearance. He is not ill-appearing, toxic-appearing or diaphoretic.  Cardiovascular:     Rate and Rhythm: Normal rate and regular rhythm.  Pulmonary:     Effort: Pulmonary effort is normal.  Neurological:     Mental Status: He is alert.     Procedures  Procedures  ED Course / MDM   Clinical Course as of 10/04/23 2118  Wynelle Link Oct 04, 2023  2115 CP x3 weeks Trop reassuring Stones noted on renal study UA pending, f/u [JR]    Clinical Course User Index [JR] Rolla Flatten, MD   Medical Decision Making Amount and/or Complexity of Data Reviewed Labs: ordered. Radiology: ordered. ECG/medicine tests: ordered.  Risk Prescription drug management.   I assumed care of this patient at 9 PM.  Patient presented here for flank pain and 3 weeks of left-sided chest pain.  He does have a history of CAD s/p multiple stents as well as nephrolithiasis.  Patient does have multiple left-sided stones, 2 of which are causing mild obstruction with mild hydronephrosis which were noted on stone study today.  Chest pain workup has been reassuring thus far.  Second troponin pending.  ECG nonischemic.  Urinalysis is still pending.  Plan at the time of signout is to follow-up UA.  Repeat troponin within normal limits and stable at 2.  I independently interpreted the patient's ECG, which is nonischemic.  I did speak with cardiology, Dr. Regino Schultze.  She does not recommend hospital admission at this time for further workup of patient's chest pain.  Patient's pain was treated with single dose of 15 mg of IV Toradol.  On reassessment, he notes improvement in his chest pain after receiving Toradol.  Pain could be related to nephrolithiasis given  this rapid improvement after analgesia.  Urinalysis reviewed and is not consistent with UTI.  I reviewed patient's CT imaging.  There is a noted of findings consistent with infectious versus inflammatory prostatitis.  Patient's UA is not consistent with UTI.  Feel that this likely represents inflammatory prostatitis in the setting of recurrent nephrolithiasis.  The patient is felt to be appropriate for discharge at this time.  Cardiac workup reassuring.  Cardiology not recommending admission for interventions at this time but rather continued outpatient management.  No indication for urology consult as patient does not have any superimposed UTI or signs of sepsis.  He will be discharged with a prescription for Norco and tamsulosin.  He follows with a urologist here locally.  Instructed patient to schedule an appointment as soon as possible with both his urologist and cardiologist tomorrow.  Thorough return precautions were discussed with patient at the time of discharge.  He was discharged in stable condition.       Rolla Flatten, MD 10/05/23 0009    Coral Spikes, DO 10/07/23 [phone]

## 2023-10-19 ENCOUNTER — Ambulatory Visit: Payer: Medicaid Other | Admitting: Student

## 2023-10-19 ENCOUNTER — Encounter: Payer: Self-pay | Admitting: Student

## 2023-10-19 VITALS — BP 134/80 | HR 63 | Ht 70.0 in | Wt 182.0 lb

## 2023-10-19 DIAGNOSIS — R911 Solitary pulmonary nodule: Secondary | ICD-10-CM

## 2023-10-19 DIAGNOSIS — J449 Chronic obstructive pulmonary disease, unspecified: Secondary | ICD-10-CM

## 2023-10-19 DIAGNOSIS — L989 Disorder of the skin and subcutaneous tissue, unspecified: Secondary | ICD-10-CM

## 2023-10-19 MED ORDER — ALBUTEROL SULFATE HFA 108 (90 BASE) MCG/ACT IN AERS: 2.0000 | INHALATION_SPRAY | Freq: Four times a day (QID) | RESPIRATORY_TRACT | 1 refills | Status: AC | PRN

## 2023-10-19 MED ORDER — ANORO ELLIPTA 62.5-25 MCG/ACT IN AEPB: 1.0000 | INHALATION_SPRAY | Freq: Every day | RESPIRATORY_TRACT | 1 refills | Status: DC

## 2023-10-19 NOTE — Patient Instructions (Signed)
It was great to see you! Thank you for allowing me to participate in your care!   I recommend that you always bring your medications to each appointment as this makes it easy to ensure we are on the correct medications and helps [place] not miss when refills are needed.  Our plans for today:  -Please take 1 puff of your Anoro Ellipta inhaler once daily, every day even if you feel better -Please take 2 puffs albuterol every 6 hours as needed when you feel short of breath -I have sent a referral to dermatology, they will call you -I have sent a referral to our pharmacy to conduct pulmonary function testing and assist with inhalers, they will call you -A CT scan was ordered for you, they will call you and schedule -Please schedule appointment to see me in approximately 6 weeks to discuss results of CT scan   Take care and seek immediate care sooner if you develop any concerns. Please remember to show up 15 minutes before your scheduled appointment time!  [name], DO [facility]

## 2023-10-19 NOTE — Assessment & Plan Note (Signed)
Daily symptoms of shortness of breath.  Recent ED visit without concern for ACS.  Multiple imaging showing evidence of COPD.  Now the patient has insurance, plan to start maintenance inhaler.  Additionally will send for PFTs. - Anoro Ellipta 1 puff daily - Albuterol 2 puffs every 6 hours as needed - Referral sent to pharmacy clinic for spirometry and assistance with inhalers

## 2023-10-19 NOTE — Assessment & Plan Note (Signed)
Patient is due for noncontrast CT to evaluate pulmonary nodules.  See last CT imaging results for details.  Pending results, may need further imaging versus intervention. - CT chest without contrast

## 2023-10-19 NOTE — Progress Notes (Signed)
    SUBJECTIVE:   CHIEF COMPLAINT / HPI:   COPD lung nodule Patient is due for repeat CT scan of lung nodules, will order today.  Additionally, patient has insurance, and may be able to afford inhalers.  At her last visit nearly 6 months ago he was unable to afford inhalers, and we had to use generic albuterol.  He has symptoms of shortness of breath daily.  He has been seen in the ED multiple times, workup for ACS was negative this month.  Albuterol helps him breathe.  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  However the lesion has been slowly progressing over the years, and is significantly larger than it was 30 years ago.  He has not had a biopsy, nor has he seen a dermatologist.  OBJECTIVE:   BP 134/80   Pulse 63   Ht 5\' 10"  (1.778 m)   Wt 182 lb (82.6 kg)   SpO2 100%   BMI 26.11 kg/m    General: NAD, pleasant Cardio: RRR, no MRG. Cap Refill <2s. Respiratory: CTAB, normal wob on RA Skin: ~1 cm elliptical ulcer with raised pearly border on left nasolabial fold    ASSESSMENT/PLAN:   Assessment & Plan Chronic obstructive pulmonary disease, unspecified COPD type (HCC) Daily symptoms of shortness of breath.  Recent ED visit without concern for ACS.  Multiple imaging showing evidence of COPD.  Now the patient has insurance, plan to start maintenance inhaler.  Additionally will send for PFTs. - Anoro Ellipta 1 puff daily - Albuterol 2 puffs every 6 hours as needed - Referral sent to pharmacy clinic for spirometry and assistance with inhalers Incidental pulmonary nodule, > 3mm and < 8mm Patient is due for noncontrast CT to evaluate pulmonary nodules.  See last CT imaging results for details.  Pending results, may need further imaging versus intervention. - CT chest without contrast Skin lesion of face Lesion on face present for greater than 30 years, but progressing.   See image above.  Concern for malignancy. - Referral to dermatology for biopsy   [name], DO [facility]

## 2023-10-22 DIAGNOSIS — N200 Calculus of kidney: Secondary | ICD-10-CM | POA: Diagnosis not present

## 2023-10-29 ENCOUNTER — Ambulatory Visit: Payer: Medicaid Other | Admitting: Pharmacist

## 2023-10-29 ENCOUNTER — Encounter: Payer: Self-pay | Admitting: Pharmacist

## 2023-10-29 VITALS — BP 120/71 | HR 65 | Ht 69.88 in | Wt 185.0 lb

## 2023-10-29 DIAGNOSIS — J449 Chronic obstructive pulmonary disease, unspecified: Secondary | ICD-10-CM | POA: Diagnosis not present

## 2023-10-29 NOTE — Progress Notes (Signed)
 Reviewed and agree with Dr Macky Lower plan.

## 2023-10-29 NOTE — Assessment & Plan Note (Addendum)
 Patient with significant smoking history of ~ 120 pack-years of smoking with lung nodules on CT. Spirometry evaluation with pre- and post-bronchodilator reveals normal lung function.  -Reviewed results of pulmonary function tests.  Pt verbalized understanding of results and education.   - Currently on Anoro Ellipta  - Trial off in future TBD by PCP at next visit.

## 2023-10-29 NOTE — Progress Notes (Signed)
   S:       Chief Complaint  Patient presents with   Medication Management    PFT - spirometry   58 y.o. male who presents for diabetes evaluation, education, and management. Patient arrives in good spirits and presents without any assistance.   Patient was referred and last seen by Primary Care Provider, Dr. Howell, on 10/19/2023.   PMH is significant for history of both stroke and heart attacks.  Long-term tobacco use disorder (3 ppd for 26 years) At last visit, PFT evaluation was discussed.    Patient reports breathing has been 9/10.   Medication adherence reported good Patient reports last dose of COPD medications was Yesterday AM ( ~ 24 hours prior) Current COPD medications: Anoro Ellipta  (umeclidinium /vilanterol) Rescue inhaler use frequency: Rarely uses  O: Review of Systems  All other systems reviewed and are negative.    Physical Exam  Vitals:   10/29/23 1106  BP: 120/71  Pulse: 65  SpO2: 98%    mMRC score= >2 See Documentation Flowsheet - CAT/COPD for complete symptom scoring.  See scanned report or Documentation Flowsheet (discrete results - PFTs) for Spirometry results. Patient provided good effort while attempting spirometry.   Lung Age = 84  Patient is participating in a Managed Medicaid Plan:  Yes   A/P: Patient with significant smoking history of ~ 120 pack-years of smoking with lung nodules on CT. Spirometry evaluation with pre- and post-bronchodilator reveals normal lung function.  -Reviewed results of pulmonary function tests.  Pt verbalized understanding of results and education.   - Currently on Anoro Ellipta  - Trial off in future TBD by PCP at next visit.    Written patient instructions provided.   Total time in face to face counseling 31 minutes.    Follow-up:  Pharmacist per PCP at any time in future PCP clinic visit 2 weeks Patient seen with Owens Cowing, PharmD Candidate and Madelaine Darnel, PharmD Candidate.

## 2023-10-29 NOTE — Patient Instructions (Signed)
 It was nice to see you today!   Your lung function test was "normal"   Medication Changes: Continue all other medication the same.

## 2023-11-05 ENCOUNTER — Ambulatory Visit
Admission: RE | Admit: 2023-11-05 | Discharge: 2023-11-05 | Disposition: A | Discharge status: Home / Self Care | Payer: Medicaid Other | Source: Ambulatory Visit | Attending: Family Medicine | Admitting: Family Medicine

## 2023-11-05 DIAGNOSIS — R911 Solitary pulmonary nodule: Secondary | ICD-10-CM

## 2023-11-05 DIAGNOSIS — R918 Other nonspecific abnormal finding of lung field: Secondary | ICD-10-CM | POA: Diagnosis not present

## 2023-11-16 ENCOUNTER — Encounter: Payer: Self-pay | Admitting: Student

## 2023-11-16 ENCOUNTER — Ambulatory Visit: Payer: Medicaid Other | Admitting: Student

## 2023-11-16 VITALS — BP 120/70 | HR 74 | Ht 69.0 in | Wt 184.2 lb

## 2023-11-16 DIAGNOSIS — R0609 Other forms of dyspnea: Secondary | ICD-10-CM | POA: Diagnosis not present

## 2023-11-16 DIAGNOSIS — Z0000 Encounter for general adult medical examination without abnormal findings: Secondary | ICD-10-CM

## 2023-11-16 DIAGNOSIS — Z23 Encounter for immunization: Secondary | ICD-10-CM | POA: Diagnosis not present

## 2023-11-16 DIAGNOSIS — R911 Solitary pulmonary nodule: Secondary | ICD-10-CM

## 2023-11-16 DIAGNOSIS — M25512 Pain in left shoulder: Secondary | ICD-10-CM | POA: Diagnosis not present

## 2023-11-16 NOTE — Patient Instructions (Signed)
It was great to see you! Thank you for allowing me to participate in your care!   I recommend that you always bring your medications to each appointment as this makes it easy to ensure we are on the correct medications and helps [place] not miss when refills are needed.  Our plans for today:  - I have sent a referral for colonoscopy  - Please stop taking your inhaler medication (ANORO) since it does not help you and your testing does not show COPD - I will resend a dermatology referral to find a practice that will accept your insurance  - You can continue to use ice or heat on your shoulder and tylenol as needed for pain. If you do not improve, we can consider physical therapy - We will repeat CT of your lungs in 18-24 months - You received PREVNAR 20, which is a pneumonia vaccine - Please go to your eye doctor for eye exam  Take care and seek immediate care sooner if you develop any concerns. Please remember to show up 15 minutes before your scheduled appointment time!  [name], DO [facility]

## 2023-11-16 NOTE — Assessment & Plan Note (Signed)
Spirometry negative for COPD.  Suspect this is related to his coronary artery disease, for she has had stent. -Removed COPD from problem list.  Discontinue inhalers. - Continue metoprolol 25 mg daily, aspirin he 1 mg daily, Plavix 75 mg daily, Imdur as needed for chest pain - Recommend follow-up with cardiologist

## 2023-11-16 NOTE — Progress Notes (Signed)
    SUBJECTIVE:   CHIEF COMPLAINT / HPI:   Lung nodules  COPD Reviewed CT chest, nodules were stable-recommendation for repeat CT in 18-24 months.  Patient expressed understanding.  Reviewed spirometry completed by Dr. Raymondo Band.  Does not have obstructive pathology, and reports minimal improvement with inhalers.  Discussed discontinue inhalers today.  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  Reports increased pain in left shoulder in the last few weeks after several months of sedentary lifestyle.  Pain is worse with overhead and behind the back motions.  Denies trauma to shoulder.  Takes Tylenol, without improvement.  Given CAD and DAPT, not a good candidate for NSAIDs.  Ulcer on face Patient reports that dermatologist called him and told him they would not take his insurance.  He is requesting a new referral.  Message was sent to our referral coordinator.  Health maintenance Agreeable to colonoscopy.  Agreeable to Prevnar 20 today.  OBJECTIVE:   BP 120/70   Pulse 74   Ht 5\' 9"  (1.753 m)   Wt 184 lb 3.2 oz (83.6 kg)   SpO2 97%   BMI 27.20 kg/m    General: NAD, pleasant Cardio: RRR, no MRG. Cap Refill <2s. Respiratory: CTAB, normal wob on RA GI: Abdomen is soft, not tender, not distended. BS present Shoulder: No gross deformity, no ecchymosis, no swelling.  No TTP.  Full range of motion.  Crepitus felt with passive range of motion.  4/5 strength with resisted abduction and internal rotation. Empty can positive Hawkins positive O'Brien's negative Yergason negative Adduction test negative Skin: Warm and dry  ASSESSMENT/PLAN:   Assessment & Plan Acute pain of left shoulder Given history and exam findings suspect rotator cuff tendinopathy.  Patient declines physical therapy and other management.  Patient prefers to " baby the shoulder a little longer." - Follow-up in 6-8 weeks Incidental pulmonary nodule, > 3mm and < 8mm Stable nodules.  Repeat in  18-24 months. Exertional dyspnea Spirometry negative for COPD.  Suspect this is related to his coronary artery disease, for she has had stent. -Removed COPD from problem list.  Discontinue inhalers. - Continue metoprolol 25 mg daily, aspirin he 1 mg daily, Plavix 75 mg daily, Imdur as needed for chest pain - Recommend follow-up with cardiologist Health care maintenance -Amatory referral to GI for colonoscopy - Received Prevnar 20 today    [name], DO [facility]

## 2023-11-16 NOTE — Assessment & Plan Note (Signed)
Stable nodules.  Repeat in 18-24 months.

## 2023-12-22 ENCOUNTER — Ambulatory Visit: Payer: Medicaid Other | Admitting: Dermatology

## 2024-01-11 ENCOUNTER — Ambulatory Visit: Payer: Self-pay | Admitting: Student

## 2024-01-11 ENCOUNTER — Encounter: Payer: Self-pay | Admitting: Student

## 2024-01-11 VITALS — BP 123/98 | HR 64 | Ht 70.5 in | Wt 191.4 lb

## 2024-01-11 DIAGNOSIS — Z1211 Encounter for screening for malignant neoplasm of colon: Secondary | ICD-10-CM

## 2024-01-11 DIAGNOSIS — H9201 Otalgia, right ear: Secondary | ICD-10-CM

## 2024-01-11 DIAGNOSIS — I10 Essential (primary) hypertension: Secondary | ICD-10-CM

## 2024-01-11 NOTE — Progress Notes (Signed)
    SUBJECTIVE:   CHIEF COMPLAINT / HPI:   Intermittent otalgia Not currently painful.  Pain is located in inner ear.  No drainage, no fevers, no cough nor sore throat.  Reports needing dental crowns, and since then has had some intermittent pain.  Denies symptoms of TMJ.  Sometimes pain improves with dental hygiene such as flossing his teeth on that side.  Hypertension Not currently on medications.  Reported he was intolerant of lisinopril and metoprolol due to sexual dysfunction.  No longer taking his metoprolol.  Need for colon cancer screening Agreeable to Cologuard. Discussed that a positive screening would require colonoscopy and patient expressed understanding and agreement with plan.  OBJECTIVE:   BP (!) 123/98   Pulse 64   Ht 5' 10.5" (1.791 m)   Wt 191 lb 6.4 oz (86.8 kg)   SpO2 100%   BMI 27.07 kg/m    General: NAD, pleasant HEENT: Normocephalic, atraumatic head. Normal external ear, canal, TM bilaterally. EOM intact and normal conjunctiva BL. Normal external nose. Throat not erythematous, no exudate, no deviation. Cardio: RRR, no MRG. Cap Refill <2s. Respiratory: CTAB, normal wob on RA GI: Abdomen is soft, not tender, not distended. BS present Skin: Warm and dry  ASSESSMENT/PLAN:   Assessment & Plan Otalgia of right ear Benign exam. Currently resolved.  Sometimes improves with dental hygiene, may be related to teeth and recommended follow-up with his dentist. - Continue dental hygiene - Follow-up if pain worsens or changes Essential hypertension Elevated diastolic blood pressure on repeat. Discussed starting medication today, potentially amlodipine patient requested to defer medication at this time in favor of blood pressure recheck in 2 weeks. - Follow-up in 2 weeks - If elevated would likely start amlodipine Colon cancer screening Agreed to Cologuard. - Cologuard  Follow-up recommendations Reports he is only taking half a tablet of his Plavix, states that  this makes his blood " thick."  Discussed that Plavix was recommended by his cardiologist after his stents.   recommend he take his full tablet, and follow-up with cardiology if he has questions.  [name], DO [facility]

## 2024-01-11 NOTE — Patient Instructions (Addendum)
 It was great to see you! Thank you for allowing me to participate in your care!   I recommend that you always bring your medications to each appointment as this makes it easy to ensure we are on the correct medications and helps [place] not miss when refills are needed.  Our plans for today:  - You will receive cologuard in the mail, they will have instructions - If your blood pressure is still elevated, make appointment in 2 weeks - Please schedule an appointment for your annual exam, you can ask the front when you are due  Take care and seek immediate care sooner if you develop any concerns. Please remember to show up 15 minutes before your scheduled appointment time!  [name], DO [facility]

## 2024-01-11 NOTE — Assessment & Plan Note (Signed)
 Elevated diastolic blood pressure on repeat. Discussed starting medication today, potentially amlodipine patient requested to defer medication at this time in favor of blood pressure recheck in 2 weeks. - Follow-up in 2 weeks - If elevated would likely start amlodipine

## 2024-01-12 ENCOUNTER — Ambulatory Visit (INDEPENDENT_AMBULATORY_CARE_PROVIDER_SITE_OTHER): Admitting: Dermatology

## 2024-01-12 ENCOUNTER — Encounter: Payer: Self-pay | Admitting: Dermatology

## 2024-01-12 VITALS — BP 125/79

## 2024-01-12 DIAGNOSIS — B079 Viral wart, unspecified: Secondary | ICD-10-CM

## 2024-01-12 DIAGNOSIS — C44311 Basal cell carcinoma of skin of nose: Secondary | ICD-10-CM | POA: Diagnosis not present

## 2024-01-12 DIAGNOSIS — D492 Neoplasm of unspecified behavior of bone, soft tissue, and skin: Secondary | ICD-10-CM

## 2024-01-12 DIAGNOSIS — D485 Neoplasm of uncertain behavior of skin: Secondary | ICD-10-CM

## 2024-01-12 DIAGNOSIS — B078 Other viral warts: Secondary | ICD-10-CM | POA: Diagnosis not present

## 2024-01-12 NOTE — Progress Notes (Signed)
   New Patient Visit   Subjective  Leon Allen is a 58 y.o. male who presents for the following: Spot of left face that has been there several years. He had a racoon bite him when he was 15 and he says the racoon's tooth broke off in his cheek. He was told a few years ago that he had an ingrown hair in the area. He also has a spot of his left forearm that has been there for years that comes and goes. If he doesn't keep it covered, it gets hard and crusty. No history of skin cancer. Not sure about family history of skin cancer but they have had other cancers.  The following portions of the chart were reviewed this encounter and updated as appropriate: medications, allergies, medical history  Review of Systems:  No other skin or systemic complaints except as noted in HPI or Assessment and Plan.  Objective  Well appearing patient in no apparent distress; mood and affect are within normal limits.   A focused examination was performed of the following areas:   Relevant exam findings are noted in the Assessment and Plan.  Left Forearm 2.0 x 1.5 cm hyperkeratotic plaque  Left nasolabial fold 1.5 x 0.8 cm ulcerated pink plaque   Assessment & Plan   NEOPLASM OF UNCERTAIN BEHAVIOR OF SKIN (2) Left Forearm Skin / nail biopsy Type of biopsy: tangential   Informed consent: discussed and consent obtained   Timeout: patient name, date of birth, surgical site, and procedure verified   Procedure prep:  Patient was prepped and draped in usual sterile fashion Prep type:  Isopropyl alcohol Anesthesia: the lesion was anesthetized in a standard fashion   Anesthetic:  1% lidocaine w/ epinephrine 1-100,000 buffered w/ 8.4% NaHCO3 Instrument used: flexible razor blade   Hemostasis achieved with: pressure, aluminum chloride and electrodesiccation   Outcome: patient tolerated procedure well   Post-procedure details: sterile dressing applied and wound care instructions given   Dressing type:  bandage and petrolatum   Specimen 1 - Surgical pathology Differential Diagnosis: R/O NMSC  Check Margins: No Left nasolabial fold Skin / nail biopsy Type of biopsy: tangential   Informed consent: discussed and consent obtained   Timeout: patient name, date of birth, surgical site, and procedure verified   Procedure prep:  Patient was prepped and draped in usual sterile fashion Prep type:  Isopropyl alcohol Anesthesia: the lesion was anesthetized in a standard fashion   Anesthetic:  1% lidocaine w/ epinephrine 1-100,000 buffered w/ 8.4% NaHCO3 Instrument used: flexible razor blade   Hemostasis achieved with: pressure, aluminum chloride and electrodesiccation   Outcome: patient tolerated procedure well   Post-procedure details: sterile dressing applied and wound care instructions given   Dressing type: bandage and petrolatum   Specimen 2 - Surgical pathology Differential Diagnosis: R/O NMSC  Check Margins: No  Return for Follow up as scheduled.  I, Joanie Coddington, CMA, am acting as scribe for Gwenith Daily, MD .   Documentation: I have reviewed the above documentation for accuracy and completeness, and I agree with the above.  Gwenith Daily, MD

## 2024-01-12 NOTE — Progress Notes (Deleted)
 Leon Allen

## 2024-01-12 NOTE — Patient Instructions (Addendum)

## 2024-01-13 LAB — SURGICAL PATHOLOGY

## 2024-01-25 ENCOUNTER — Ambulatory Visit: Admitting: Student

## 2024-01-25 ENCOUNTER — Encounter: Payer: Self-pay | Admitting: Student

## 2024-01-25 VITALS — BP 120/80 | HR 53 | Ht 70.0 in | Wt 189.1 lb

## 2024-01-25 DIAGNOSIS — I10 Essential (primary) hypertension: Secondary | ICD-10-CM

## 2024-01-25 NOTE — Progress Notes (Signed)
    SUBJECTIVE:   CHIEF COMPLAINT / HPI:   Essential hypertension Elevated blood pressure reading at last visit.  Carries a diagnosis of essential hypertension. This is a 2-week follow-up.  Not currently on blood pressure medications.  Blood pressure is normotensive in office.  He is otherwise asymptomatic.  Health maintenance Received Cologuard in mail this week, plans to complete and send.  Received tetanus and zoster shot at pharmacy.   OBJECTIVE:   BP 120/80   Pulse (!) 53   Ht 5\' 10"  (1.778 m)   Wt 189 lb 2 oz (85.8 kg)   SpO2 99%   BMI 27.14 kg/m    General: NAD, pleasant Cardio: RRR, no MRG. Cap Refill <2s. Respiratory: CTAB, normal wob on RA GI: Abdomen is soft, not tender, not distended. BS present Skin: Warm and dry  ASSESSMENT/PLAN:   Assessment & Plan Essential hypertension Well-controlled, not on medication.  Improved physical activity likely contributing to control.  Using shared decision making, will not start antihypertensives.  Of note, historically patient has had heart rates between 50-70, he is asymptomatic. - Continue to monitor - Follow-up for annual exam in July 2025, obtain blood work then    [name], DO [facility]

## 2024-01-25 NOTE — Assessment & Plan Note (Signed)
 Well-controlled, not on medication.  Improved physical activity likely contributing to control.  Using shared decision making, will not start antihypertensives.  Of note, historically patient has had heart rates between 50-70, he is asymptomatic. - Continue to monitor - Follow-up for annual exam in July 2025, obtain blood work then

## 2024-01-26 ENCOUNTER — Encounter: Payer: Self-pay | Admitting: Dermatology

## 2024-02-01 ENCOUNTER — Encounter (HOSPITAL_COMMUNITY): Payer: Self-pay

## 2024-02-01 ENCOUNTER — Encounter: Payer: Self-pay | Admitting: Dermatology

## 2024-02-01 ENCOUNTER — Other Ambulatory Visit: Payer: Self-pay

## 2024-02-01 ENCOUNTER — Emergency Department (HOSPITAL_COMMUNITY)
Admission: EM | Admit: 2024-02-01 | Discharge: 2024-02-01 | Disposition: A | Discharge status: Home / Self Care | Attending: Emergency Medicine | Admitting: Emergency Medicine

## 2024-02-01 ENCOUNTER — Emergency Department (HOSPITAL_COMMUNITY)

## 2024-02-01 ENCOUNTER — Ambulatory Visit (INDEPENDENT_AMBULATORY_CARE_PROVIDER_SITE_OTHER): Admitting: Dermatology

## 2024-02-01 VITALS — BP 135/86 | HR 64 | Temp 98.3°F

## 2024-02-01 DIAGNOSIS — R0902 Hypoxemia: Secondary | ICD-10-CM | POA: Diagnosis not present

## 2024-02-01 DIAGNOSIS — R55 Syncope and collapse: Secondary | ICD-10-CM

## 2024-02-01 DIAGNOSIS — R112 Nausea with vomiting, unspecified: Secondary | ICD-10-CM | POA: Diagnosis not present

## 2024-02-01 DIAGNOSIS — L814 Other melanin hyperpigmentation: Secondary | ICD-10-CM | POA: Diagnosis not present

## 2024-02-01 DIAGNOSIS — Z7902 Long term (current) use of antithrombotics/antiplatelets: Secondary | ICD-10-CM | POA: Insufficient documentation

## 2024-02-01 DIAGNOSIS — C44319 Basal cell carcinoma of skin of other parts of face: Secondary | ICD-10-CM

## 2024-02-01 DIAGNOSIS — Z7982 Long term (current) use of aspirin: Secondary | ICD-10-CM | POA: Insufficient documentation

## 2024-02-01 DIAGNOSIS — C4491 Basal cell carcinoma of skin, unspecified: Secondary | ICD-10-CM

## 2024-02-01 DIAGNOSIS — R42 Dizziness and giddiness: Secondary | ICD-10-CM | POA: Diagnosis not present

## 2024-02-01 DIAGNOSIS — L579 Skin changes due to chronic exposure to nonionizing radiation, unspecified: Secondary | ICD-10-CM

## 2024-02-01 DIAGNOSIS — R569 Unspecified convulsions: Secondary | ICD-10-CM | POA: Insufficient documentation

## 2024-02-01 LAB — BASIC METABOLIC PANEL WITH GFR
Anion gap: 9 (ref 5–15)
BUN: 18 mg/dL (ref 6–20)
CO2: 24 mmol/L (ref 22–32)
Calcium: 8.8 mg/dL — ABNORMAL LOW (ref 8.9–10.3)
Chloride: 106 mmol/L (ref 98–111)
Creatinine, Ser: 0.89 mg/dL (ref 0.61–1.24)
GFR, Estimated: >60 mL/min (ref >60)
Glucose, Bld: 125 mg/dL — ABNORMAL HIGH (ref 70–99)
Potassium: 3.8 mmol/L (ref 3.5–5.1)
Sodium: 139 mmol/L (ref 135–145)

## 2024-02-01 LAB — CBC WITH DIFFERENTIAL/PLATELET
Abs Immature Granulocytes: 0.12 10*3/uL — ABNORMAL HIGH (ref 0.00–0.07)
Basophils Absolute: 0.1 10*3/uL (ref 0.0–0.1)
Basophils Relative: 0 %
Eosinophils Absolute: 0 10*3/uL (ref 0.0–0.5)
Eosinophils Relative: 0 %
HCT: 41 % (ref 39.0–52.0)
Hemoglobin: 13.8 g/dL (ref 13.0–17.0)
Immature Granulocytes: 1 %
Lymphocytes Relative: 6 %
Lymphs Abs: 0.8 10*3/uL (ref 0.7–4.0)
MCH: 30.9 pg (ref 26.0–34.0)
MCHC: 33.7 g/dL (ref 30.0–36.0)
MCV: 91.7 fL (ref 80.0–100.0)
Monocytes Absolute: 0.5 10*3/uL (ref 0.1–1.0)
Monocytes Relative: 4 %
Neutro Abs: 12.9 10*3/uL — ABNORMAL HIGH (ref 1.7–7.7)
Neutrophils Relative %: 89 %
Platelets: 228 10*3/uL (ref 150–400)
RBC: 4.47 MIL/uL (ref 4.22–5.81)
RDW: 12.6 % (ref 11.5–15.5)
WBC: 14.4 10*3/uL — ABNORMAL HIGH (ref 4.0–10.5)
nRBC: 0 % (ref 0.0–0.2)

## 2024-02-01 LAB — CBG MONITORING, ED: Glucose-Capillary: 122 mg/dL — ABNORMAL HIGH (ref 70–99)

## 2024-02-01 MED ORDER — TRAMADOL HCL 50 MG PO TABS: 50.0000 mg | ORAL_TABLET | Freq: Four times a day (QID) | ORAL | 0 refills | Status: DC | PRN

## 2024-02-01 MED ORDER — ONDANSETRON HCL 4 MG/2ML IJ SOLN
4.0000 mg | Freq: Once | INTRAMUSCULAR | Status: AC
  Administered 2024-02-01: 4 mg via INTRAVENOUS
  Filled 2024-02-01: qty 2

## 2024-02-01 MED ORDER — SODIUM CHLORIDE 0.9 % IV BOLUS
1000.0000 mL | Freq: Once | INTRAVENOUS | Status: AC
  Administered 2024-02-01: 1000 mL via INTRAVENOUS

## 2024-02-01 MED ORDER — METOCLOPRAMIDE HCL 5 MG/ML IJ SOLN
5.0000 mg | Freq: Once | INTRAMUSCULAR | Status: AC
  Administered 2024-02-01: 5 mg via INTRAVENOUS
  Filled 2024-02-01: qty 2

## 2024-02-01 NOTE — ED Triage Notes (Signed)
 Pt also given 4mg  zofran by EMS

## 2024-02-01 NOTE — ED Provider Notes (Signed)
 [place] EMERGENCY DEPARTMENT AT [facility] Provider Note   CSN: [id number] Arrival date & time: 02/01/24  1707     History  Chief Complaint  Patient presents with   Seizures    EVERHETT BOZARD is a 58 y.o. male.  HPI   59 year old male presents emergency department with possible seizure-like activity.  Patient was at his doctor's office today having Mohs procedure done on his nose.  He had multiple injections of lidocaine for nerve blocks.  Towards the end of the procedure the patient states that when they were injecting deep for the nerve block he started to feel nauseous to his stomach.  When they tried to sit him up at that time he lost consciousness.  There was reported brief generalized shaking and possible urinary incontinence.  Patient has never had a seizure before.  EMS was initially called but the patient declined.  Following this he had an episode of vomiting and EMS was called and he excepted transfer to the ER.  Currently he feels nauseous and fatigued but denies any chest pain, shortness of breath.  There was no tongue biting or head injury.  Home Medications Prior to Admission medications   Medication Sig Start Date End Date Taking? Authorizing Provider  albuterol (VENTOLIN HFA) 108 (90 Base) MCG/ACT inhaler Inhale 2 puffs into the lungs every 6 (six) hours as needed for wheezing or shortness of breath. 10/19/23   [name], DO  aspirin EC 81 MG tablet Take 1 tablet (81 mg total) by mouth daily. Swallow whole. 07/07/23   Christell Constant, MD  clopidogrel (PLAVIX) 75 MG tablet Take 75 mg by mouth daily.    [provider]  nitroGLYCERIN (NITROSTAT) 0.4 MG SL tablet Place 1 tablet (0.4 mg total) under the tongue every 5 (five) minutes as needed for chest pain. 07/07/22   Riley Lam A, MD  pantoprazole (PROTONIX) 20 MG tablet TAKE 1 TABLET BY MOUTH EVERY DAY 02/10/23   Carlos Levering, NP  rosuvastatin (CRESTOR) 5 MG tablet  TAKE 1 TABLET (5 MG TOTAL) BY MOUTH DAILY. 08/31/23   Chandrasekhar, Rondel Jumbo, MD  traMADol (ULTRAM) 50 MG tablet Take 1 tablet (50 mg total) by mouth every 6 (six) hours as needed for up to 10 doses. 02/01/24   Gwenith Daily, MD      Allergies    Bee venom, Penicillins, and Atorvastatin    Review of Systems   Review of Systems  Constitutional:  Positive for chills and fatigue. Negative for fever.  Respiratory:  Negative for shortness of breath.   Cardiovascular:  Negative for chest pain.  Gastrointestinal:  Positive for nausea and vomiting. Negative for abdominal pain and diarrhea.  Skin:  Negative for rash.  Neurological:  Positive for seizures and syncope. Negative for headaches.    Physical Exam Updated Vital Signs BP 127/82   Pulse 68   Temp 98 F (36.7 C) (Axillary)   Resp 15   Ht 5\' 10"  (1.778 m)   Wt 81.6 kg   SpO2 98%   BMI 25.83 kg/m  Physical Exam Vitals and nursing note reviewed.  Constitutional:      General: He is not in acute distress.    Appearance: Normal appearance.  HENT:     Head: Normocephalic.     Nose:     Comments: Large nasal dressing from previous Mohs surgery, no active bleeding    Mouth/Throat:     Mouth: Mucous membranes are moist.  Eyes:  Pupils: Pupils are equal, round, and reactive to light.  Cardiovascular:     Rate and Rhythm: Normal rate.  Pulmonary:     Effort: Pulmonary effort is normal. No respiratory distress.  Abdominal:     Palpations: Abdomen is soft.     Tenderness: There is no abdominal tenderness.  Skin:    General: Skin is warm.  Neurological:     General: No focal deficit present.     Mental Status: He is alert and oriented to person, place, and time. Mental status is at baseline.  Psychiatric:        Mood and Affect: Mood normal.     ED Results / Procedures / Treatments   Labs (all labs ordered are listed, but only abnormal results are displayed) Labs Reviewed  CBC WITH DIFFERENTIAL/PLATELET - Abnormal;  Notable for the following components:      Result Value   WBC 14.4 (*)    Neutro Abs 12.9 (*)    Abs Immature Granulocytes 0.12 (*)    All other components within normal limits  BASIC METABOLIC PANEL WITH GFR - Abnormal; Notable for the following components:   Glucose, Bld 125 (*)    Calcium 8.8 (*)    All other components within normal limits  CBG MONITORING, ED - Abnormal; Notable for the following components:   Glucose-Capillary 122 (*)    All other components within normal limits    EKG EKG Interpretation Date/Time:  Monday February 01 2024 21:32:12 EDT Ventricular Rate:  65 PR Interval:  156 QRS Duration:  88 QT Interval:  394 QTC Calculation: 410 R Axis:   -18  Text Interpretation: Sinus rhythm Borderline left axis deviation Borderline T abnormalities, diffuse leads Confirmed by Coralee Pesa [phone]) on 02/01/2024 9:34:45 PM  Radiology CT Head Wo Contrast Result Date: 02/01/2024 CLINICAL DATA:  Seizure, new-onset, history of trauma EXAM: CT HEAD WITHOUT CONTRAST TECHNIQUE: Contiguous axial images were obtained from the base of the skull through the vertex without intravenous contrast. RADIATION DOSE REDUCTION: This exam was performed according to the departmental dose-optimization program which includes automated exposure control, adjustment of the mA and/or kV according to patient size and/or use of iterative reconstruction technique. COMPARISON:  None Available. FINDINGS: Brain: Mild atrophy. No acute intracranial abnormality. Specifically, no hemorrhage, hydrocephalus, mass lesion, acute infarction, or significant intracranial injury. Vascular: No hyperdense vessel or unexpected calcification. Skull: No acute calvarial abnormality. Sinuses/Orbits: No acute findings Other: None IMPRESSION: No acute intracranial abnormality.  Mild atrophy. Electronically Signed   By: Charlett Nose M.D.   On: 02/01/2024 21:29    Procedures Procedures    Medications Ordered in ED Medications   ondansetron (ZOFRAN) injection 4 mg (4 mg Intravenous Given 02/01/24 1810)  sodium chloride 0.9 % bolus 1,000 mL (0 mLs Intravenous Stopped 02/01/24 2134)  metoCLOPramide (REGLAN) injection 5 mg (5 mg Intravenous Given 02/01/24 1936)    ED Course/ Medical Decision Making/ A&P                                 Medical Decision Making Amount and/or Complexity of Data Reviewed Labs: ordered. Radiology: ordered.  Risk Prescription drug management.   58 year old male presents emergency department after possible seizure-like activity while receiving Mohs procedure on his nose at his surgical office.  It is reported that they were performing a nerve block when he became nauseous, they sat him up and he lost consciousness.  There was  reported brief shaking and possible urinary incontinence.  No history of seizures.  No tongue biting.  He is back to baseline and and feels slightly nauseous but well.  Vitals are normal and stable.  EKG is normal sinus rhythm, unremarkable.  Blood work is reassuring without acute abnormalities.  Head CT shows no focal finding.  No further seizure-like activity here in the department.  I suspect that the patient had a syncopal episode, however urine incontinence could be suspicious for possible seizure-like activity.  For this reason he will be referred outpatient and recommended for EEG and follow-up.  Seizure precautions discussed.  Patient at this time appears safe and stable for discharge and close outpatient follow up. Discharge plan and strict return to ED precautions discussed, patient verbalizes understanding and agreement.        Final Clinical Impression(s) / ED Diagnoses Final diagnoses:  None    Rx / DC Orders ED Discharge Orders     None         Flonnie Humphrey, DO 02/01/24 2311

## 2024-02-01 NOTE — Patient Instructions (Signed)

## 2024-02-01 NOTE — ED Triage Notes (Signed)
 Pt bib ems from doctors office, pt s/p nasal surgery today, pt had 2 episodes of seizure like activity while there with LOC.  No hx of seizures. Pt incontinent of urine. C.o dizziness and n/v.  Pt given fluid PTA

## 2024-02-01 NOTE — ED Notes (Signed)
 Fall band, socks placed on patient, sz pads on, bed lowered, door open, fall sign up.

## 2024-02-01 NOTE — Discharge Instructions (Addendum)
 You have been seen and discharged from the emergency department.  Your head CT was normal.  Your blood work was normal.  The EKG of your heart was normal.  It is possible that you had a syncopal event secondary to the anesthesia and surgical procedure.  It is also possible that you had seizure-like activity.  You need to follow-up as an outpatient with your primary doctor for further evaluation. They can order an EEG and refer to neurology as needed.  The following are seizure precautions that you need to follow.  Do not cook alone.  Take showers and do not take a bath alone for the risk of drowning if you are to have a seizure.  Do not swim alone.  Do not drive until you are cleared by neurology. Do not climb high structures or operate heavy machinery until you are cleared by neurology.  Take home medications as prescribed. If you have any worsening symptoms or further concerns for your health please return to an emergency department for further evaluation.

## 2024-02-01 NOTE — Progress Notes (Unsigned)
 Follow-Up Visit   Subjective  Leon Allen is a 58 y.o. 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 Caralyn Guile.  He also had a wart biopsied on his left forearm, which is healing well.   The following portions of the chart were reviewed this encounter and updated as appropriate: medications, allergies, medical history  Review of Systems:  No other skin or systemic complaints except as noted in HPI or Assessment and Plan.  Objective  Well appearing patient in no apparent distress; mood and affect are within normal limits.  A focused examination was performed of the following areas: Left nasolabial fold Relevant physical exam findings are noted in the Assessment and Plan.     Assessment & Plan   BASAL CELL CARCINOMA (BCC), UNSPECIFIED SITE left nasolabial fold Mohs surgery  Consent obtained: written  Anticoagulation: Was the anticoagulation regimen changed prior to Mohs? No    Procedure Details: Timeout: pre-procedure verification complete Procedure Prep: patient was prepped and draped in usual sterile fashion Pre-Op diagnosis: basal cell carcinoma Surgical site (from skin exam): left nasolabial fold Pre-operative length (cm): 1.4 Pre-operative width (cm): 0.9  Micrographic Surgery Details: Post-operative length (cm): 3.3 Post-operative width (cm): 3.2 Number of Mohs stages: 4  Skin repair Complexity:  Complex Final length (cm):  5.5 (x 7.2) Subcutaneous layers (deep stitches):  Suture size:  4-0 and 5-0 Suture type: Vicryl (polyglactin 910) and Monocryl (poliglecaprone 25)   Fine/surface layer approximation (top stitches):  Suture size:  5-0 Suture type: Prolene (polypropylene)     Return in about 1 week (around 02/08/2024) for wound check.  Owens Shark, CMA, am acting as scribe for Gwenith Daily, MD.    02/01/2024  HISTORY OF PRESENT ILLNESS  Leon Allen is seen in  consultation at the request of Dr. Caralyn Guile for biopsy-proven Nodular Basal Cell Carcinoma on the left nasolabial fold. They note that the area has been present for about 3 years increasing in size with time.  There is no history of previous treatment.  Reports no other new or changing lesions and has no other complaints today.  Medications and allergies: see patient chart.  Review of systems: Reviewed 8 systems and notable for the above skin cancer.  All other systems reviewed are unremarkable/negative, unless noted in the HPI. Past medical history, surgical history, family history, social history were also reviewed and are noted in the chart/questionnaire.    PHYSICAL EXAMINATION  General: Well-appearing, in no acute distress, alert and oriented x 4. Vitals reviewed in chart (if available).   Skin: Exam reveals a 1.4 x 0.9 cm erythematous papule and biopsy scar on the left nasolabial fold. There are rhytids, telangiectasias, and lentigines, consistent with photodamage.   Biopsy report(s) reviewed, confirming the diagnosis.   ASSESSMENT  1) Nodular Basal Cell Carcinoma on the left nasolabial fold 2) photodamage 3) solar lentigines   PLAN   1. Due to location, size, histology, or recurrence and the likelihood of subclinical extension as well as the need to conserve normal surrounding tissue, the patient was deemed acceptable for Mohs micrographic surgery (MMS).  The nature and purpose of the procedure, associated benefits and risks including recurrence and scarring, possible complications such as pain, infection, and bleeding, and alternative methods of treatment if appropriate were discussed with the patient during consent. The lesion location was verified by the patient, by reviewing previous notes, pathology reports, and by photographs  as well as angulation measurements if available.  Informed consent was reviewed and signed by the patient, and timeout was performed at 9:30 AM. See op note  below.  2. For the photodamage and solar lentigines, sun protection discussed/information given on OTC sunscreens, and we recommend continued regular follow-up with primary dermatologist every 6 months or sooner for any growing, bleeding, or changing lesions. 3. Prognosis and future surveillance discussed. 4. Letter with treatment outcome sent to referring provider. 5. Pain***   MOHS MICROGRAPHIC SURGERY AND RECONSTRUCTION  Initial size:   1.4 x 0.9 cm Surgical defect/wound size: *** cm Anesthesia:    0.33% lidocaine with 1:200,000 epinephrine EBL:    <5 mL Complications:  None Repair type:   *** SQ suture:   5-0 Monocryl Cutaneous suture:  6-0 Plain gut Final size of the repair: *** cm  Stages:  ***  Reconstruction***   Documentation: I have reviewed the above documentation for accuracy and completeness, and I agree with the above.  Deneise Finlay, MD

## 2024-02-02 ENCOUNTER — Encounter: Payer: Self-pay | Admitting: Dermatology

## 2024-02-03 ENCOUNTER — Other Ambulatory Visit: Payer: Self-pay | Admitting: Student

## 2024-02-08 ENCOUNTER — Encounter: Payer: Self-pay | Admitting: Dermatology

## 2024-02-08 ENCOUNTER — Ambulatory Visit (INDEPENDENT_AMBULATORY_CARE_PROVIDER_SITE_OTHER): Admitting: Dermatology

## 2024-02-08 VITALS — BP 125/78 | HR 98

## 2024-02-08 DIAGNOSIS — W908XXA Exposure to other nonionizing radiation, initial encounter: Secondary | ICD-10-CM | POA: Diagnosis not present

## 2024-02-08 DIAGNOSIS — L578 Other skin changes due to chronic exposure to nonionizing radiation: Secondary | ICD-10-CM

## 2024-02-08 DIAGNOSIS — C4491 Basal cell carcinoma of skin, unspecified: Secondary | ICD-10-CM

## 2024-02-08 DIAGNOSIS — R55 Syncope and collapse: Secondary | ICD-10-CM | POA: Diagnosis not present

## 2024-02-08 DIAGNOSIS — T1490XD Injury, unspecified, subsequent encounter: Secondary | ICD-10-CM

## 2024-02-08 NOTE — Progress Notes (Signed)
 Follow Up Visit   Subjective  Leon Allen is a 58 y.o. male who presents for the following: follow up from Mohs surgery   The patient presents for follow up from Mohs surgery for a BCC on the left nasolabial fold, treated on 02/01/24, repaired with advancement flap. The patient has been bandaging the wound as directed. The endorse the following concerns: can he shower and blow his nose.   The following portions of the chart were reviewed this encounter and updated as appropriate: medications, allergies, medical history  Review of Systems:  No other skin or systemic complaints except as noted in HPI or Assessment and Plan.  Objective  Well appearing patient in no apparent distress; mood and affect are within normal limits.  A full examination was performed including scalp, head, and face. All findings within normal limits unless otherwise noted below.  Healing wound with mild erythema  Relevant physical exam findings are noted in the Assessment and Plan.    Assessment & Plan   Healing s/p Mohs for [facility], treated on left nasolabial fold, repaired with advancement flap. - Reassured that wound is healing well - No evidence of infection - Sutured removed - No swelling, induration, purulence, dehiscence, or tenderness out of proportion to the clinical exam, see photo above - Discussed that scars take up to 12 months to mature from the date of surgery - Recommend SPF 30+ to scar daily to prevent purple color from UV exposure during scar maturation process - Discussed that erythema and raised appearance of scar will fade over the next 4-6 months - OK to start scar massage at 4-6 weeks post-op - Can consider silicone based products for scar healing starting at 6 weeks post-op - Ok to continue ointment daily to wound under a bandage for another 2-4 weeks or until fully healed.  HISTORY OF BASAL CELL CARCINOMA OF THE SKIN - No evidence of recurrence today - Recommend regular full  body skin exams - Recommend daily broad spectrum sunscreen SPF 30+ to sun-exposed areas, reapply every 2 hours as needed.  - Call if any new or changing lesions are noted between office visits  Vasovagal Episode At the patient's follow-up visit, we discussed the vasovagal episode that occurred during his last appointment. The reaction appeared to be multifactorial, likely triggered by a combination of prolonged surgery, lack of food intake prior to the procedure, and the use of lidocaine . The patient was transported by EMS and responded well to fluids and oral intake shortly after, with no ongoing symptoms reported. He confirmed that he does not have a true allergy to lidocaine --there was no rash, hives, or anaphylaxis--but he may have a sensitivity to it, which should be noted in his medical record. We reviewed the importance of staying well-fed and hydrated before any future procedures to help prevent recurrence of a vasovagal response. The patient is currently doing well and was advised to inform healthcare providers of his lidocaine  sensitivity and to maintain adequate nutrition and hydration prior to future treatments.  ACTINIC DAMAGE - chronic, secondary to cumulative UV radiation exposure/sun exposure over time - diffuse scaly erythematous macules with underlying dyspigmentation - Recommend daily broad spectrum sunscreen SPF 30+ to sun-exposed areas, reapply every 2 hours as needed.  - Recommend staying in the shade or wearing long sleeves, sun glasses (UVA+UVB protection) and wide brim hats (4-inch brim around the entire circumference of the hat). - Call for new or changing lesions.  Return in about 4 weeks (around  03/07/2024) for wound check.  I, Haig Levan, Surg Tech III, am acting as scribe for Deneise Finlay, MD.   Documentation: I have reviewed the above documentation for accuracy and completeness, and I agree with the above.  Deneise Finlay, MD

## 2024-02-08 NOTE — Patient Instructions (Signed)

## 2024-03-07 ENCOUNTER — Telehealth: Payer: Self-pay | Admitting: Student

## 2024-03-07 NOTE — Telephone Encounter (Signed)
 Called patient he stated that he will wait until Dr.Bronson comes back to have it completed.

## 2024-03-07 NOTE — Telephone Encounter (Signed)
 Left a form in your box that the patient needs completed.

## 2024-03-08 ENCOUNTER — Encounter: Payer: Self-pay | Admitting: Dermatology

## 2024-03-08 ENCOUNTER — Ambulatory Visit: Admitting: Dermatology

## 2024-03-08 VITALS — BP 125/79

## 2024-03-08 DIAGNOSIS — B079 Viral wart, unspecified: Secondary | ICD-10-CM

## 2024-03-08 DIAGNOSIS — L905 Scar conditions and fibrosis of skin: Secondary | ICD-10-CM

## 2024-03-08 DIAGNOSIS — C4491 Basal cell carcinoma of skin, unspecified: Secondary | ICD-10-CM

## 2024-03-08 DIAGNOSIS — T1490XD Injury, unspecified, subsequent encounter: Secondary | ICD-10-CM

## 2024-03-08 NOTE — Progress Notes (Signed)
   Follow Up Visit   Subjective  Leon Allen is a 58 y.o. male who presents for the following: follow up from Mohs surgery   The patient presents for follow up from Mohs surgery for a BCC on the left nasolabial, treated on 02/01/2024, repaired with advancement flap. The patient has been bandaging the wound as directed. The endorse the following concerns: He says when he takes a deep breath, his left nostril closes. He also says the biopsy proven verruca of left forearm is starting to come back.  The following portions of the chart were reviewed this encounter and updated as appropriate: medications, allergies, medical history  Review of Systems:  No other skin or systemic complaints except as noted in HPI or Assessment and Plan.  Objective  Well appearing patient in no apparent distress; mood and affect are within normal limits.  A full examination was performed including scalp, head, face and left forearm. All findings within normal limits unless otherwise noted below.  Healing wound with mild erythema  Relevant physical exam findings are noted in the Assessment and Plan.    Left Forearm Healing biopsy site  Assessment & Plan   Healing s/p Mohs for [facility] of left nasolabial, treated on 02/01/2024, repaired with advancement flap - Reassured that wound is healing well - No evidence of infection - No swelling, induration, purulence, dehiscence, or tenderness out of proportion to the clinical exam, see photo above - Discussed that scars take up to 12 months to mature from the date of surgery - Recommend SPF 30+ to scar daily to prevent purple color from UV exposure during scar maturation process - Discussed that erythema and raised appearance of scar will fade over the next 4-6 months - OK to start scar massage now. Recommend massaging scar daily for 5 minutes to help flatten the scar tissue. - Can consider starting silicone based products for scar healing. Information given in his  AVS today. - Discussed cartilage repair for left nostril. Advised him that we can consider a revision surgery 6 months after his surgery. May consider dermabrasion on follow up if needed.  HISTORY OF BASAL CELL CARCINOMA OF THE SKIN - No evidence of recurrence today - Recommend regular full body skin exams - Recommend daily broad spectrum sunscreen SPF 30+ to sun-exposed areas, reapply every 2 hours as needed.  - Call if any new or changing lesions are noted between office visits  VIRAL WARTS, UNSPECIFIED TYPE Left Forearm Destruction of lesion - Left Forearm Complexity: simple   Destruction method: cryotherapy   Informed consent: discussed and consent obtained   Timeout:  patient name, date of birth, surgical site, and procedure verified Lesion destroyed using liquid nitrogen: Yes   Region frozen until ice ball extended beyond lesion: Yes   Outcome: patient tolerated procedure well with no complications   Post-procedure details: wound care instructions given    Return for 4-6 months , Follow up.  I, Eliot Guernsey, CMA, am acting as scribe for Deneise Finlay, MD .   Documentation: I have reviewed the above documentation for accuracy and completeness, and I agree with the above.  Deneise Finlay, MD

## 2024-03-08 NOTE — Patient Instructions (Addendum)
 Post-Operative Scar Care: Education and Recommendations  Following your procedure, it's important to care for your scar to promote optimal healing and minimize its appearance. Proper post-operative care can help ensure that the scar heals well, and with time, it may become less noticeable. Below are key recommendations for scar care, including scar massage and the use of silicone scar gels or sheets.  1. General Scar Care Tips: -  Keep the wound clean and dry: Follow your healthcare provider's instructions for wound care, including cleaning the site and changing dressings as needed. -  Avoid sun exposure: Direct sunlight can darken scars and make them more noticeable. Once your wound has healed, apply sunscreen (SPF 30 or higher) to protect the scar from UV rays.  2. Scar Massage: - Start after healing: Wait until the scar has fully healed, with no scabs or open areas (usually 4-6 weeks after surgery). Your healthcare provider will give you specific guidance on when to begin. - Technique: Gently massage the scar in a circular motion for 5-10 minutes, 2-3 times per day. This helps to soften the tissue, reduce swelling, and improve the overall appearance of the scar. - Pressure: Apply gentle, firm pressure during the massage to break down the dense tissue that may form during healing. This helps to prevent the formation of keloids or hypertrophic scars. - Use lotion or ointment: Consider using a mild, fragrance-free lotion or vitamin E ointment to help lubricate the area during massage.  3. Silicone Scar Gels or Sheets: - When to start: Once your wound has healed completely, typically around 4-6 weeks, you can begin using silicone-based scar gels or sheets. These have been shown to improve scar appearance by hydrating the tissue and reducing inflammation. - How to use silicone gels: Apply a thin layer of the gel to the scar and allow it to dry before covering with clothing. You can use the  gel multiple times a day, depending on your provider's recommendation. - How to use silicone sheets: Cut the sheet to fit the size of your scar, and apply it directly to the healed scar. Wear it for 12-24 hours a day, and replace the sheet every few days as directed. - Benefits: Silicone helps reduce redness, flatten the scar, and improve its texture. Continued use over several months can lead to significant improvement in the appearance of the scar.  4. What to Expect: - Healing process: Scars generally take time to mature. The first few months may show redness or swelling, but this usually improves as healing progresses. - Long-term care: Scarring is a natural part of the healing process. While you cannot completely eliminate a scar, proper care can significantly improve its appearance over time. - Patience: It can take up to a year for a scar to fully mature, so it's important to be consistent with scar care and follow-up appointments with your provider.  5. When to Contact Your Healthcare Provider: - If you notice signs of infection (increased redness, warmth, drainage, or pain). - If your scar becomes unusually raised, itchy, or changes in color significantly. - If you have concerns about the appearance of your scar or experience unusual symptoms. - By following these guidelines, you can support your body's natural healing process and help ensure the best possible outcome for your scar. If you have any questions or concerns, please don't hesitate to contact our office.   Cryotherapy Aftercare  Wash gently with soap and water everyday.   Apply Vaseline  and Band-Aid daily until healed.   Important Information  Due to recent changes in healthcare laws, you may see results of your pathology and/or laboratory studies on MyChart before the doctors have had a chance to review them. We understand that in some cases there may be results that are confusing or concerning to you. Please understand  that not all results are received at the same time and often the doctors may need to interpret multiple results in order to provide you with the best plan of care or course of treatment. Therefore, we ask that you please give [place] 2 business days to thoroughly review all your results before contacting the office for clarification. Should we see a critical lab result, you will be contacted sooner.   If You Need Anything After Your Visit  If you have any questions or concerns for your doctor, please call our main line at [phone] If no one answers, please leave a voicemail as directed and we will return your call as soon as possible. Messages left after 4 pm will be answered the following business day.   You may also send [place] a message via MyChart. We typically respond to MyChart messages within 1-2 business days.  For prescription refills, please ask your pharmacy to contact our office. Our fax number is [phone].  If you have an urgent issue when the clinic is closed that cannot wait until the next business day, you can page your doctor at the number below.    Please note that while we do our best to be available for urgent issues outside of office hours, we are not available 24/7.   If you have an urgent issue and are unable to reach [place], you may choose to seek medical care at your doctor's office, retail clinic, urgent care center, or emergency room.  If you have a medical emergency, please immediately call 911 or go to the emergency department. In the event of inclement weather, please call our main line at [phone] for an update on the status of any delays or closures.  Dermatology Medication Tips: Please keep the boxes that topical medications come in in order to help keep track of the instructions about where and how to use these. Pharmacies typically print the medication instructions only on the boxes and not directly on the medication tubes.   If your medication is too  expensive, please contact our office at [phone] or send [place] a message through MyChart.   We are unable to tell what your co-pay for medications will be in advance as this is different depending on your insurance coverage. However, we may be able to find a substitute medication at lower cost or fill out paperwork to get insurance to cover a needed medication.   If a prior authorization is required to get your medication covered by your insurance company, please allow [place] 1-2 business days to complete this process.  Drug prices often vary depending on where the prescription is filled and some pharmacies may offer cheaper prices.  The website www.goodrx.com contains coupons for medications through different pharmacies. The prices here do not account for what the cost may be with help from insurance (it may be cheaper with your insurance), but the website can give you the price if you did not use any insurance.  - You can print the associated coupon and take it with your prescription to the pharmacy.  - You may also stop by our office during regular business hours and pick  up a GoodRx coupon card.  - If you need your prescription sent electronically to a different pharmacy, notify our office through [facility] or by phone at [phone]

## 2024-03-15 NOTE — Telephone Encounter (Signed)
 Patient is coming in 06/10 @310  seeing Dr.Bronson

## 2024-03-23 ENCOUNTER — Encounter: Payer: Self-pay | Admitting: Dermatology

## 2024-03-29 ENCOUNTER — Ambulatory Visit: Admitting: Student

## 2024-03-29 ENCOUNTER — Encounter: Payer: Self-pay | Admitting: Student

## 2024-03-29 VITALS — BP 127/82 | HR 58 | Ht 70.0 in | Wt 186.0 lb

## 2024-03-29 DIAGNOSIS — Z789 Other specified health status: Secondary | ICD-10-CM | POA: Diagnosis not present

## 2024-03-29 DIAGNOSIS — R29818 Other symptoms and signs involving the nervous system: Secondary | ICD-10-CM

## 2024-03-29 DIAGNOSIS — S0006XA Insect bite (nonvenomous) of scalp, initial encounter: Secondary | ICD-10-CM

## 2024-03-29 NOTE — Patient Instructions (Addendum)
 It was great to see you! Thank you for allowing me to participate in your care!   I recommend that you always bring your medications to each appointment as this makes it easy to ensure we are on the correct medications and helps us  not miss when refills are needed.  Our plans for today:  - I am sending a referral for sleep study - Keep the tick bite clean, do not scratch at it - We will work on [employer] paperwork, this will take 5 business days.  Take care and seek immediate care sooner if you develop any concerns. Please remember to show up 15 minutes before your scheduled appointment time!  Lavada Porteous, DO [facility]

## 2024-03-29 NOTE — Progress Notes (Signed)
    SUBJECTIVE:   CHIEF COMPLAINT / HPI:   Tick bite Patient suffered tick bite on posterior scalp.  Removed tick on his own with a razor.  No longer painful, stopped brightest.  Is concerned for retained insect parts.  Apnea Patient waking from night feeling as though he is not breathing.  Do you snore loudly:1  Do you often feel fatigued or tired or sleepy during the day time: 1  Has anyone observed you stop breathing at night: 1  Do you have high blood pressure: 1  BMI (>35 +1): 0 AGE (>50 +1): 1  Neck circumference (>40 +1): 0 Gender (Male +1): 1  Score 6  FMLA Patient is requesting FMLA paperwork for his daughter.  Patient is on disability.  He has had multiple surgeries recently, and needs help at home intermittently.  I agree he has multiple comorbidities that require assistance intermittently.  Additionally, he recently had seizure-like activity after receiving a Mohs procedure, and has further procedures planned-he would like his daughter to be able to attend these.   OBJECTIVE:   BP 127/82   Pulse (!) 58   Ht 5\' 10"  (1.778 m)   Wt 186 lb (84.4 kg)   SpO2 100%   BMI 26.69 kg/m    General: NAD, pleasant HEENT: Normocephalic, atraumatic head.  Healing skin graft over left nasolabial fold and upper lip.  Small excoriation on posterior scalp, no retained insect parts identified, no drainage, no signs of infection-healing well. Cardio: RRR, no MRG. Cap Refill <2s. Respiratory: CTAB, normal wob on RA GI: Abdomen is soft, not tender, not distended. BS present   ASSESSMENT/PLAN:   Assessment & Plan Insect bite of scalp, initial encounter Healing well.  No signs of infection. - Supportive care Chronic health problem Will write FMLA paperwork for daughter to assist patient at home intermittently Suspected sleep apnea STOP-BANG 6. Patient reported apnea sensation. -Sleep study ordered    Lavada Porteous, DO [facility]

## 2024-04-04 NOTE — Telephone Encounter (Signed)
Form completed, placed in RN triage box 

## 2024-04-05 NOTE — Telephone Encounter (Signed)
 Patient called and informed that forms are ready for pick up. Copy made and placed in batch scanning. Original placed at front desk for pick up.   Veronda Prude, RN

## 2024-05-02 ENCOUNTER — Ambulatory Visit: Admitting: Student

## 2024-05-02 ENCOUNTER — Encounter: Payer: Self-pay | Admitting: Student

## 2024-05-02 VITALS — BP 112/80 | HR 78 | Temp 98.2°F | Ht 70.0 in | Wt 181.5 lb

## 2024-05-02 DIAGNOSIS — R052 Subacute cough: Secondary | ICD-10-CM

## 2024-05-02 DIAGNOSIS — A6920 Lyme disease, unspecified: Secondary | ICD-10-CM | POA: Diagnosis not present

## 2024-05-02 MED ORDER — DOXYCYCLINE HYCLATE 100 MG PO TABS: 100.0000 mg | ORAL_TABLET | Freq: Two times a day (BID) | ORAL | 0 refills | Status: AC

## 2024-05-02 NOTE — Progress Notes (Signed)
 Error

## 2024-05-02 NOTE — Patient Instructions (Addendum)
 It was great to see you! Thank you for allowing me to participate in your care!   I recommend that you always bring your medications to each appointment as this makes it easy to ensure we are on the correct medications and helps us  not miss when refills are needed.  Our plans for today:  - Take 100 mg of Doxycycline  twice daily for 28 days. Please be sure to wear sunscreen or long sleeved clothes while taking this medication. - follow-up in 1 month or sooner if symptoms worsen  An x-ray was ordered for you---you do not need an appointment to have this completed.  I recommend going to [facility] [address] [place]  If the results are normal,I will send you a letter  I will call you with results if anything is abnormal     Take care and seek immediate care sooner if you develop any concerns. Please remember to show up 15 minutes before your scheduled appointment time!  Gladis Church, DO [facility]

## 2024-05-02 NOTE — Progress Notes (Signed)
    SUBJECTIVE:   CHIEF COMPLAINT / HPI:   Tick bite Patient had tick bite several weeks ago.  Now having arthralgias.  Tick bite was on the back of the head, unclear if he had rash or not.  He reports a history of Lyme disease before, which he had a targetoid lesion.  Reports he was given antibiotics.  Reports last time he had Lyme disease symptoms were similar.  Cough and congestion Ongoing cough for several months, worsening over the last couple weeks.  Additionally now has congestion.  Reports exposure to RSV infection.  Normal work of breathing, no fevers, cough is nonproductive.  No sore throat.  No NVD.  Does smoke half a joint per night.  OBJECTIVE:   BP 112/80   Pulse 78   Temp 98.2 F (36.8 C) (Oral)   Ht 5' 10 (1.778 m)   Wt 181 lb 8 oz (82.3 kg)   SpO2 98%   BMI 26.04 kg/m    General: NAD, pleasant Cardio: RRR, no MRG. Cap Refill <2s. Respiratory: Coarse breath sounds/wheezing focal over right lung.  Some improvement with cough. GI: Abdomen is soft, not tender, not distended. BS present Skin: Warm and dry  ASSESSMENT/PLAN:   Assessment & Plan Disseminated Lyme disease Given prior infection, question utility in testing.  Additionally, does have exposure in endemic area. - Empirically treat with doxycycline  100 mg twice daily for 28 days Subacute cough Overall well-appearing, does have focal lung auscultation.  Differential includes: Viral URI, bronchitis, pneumonia, [place].  Previously worked up for COPD, with normal spirometry. - CXR - Smoking cessation   Gladis Church, DO [facility]

## 2024-05-10 ENCOUNTER — Telehealth: Payer: Self-pay | Admitting: Internal Medicine

## 2024-05-10 NOTE — Telephone Encounter (Signed)
 Called and spoke with patient. Patient states he has been having constant heart pain for four weeks since having a Mohs procedure. Patient describes it as not a sharp pain but states it is an ache. States he takes 2-3 Tylenol  in the morning and it helps. States he is taking four baby aspirin  and half of his Plavix  every day. States only taking half of his Plavix  because it thickens his blood up. Patient endorses that nothing seems to make heart aching worse. States being more active makes the heart ache lessen. Denies chest pressure, tightness or shortness of breath. States he keeps nitroglycerine on him but has not taken it and states he does not take it unless he feels like he is having a heart attack. States he always has a headache and is constantly tired. Endorses that he tries to stay hydrated but usually only has about two 8 oz glasses of water  per day. Patient stated that he spends a lot of time on the weekends out in the woods and has had multiple tick bites and is currently taking medication for lyme disease. 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 Advised patient to get evaluated at urgent care or his PCP. Patient states he is not able to go to urgent care today but will go to urgent care tomorrow. Advised patient that if he has new or worsening symptoms to go to the emergency room to be evaluated.

## 2024-05-10 NOTE — Telephone Encounter (Signed)
   Pt c/o of Chest Pain: STAT if active CP, including tightness, pressure, jaw pain, radiating pain to shoulder/upper arm/back, CP unrelieved by Nitro. Symptoms reported of SOB, nausea, vomiting, sweating.  1. Are you having CP right now? For 3 weeks / pt had moes surgery on his face 3  week ago.  He stated he has been having chest pain every since the surgery    2. Are you experiencing any other symptoms (ex. SOB, nausea, vomiting, sweating)? Headache , stay tried , upper chest congestion    3. Is your CP continuous or coming and going? Continuous    4. Have you taken Nitroglycerin ? No    5. How long have you been experiencing CP? 3 weeks     6. If NO CP at time of call then end call with telling Pt to call back or call 911 if Chest pain returns prior to return call from triage team.   Best number [phone]

## 2024-05-11 ENCOUNTER — Encounter (HOSPITAL_COMMUNITY): Payer: Self-pay | Admitting: Emergency Medicine

## 2024-05-11 ENCOUNTER — Ambulatory Visit (HOSPITAL_COMMUNITY)
Admission: EM | Admit: 2024-05-11 | Discharge: 2024-05-11 | Disposition: A | Discharge status: Home / Self Care | Attending: Emergency Medicine | Admitting: Emergency Medicine

## 2024-05-11 ENCOUNTER — Other Ambulatory Visit: Payer: Self-pay

## 2024-05-11 DIAGNOSIS — R079 Chest pain, unspecified: Secondary | ICD-10-CM | POA: Diagnosis not present

## 2024-05-11 HISTORY — DX: Lyme disease, unspecified: A69.20

## 2024-05-11 NOTE — ED Provider Notes (Signed)
 [facility]    CSN: [id number] Arrival date & time: 05/11/24  1017      History   Chief Complaint Chief Complaint  Patient presents with   Cough   Chest Pain   Headache    HPI Leon Allen is a 58 y.o. male.   Patient presents with intermittent  heart pain x 4 weeks.  Patient states that it has become progressively worse each time when it occurs.  Patient does report history of stent placement and states that in the past when he felt like this his stent was blocked.   Patient is requesting testing for Lyme disease due to being bitten by multiple ticks as he does work outside.  Patient is currently on doxycycline  related to this.  Patient wonders if his chest pain could be related to this.  Patient states that he was exposed to RSV about 4 weeks ago and he has had some mild cough and congestion since then and is requesting RSV testing and also wonders if this could be related to his chest pain.  Denies fever or shortness of breath.  Patient states that he did take some Tylenol  couple hours ago and takes aspirin  a couple times a day with some relief.  Patient does have as needed nitroglycerin  prescribed, but states that he has not taken this.  Patient states that he has also had intermittent mild headaches for 3 to 4 weeks as well.  Denies blurred vision, dizziness, weakness, confusion, slurred speech, facial droop.  Past medical history includes coronary artery disease, MI, CVA, hyperlipidemia, Lyme disease, and GERD.  The history is provided by the patient and medical records.  Cough Associated symptoms: chest pain and headaches   Chest Pain Associated symptoms: cough and headache   Headache Associated symptoms: cough     Past Medical History:  Diagnosis Date   Adrenal adenoma    bilateral   Complication of anesthesia    @ dentist office; was overdosed   Coronary artery disease    CVA (cerebral vascular accident) (HCC) 10/20/2013   denies residual  on 01/17/2016   Gallstones    GERD (gastroesophageal reflux disease)    Gonorrhea 10/20/2013   Heart attack (HCC) 10/21/2003   Heart murmur    Hematuria 12/19/2015   History of kidney stones    Lyme disease     Patient Active Problem List   Diagnosis Date Noted   Renal calculus 04/29/2023   Exertional dyspnea 04/24/2023   Incidental pulmonary nodule, > 3mm and < 8mm 03/26/2023   Hyperlipidemia 03/26/2023   HNP (herniated nucleus pulposus), cervical 03/10/2023   Hospital discharge follow-up 08/20/2022   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 08/20/2022   Screening for diabetes mellitus (DM) 08/20/2022   Essential hypertension 08/08/2022   Erectile dysfunction 03/03/2016   Coronary artery disease 03/03/2016   Neck pain 01/17/2016   Nephrolithiasis 01/17/2016   History of tobacco abuse 01/17/2016   Adrenal adenoma 01/17/2016   History of CVA (cerebrovascular accident) 01/17/2016   Cholelithiasis 01/17/2016   Pulmonary nodules 01/17/2016   Diverticulosis 01/17/2016   Marijuana use 01/17/2016    Past Surgical History:  Procedure Laterality Date   ANTERIOR CERVICAL DECOMP/DISCECTOMY FUSION N/A 03/10/2023   Procedure: CERVICAL FOUR-FIVE ANTERIOR CERVICAL DECOMPRESSION/DISCECTOMY FUSION;  Surgeon: Gillie Duncans, MD;  Location: [facility] OR;  Service: Neurosurgery;  Laterality: N/A;  RM 21 3C   APPENDECTOMY     CORONARY ANGIOPLASTY WITH STENT PLACEMENT  2005   CORONARY STENT INTERVENTION N/A 08/13/2022  Procedure: CORONARY STENT INTERVENTION;  Surgeon: Verlin Lonni BIRCH, MD;  Location: [facility];  Service: Cardiovascular;  Laterality: N/A;   CYSTOSCOPY/URETEROSCOPY/HOLMIUM LASER/STENT PLACEMENT Right 05/18/2023   Procedure: CYTSOSCOPY, RIGHT URETERAL STENT REMOVAL, DIAGNOSTIC URETEROSCOPY;  Surgeon: Renda Glance, MD;  Location: [facility] ORS;  Service: Urology;  Laterality: Right;  90 MINUTES NEEDED FOR CASE   INGUINAL HERNIA REPAIR Right    LEFT HEART CATH AND CORONARY ANGIOGRAPHY N/A  08/13/2022   Procedure: LEFT HEART CATH AND CORONARY ANGIOGRAPHY;  Surgeon: Verlin Lonni BIRCH, MD;  Location: [facility];  Service: Cardiovascular;  Laterality: N/A;   LITHOTRIPSY  X 2   NEPHROLITHOTOMY Right 04/29/2023   Procedure: RIGHT NEPHROLITHOTOMY PERCUTANEOUS-SURGEON ACCESS;  Surgeon: Cam Morene ORN, MD;  Location: [facility];  Service: Urology;  Laterality: Right;  210 MINUTES       Home Medications    Prior to Admission medications   Medication Sig Start Date End Date Taking? Authorizing Provider  albuterol  (VENTOLIN  HFA) 108 (90 Base) MCG/ACT inhaler Inhale 2 puffs into the lungs every 6 (six) hours as needed for wheezing or shortness of breath. 10/19/23   Howell Lunger, DO  aspirin  EC 81 MG tablet Take 1 tablet (81 mg total) by mouth daily. Swallow whole. 07/07/23   Santo Stanly LABOR, MD  clopidogrel  (PLAVIX ) 75 MG tablet Take 75 mg by mouth daily.    [provider]  doxycycline  (VIBRA -TABS) 100 MG tablet Take 1 tablet (100 mg total) by mouth 2 (two) times daily for 28 days. 05/02/24 05/30/24  Howell Lunger, DO  nitroGLYCERIN  (NITROSTAT ) 0.4 MG SL tablet Place 1 tablet (0.4 mg total) under the tongue every 5 (five) minutes as needed for chest pain. 07/07/22   Chandrasekhar, Stanly LABOR, MD  pantoprazole  (PROTONIX ) 20 MG tablet TAKE 1 TABLET BY MOUTH EVERY DAY 02/03/24   Loistine Sober, NP  rosuvastatin  (CRESTOR ) 5 MG tablet TAKE 1 TABLET (5 MG TOTAL) BY MOUTH DAILY. 08/31/23   Santo Stanly LABOR, MD  traMADol  (ULTRAM ) 50 MG tablet Take 1 tablet (50 mg total) by mouth every 6 (six) hours as needed for up to 10 doses. 02/01/24   Corey Rufus HERO, MD    Family History Family History  Problem Relation Age of Onset   Cancer Mother        Hodgkins lymphoma   Breast cancer Sister    Heart attack Other     Social History Social History   Tobacco Use   Smoking status: Former    Current packs/day: 0.00    Average packs/day: 3.0 packs/day for  40.0 years (120.0 ttl pk-yrs)    Types: Cigarettes    Start date: 10/21/1975    Quit date: 10/21/2015    Years since quitting: 8.5   Smokeless tobacco: Former    Types: Chew   Tobacco comments:    chewed some when I was young; not for long  Vaping Use   Vaping status: Former  Substance Use Topics   Alcohol use: Yes    Comment: rare   Drug use: Yes    Types: Marijuana     Allergies   Bee venom, Penicillins, and Atorvastatin    Review of Systems Review of Systems  Respiratory:  Positive for cough.   Cardiovascular:  Positive for chest pain.  Neurological:  Positive for headaches.   Per HPI  Physical Exam Triage Vital Signs ED Triage Vitals  Encounter Vitals Group     BP 05/11/24 1051 127/85  Girls Systolic BP Percentile --      Girls Diastolic BP Percentile --      Boys Systolic BP Percentile --      Boys Diastolic BP Percentile --      Pulse Rate 05/11/24 1051 63     Resp 05/11/24 1051 16     Temp 05/11/24 1051 97.8 F (36.6 C)     Temp Source 05/11/24 1051 Oral     SpO2 05/11/24 1051 97 %     Weight --      Height --      Head Circumference --      Peak Flow --      Pain Score 05/11/24 1048 2     Pain Loc --      Pain Education --      Exclude from Growth Chart --    No data found.  Updated Vital Signs BP 127/85 (BP Location: Right Arm)   Pulse 63   Temp 97.8 F (36.6 C) (Oral)   Resp 16   SpO2 97%   Visual Acuity Right Eye Distance:   Left Eye Distance:   Bilateral Distance:    Right Eye Near:   Left Eye Near:    Bilateral Near:     Physical Exam Vitals and nursing note reviewed.  Constitutional:      General: He is awake. He is not in acute distress.    Appearance: Normal appearance. He is well-developed and well-groomed. He is not ill-appearing.  HENT:     Right Ear: Tympanic membrane, ear canal and external ear normal.     Left Ear: Tympanic membrane, ear canal and external ear normal.     Nose: Nose normal.     Mouth/Throat:      Mouth: Mucous membranes are moist.     Pharynx: Oropharynx is clear.  Cardiovascular:     Rate and Rhythm: Normal rate and regular rhythm.  Pulmonary:     Effort: Pulmonary effort is normal.     Breath sounds: Normal breath sounds.  Musculoskeletal:        General: Normal range of motion.  Skin:    General: Skin is warm and dry.  Neurological:     General: No focal deficit present.     Mental Status: He is alert and oriented to person, place, and time. Mental status is at baseline.     GCS: GCS eye subscore is 4. GCS verbal subscore is 5. GCS motor subscore is 6.     Cranial Nerves: Cranial nerves 2-12 are intact.     Sensory: Sensation is intact.     Motor: Motor function is intact.     Coordination: Coordination is intact.     Gait: Gait is intact.  Psychiatric:        Behavior: Behavior is cooperative.      UC Treatments / Results  Labs (all labs ordered are listed, but only abnormal results are displayed) Labs Reviewed - No data to display  EKG   Radiology No results found.  Procedures Procedures (including critical care time)  Medications Ordered in UC Medications - No data to display  Initial Impression / Assessment and Plan / UC Course  I have reviewed the triage vital signs and the nursing notes.  Pertinent labs & imaging results that were available during my care of the patient were reviewed by me and considered in my medical decision making (see chart for details).     Patient is overall well-appearing.  Vitals are stable.  Lung and heart sounds normal.  No neurodeficits noted.  GCS 15.  EKG reveals T wave abnormality that does appear different from previous EKG performed in April 2025.  EKG does not reveal ST elevation or acute cardiac findings.  Recommended patient be seen in the emergency department for further evaluation of chest pain due to history of stent placement and previous complication with this.  Patient is agreeable to plan at this time.   Patient is stable to arrived to the ER via POV. Final Clinical Impressions(s) / UC Diagnoses   Final diagnoses:  Chest pain, unspecified type     Discharge Instructions      I recommend that you go to the emergency department for further evaluation of your chest pain due to your cardiac history.   ED Prescriptions   None    PDMP not reviewed this encounter.   Johnie Flaming A, NP 05/11/24 1121

## 2024-05-11 NOTE — ED Triage Notes (Signed)
 Pt reports my heart hurts for 4 weeks. Reports been intermittent. Reports has stents and had issues with them being blocked before. Hx 2 strokes.   Pt reports was exposed to RSV 4 weeks ago Reports cough for 3 weeks.   Hx Lyme disease. Gets ticks on him a lot since I stay in the woods  Took Tylenol  couple hours ago. Takes Aspirin  couple times a day.   Pt reports also had headaches for 3-4 weeks.

## 2024-05-11 NOTE — ED Notes (Signed)
 Patient is being discharged from the Urgent Care and sent to the Emergency Department via POV. Per Rumaldo Ryder, NP, patient is in need of higher level of care due to chest pain. Patient is aware and verbalizes understanding of plan of care.  Vitals:   05/11/24 1051  BP: 127/85  Pulse: 63  Resp: 16  Temp: 97.8 F (36.6 C)  SpO2: 97%

## 2024-05-11 NOTE — Discharge Instructions (Signed)
 I recommend that you go to the emergency department for further evaluation of your chest pain due to your cardiac history.

## 2024-05-12 NOTE — Telephone Encounter (Signed)
 Called pt to offer OV tomorrow 05/13/24 at 8:40 am.  Pt reports does not need an OV will be going to the ED per Urgent Care recommendation.   Advised pt to call back if has further concerns.

## 2024-05-13 ENCOUNTER — Other Ambulatory Visit: Payer: Self-pay

## 2024-05-13 ENCOUNTER — Other Ambulatory Visit: Payer: Self-pay | Admitting: Student

## 2024-05-13 ENCOUNTER — Emergency Department (HOSPITAL_COMMUNITY)
Admission: EM | Admit: 2024-05-13 | Discharge: 2024-05-13 | Disposition: A | Discharge status: Home / Self Care | Attending: Emergency Medicine | Admitting: Emergency Medicine

## 2024-05-13 ENCOUNTER — Emergency Department (HOSPITAL_COMMUNITY)

## 2024-05-13 DIAGNOSIS — I25118 Atherosclerotic heart disease of native coronary artery with other forms of angina pectoris: Secondary | ICD-10-CM

## 2024-05-13 DIAGNOSIS — R079 Chest pain, unspecified: Secondary | ICD-10-CM | POA: Diagnosis present

## 2024-05-13 DIAGNOSIS — R001 Bradycardia, unspecified: Secondary | ICD-10-CM | POA: Diagnosis not present

## 2024-05-13 DIAGNOSIS — I2510 Atherosclerotic heart disease of native coronary artery without angina pectoris: Secondary | ICD-10-CM | POA: Diagnosis not present

## 2024-05-13 DIAGNOSIS — Z7982 Long term (current) use of aspirin: Secondary | ICD-10-CM | POA: Insufficient documentation

## 2024-05-13 DIAGNOSIS — R0609 Other forms of dyspnea: Secondary | ICD-10-CM

## 2024-05-13 DIAGNOSIS — I10 Essential (primary) hypertension: Secondary | ICD-10-CM

## 2024-05-13 DIAGNOSIS — E785 Hyperlipidemia, unspecified: Secondary | ICD-10-CM | POA: Diagnosis not present

## 2024-05-13 DIAGNOSIS — R0789 Other chest pain: Secondary | ICD-10-CM | POA: Diagnosis not present

## 2024-05-13 DIAGNOSIS — R059 Cough, unspecified: Secondary | ICD-10-CM | POA: Insufficient documentation

## 2024-05-13 DIAGNOSIS — R072 Precordial pain: Secondary | ICD-10-CM

## 2024-05-13 LAB — TROPONIN I (HIGH SENSITIVITY)
Troponin I (High Sensitivity): 4 ng/L (ref <18)
Troponin I (High Sensitivity): 5 ng/L (ref <18)

## 2024-05-13 LAB — BASIC METABOLIC PANEL WITH GFR
Anion gap: 10 (ref 5–15)
BUN: 28 mg/dL — ABNORMAL HIGH (ref 6–20)
CO2: 23 mmol/L (ref 22–32)
Calcium: 9.2 mg/dL (ref 8.9–10.3)
Chloride: 105 mmol/L (ref 98–111)
Creatinine, Ser: 1.02 mg/dL (ref 0.61–1.24)
GFR, Estimated: >60 mL/min (ref >60)
Glucose, Bld: 109 mg/dL — ABNORMAL HIGH (ref 70–99)
Potassium: 3.8 mmol/L (ref 3.5–5.1)
Sodium: 138 mmol/L (ref 135–145)

## 2024-05-13 LAB — CBC
HCT: 42 % (ref 39.0–52.0)
Hemoglobin: 13.8 g/dL (ref 13.0–17.0)
MCH: 30.4 pg (ref 26.0–34.0)
MCHC: 32.9 g/dL (ref 30.0–36.0)
MCV: 92.5 fL (ref 80.0–100.0)
Platelets: 208 K/uL (ref 150–400)
RBC: 4.54 MIL/uL (ref 4.22–5.81)
RDW: 12.6 % (ref 11.5–15.5)
WBC: 5.7 K/uL (ref 4.0–10.5)
nRBC: 0 % (ref 0.0–0.2)

## 2024-05-13 LAB — RESP PANEL BY RT-PCR (RSV, FLU A&B, COVID)  RVPGX2
Influenza A by PCR: NEGATIVE
Influenza B by PCR: NEGATIVE
Resp Syncytial Virus by PCR: NEGATIVE
SARS Coronavirus 2 by RT PCR: NEGATIVE

## 2024-05-13 NOTE — ED Provider Notes (Signed)
  Physical Exam  BP 137/86   Pulse (!) 50   Temp 98.2 F (36.8 C) (Oral)   Resp 15   Ht 5' 10 (1.778 m)   Wt 77.1 kg   SpO2 100%   BMI 24.39 kg/m   Physical Exam Vitals reviewed.  Constitutional:      General: He is not in acute distress.    Appearance: He is not toxic-appearing or diaphoretic.  Eyes:     Extraocular Movements: Extraocular movements intact.  Cardiovascular:     Rate and Rhythm: Bradycardia present.     Heart sounds: No murmur heard.    No gallop.  Pulmonary:     Effort: Pulmonary effort is normal. No respiratory distress.  Skin:    General: Skin is warm and dry.  Neurological:     Mental Status: He is alert and oriented to person, place, and time. Mental status is at baseline.     ED Course / MDM   Clinical Course as of 05/13/24 1802  Fri May 13, 2024  1546 Chest pain similar to prior stenting, discussed with cardiology already, pending plan per cardiology. [AD]    Clinical Course User Index [AD] Raoul Rake, MD   Medical Decision Making Patient initially presented to the ED with complaint of chest pain for 4 weeks that he is concerned is related to his cardiac stents. Per initial ED provider team, patient's cardiac workup has been unremarkable. Cardiology has been consulted and at time of handoff was pending final recommendations for dispo per cardiology.  Shortly after handoff, I discussed patient with cardiology team who stated no current indication for admission or further emergent workup. Recommended close outpatient f/u on Monday with their team for stress testing, which they will help arrange with patient. I re-evaluated patient and reiterated cardiology's recommended plan which he is in agreement with. Aside from expressing complaints about having to use a username/password to log in to MyChart, patient is in no acute distress and deemed stable for discharge with strict return precautions and follow up as above.   Amount and/or Complexity  of Data Reviewed Labs: ordered. Radiology: ordered.      Raoul Rake, MD 05/14/24 1241    Freddi Hamilton, MD 05/16/24 [phone]

## 2024-05-13 NOTE — Consult Note (Signed)
 Cardiology Consultation   Patient ID: Leon Allen. MRN: [id number]; DOB: 05/27/1966  Admit date: 05/13/2024 Date of Consult: 05/13/2024  PCP:  Howell Lunger, DO   [facility] Providers Cardiologist:  Stanly DELENA Leavens, MD      Patient Profile: Leon Allen. is a 58 y.o. male with a hx of hypertension, hyperlipidemia, prior substance abuse, adrenal adenoma, nephrolithiasis, and CAD with 2 prior stents placed in LAD who is being seen 05/13/2024 for the evaluation of chest pain at the request of Edsel Dade MD.  History of Present Illness: Leon Allen is a 58 year old male with prior cardiac history listed below. Has a remote history of a stent placement in 2005.  Ended up receiving PCI to the mid LAD. Cardiac catheterization on 07/2022 showed 30% stenosis in the mid to proximal circumflex, 30% stenosis in the proximal RCA, and 90% stenosis in the mid LAD.  A drug-eluting stent was placed in the LAD.  Per the patient's last visit notes with Dr. Leavens on 06/2023 he was instructed to stop Plavix , start aspirin  81 mg daily, and reduce Imdur  to 30 mg daily  Presented to the emergency department for 4 weeks of left-sided substernal chest pain that does not radiate.  On interview reported he also had a headache, a cough, shortness of breath, and daytime fatigue for about the past month.  Denied fever, chills, diaphoresis, nausea, vomiting, and lower extremity edema. patient was also concerned because he recently had a tick bite.  He visited his PCP about on 05/02/2024 and was placed on doxycycline .  Reported that his chest pain is better with exertion.  Feels like chest discomfort was similar to prior heart attack but did not have the additional symptoms that he has had this time.  Feels like chest discomfort is better than it was 4 weeks ago.  He did have current ongoing 1-2 out of 10 pain.  Reported that he was exposed to someone who had RSV 3 weeks ago and has  had some upper respiratory congestion since.  Discussed with patient and he is unwilling to stay in the hospital over the weekend if he needs a cardiac catheterization.  Smokes marijuana every night before bed to help with dreams that he has.  Patient feels like the medications he takes are causing him to have these dreams. Vacuums at home and runs a weed wacker in the yard once a week.  Is able to do more than 4 metabolic equivalents of exertion.  Reported that he is no longer taking the Imdur .  He also reported taking 4 baby aspirin  daily, and half the dose of his Plavix .  Chest x-ray showed no active pulmonary disease.  EKG showed normal sinus rhythm with a rate of 67.  Labs showed negative high-sensitivity troponins 4> 5, potassium 3.8, creatinine 1.02, hemoglobin normal at 13.8.  Respiratory panel negative.  Past Medical History:  Diagnosis Date   Adrenal adenoma    bilateral   Complication of anesthesia    @ dentist office; was overdosed   Coronary artery disease    CVA (cerebral vascular accident) (HCC) 10/20/2013   denies residual on 01/17/2016   Gallstones    GERD (gastroesophageal reflux disease)    Gonorrhea 10/20/2013   Heart attack (HCC) 10/21/2003   Heart murmur    Hematuria 12/19/2015   History of kidney stones    Lyme disease     Past Surgical History:  Procedure Laterality Date   ANTERIOR CERVICAL DECOMP/DISCECTOMY FUSION  N/A 03/10/2023   Procedure: CERVICAL FOUR-FIVE ANTERIOR CERVICAL DECOMPRESSION/DISCECTOMY FUSION;  Surgeon: Gillie Duncans, MD;  Location: [facility] OR;  Service: Neurosurgery;  Laterality: N/A;  RM 21 3C   APPENDECTOMY     CORONARY ANGIOPLASTY WITH STENT PLACEMENT  2005   CORONARY STENT INTERVENTION N/A 08/13/2022   Procedure: CORONARY STENT INTERVENTION;  Surgeon: Verlin Lonni BIRCH, MD;  Location: [facility] INVASIVE CV LAB;  Service: Cardiovascular;  Laterality: N/A;   CYSTOSCOPY/URETEROSCOPY/HOLMIUM LASER/STENT PLACEMENT Right 05/18/2023   Procedure:  CYTSOSCOPY, RIGHT URETERAL STENT REMOVAL, DIAGNOSTIC URETEROSCOPY;  Surgeon: Renda Glance, MD;  Location: [facility] ORS;  Service: Urology;  Laterality: Right;  90 MINUTES NEEDED FOR CASE   INGUINAL HERNIA REPAIR Right    LEFT HEART CATH AND CORONARY ANGIOGRAPHY N/A 08/13/2022   Procedure: LEFT HEART CATH AND CORONARY ANGIOGRAPHY;  Surgeon: Verlin Lonni BIRCH, MD;  Location: [facility] INVASIVE CV LAB;  Service: Cardiovascular;  Laterality: N/A;   LITHOTRIPSY  X 2   NEPHROLITHOTOMY Right 04/29/2023   Procedure: RIGHT NEPHROLITHOTOMY PERCUTANEOUS-SURGEON ACCESS;  Surgeon: Cam Morene ORN, MD;  Location: [facility] ORS;  Service: Urology;  Laterality: Right;  210 MINUTES     Home Medications:  Prior to Admission medications   Medication Sig Start Date End Date Taking? Authorizing Provider  albuterol  (VENTOLIN  HFA) 108 (90 Base) MCG/ACT inhaler Inhale 2 puffs into the lungs every 6 (six) hours as needed for wheezing or shortness of breath. 10/19/23  Yes Howell Lunger, DO  aspirin  325 MG tablet Take 325 mg by mouth daily.   Yes [provider]  clopidogrel  (PLAVIX ) 75 MG tablet Take 37.5 mg by mouth daily.   Yes [provider]  doxycycline  (VIBRA -TABS) 100 MG tablet Take 1 tablet (100 mg total) by mouth 2 (two) times daily for 28 days. 05/02/24 05/30/24 Yes Howell Lunger, DO  guaiFENesin (MUCINEX) 600 MG 12 hr tablet Take 1,200 mg by mouth 2 (two) times daily.   Yes [provider]  metoprolol  succinate (TOPROL -XL) 25 MG 24 hr tablet Take 25 mg by mouth daily. 05/09/24  Yes [provider]  nitroGLYCERIN  (NITROSTAT ) 0.4 MG SL tablet Place 1 tablet (0.4 mg total) under the tongue every 5 (five) minutes as needed for chest pain. 07/07/22  Yes Chandrasekhar, Mahesh A, MD  pantoprazole  (PROTONIX ) 20 MG tablet TAKE 1 TABLET BY MOUTH EVERY DAY 02/03/24  Yes Wittenborn, Barnie, NP  tamsulosin  (FLOMAX ) 0.4 MG CAPS capsule Take 0.4 mg by mouth daily as needed (when he feels he needs  it). 12/16/23  Yes [provider]  rosuvastatin  (CRESTOR ) 5 MG tablet TAKE 1 TABLET (5 MG TOTAL) BY MOUTH DAILY. Patient not taking: Reported on 05/13/2024 08/31/23   Santo Stanly LABOR, MD    Scheduled Meds:  Continuous Infusions:  PRN Meds:   Allergies:    Allergies  Allergen Reactions   Bee Venom Anaphylaxis   Penicillins Other (See Comments)    Childhood Allergy    Atorvastatin  Other (See Comments)    Chest pressure     Social History:   Social History   Socioeconomic History   Marital status: Divorced    Spouse name: Not on file   Number of children: 2   Years of education: Not on file   Highest education level: Not on file  Occupational History   Not on file  Tobacco Use   Smoking status: Former    Current packs/day: 0.00    Average packs/day: 3.0 packs/day for 40.0 years (120.0 ttl pk-yrs)    Types:  Cigarettes    Start date: 10/21/1975    Quit date: 10/21/2015    Years since quitting: 8.5   Smokeless tobacco: Former    Types: Chew   Tobacco comments:    chewed some when I was young; not for long  Vaping Use   Vaping status: Former  Substance and Sexual Activity   Alcohol use: Yes    Comment: rare   Drug use: Yes    Types: Marijuana   Sexual activity: Yes  Other Topics Concern   Not on file  Social History Narrative   Lives home alone   Social Drivers of Health   Financial Resource Strain: High Risk (07/07/2022)   Overall Financial Resource Strain (CARDIA)    Difficulty of Paying Living Expenses: Hard  Food Insecurity: No Food Insecurity (04/30/2023)   Hunger Vital Sign    Worried About Running Out of Food in the Last Year: Never true    Ran Out of Food in the Last Year: Never true  Transportation Needs: No Transportation Needs (04/30/2023)   PRAPARE - Administrator, Civil Service (Medical): No    Lack of Transportation (Non-Medical): No  Physical Activity: Sufficiently Active (08/20/2022)   Exercise Vital Sign    Days  of Exercise per Week: 7 days    Minutes of Exercise per Session: 60 min  Stress: No Stress Concern Present (08/20/2022)   [employer] of Occupational Health - Occupational Stress Questionnaire    Feeling of Stress : Only a little  Social Connections: Socially Isolated (08/20/2022)   Social Connection and Isolation Panel    Frequency of Communication with Friends and Family: More than three times a week    Frequency of Social Gatherings with Friends and Family: Twice a week    Attends Religious Services: Never    Database administrator or Organizations: No    Attends Banker Meetings: Never    Marital Status: Divorced  Catering manager Violence: Not At Risk (04/30/2023)   Humiliation, Afraid, Rape, and Kick questionnaire    Fear of Current or Ex-Partner: No    Emotionally Abused: No    Physically Abused: No    Sexually Abused: No    Family History:    Family History  Problem Relation Age of Onset   Cancer Mother        Hodgkins lymphoma   Breast cancer Sister    Heart attack Other      ROS:  Please see the history of present illness.   All other ROS reviewed and negative.     Physical Exam/Data: Vitals:   05/13/24 1101 05/13/24 1330 05/13/24 1345 05/13/24 1400  BP: 117/81 130/85 128/86 131/85  Pulse: (!) 57 (!) 50 (!) 52 (!) 54  Resp: 15 15 16 12   Temp:      TempSrc:      SpO2: 100% 100% 100% 100%  Weight:      Height:       No intake or output data in the 24 hours ending 05/13/24 1520    05/13/2024   10:31 AM 05/02/2024    3:15 PM 03/29/2024    3:25 PM  Last 3 Weights  Weight (lbs) 170 lb 181 lb 8 oz 186 lb  Weight (kg) 77.111 kg 82.328 kg 84.369 kg     Body mass index is 24.39 kg/m.  General:  Well nourished, well developed, in no acute distress. HEENT: normal Neck: no JVD Vascular: No carotid bruits; Distal pulses 2+  bilaterally Cardiac:  normal S1, S2; RRR; no murmur. Lungs: Diffuse wheezing heard throughout the lungs Abd: soft,  nontender, no hepatomegaly  Ext: no edema Musculoskeletal:  No deformities. Skin: warm and dry  Neuro:  no focal abnormalities noted Psych:  Normal affect   EKG:  The EKG was personally reviewed and demonstrates:  EKG showed normal sinus rhythm with a rate of 67. Telemetry:  Telemetry was personally reviewed and demonstrates: Atrial fibrillation with heart rate in the 50's  Relevant CV Studies:   Laboratory Data: High Sensitivity Troponin:   Recent Labs  Lab 05/13/24 1128  TROPONINIHS 4     Chemistry Recent Labs  Lab 05/13/24 1128  NA 138  K 3.8  CL 105  CO2 23  GLUCOSE 109*  BUN 28*  CREATININE 1.02  CALCIUM  9.2  GFRNONAA >60  ANIONGAP 10    No results for input(s): PROT, ALBUMIN, AST, ALT, ALKPHOS, BILITOT in the last 168 hours. Lipids No results for input(s): CHOL, TRIG, HDL, LABVLDL, LDLCALC, CHOLHDL in the last 168 hours.  Hematology Recent Labs  Lab 05/13/24 1128  WBC 5.7  RBC 4.54  HGB 13.8  HCT 42.0  MCV 92.5  MCH 30.4  MCHC 32.9  RDW 12.6  PLT 208   Thyroid  No results for input(s): TSH, FREET4 in the last 168 hours.  BNPNo results for input(s): BNP, PROBNP in the last 168 hours.  DDimer No results for input(s): DDIMER in the last 168 hours.  Radiology/Studies:  DG Chest 2 View Result Date: 05/13/2024 CLINICAL DATA:  Chest pain EXAM: CHEST - 2 VIEW COMPARISON:  X-ray 10/04/2023.  CT 11/05/2023 FINDINGS: No consolidation, pneumothorax or effusion. No edema. Normal cardiopericardial silhouette. The inferior costophrenic angles are clipped off the edge of the film on the lateral view. Overlapping cardiac leads. IMPRESSION: No acute cardiopulmonary disease. Electronically Signed   By: Ranell Bring M.D.   On: 05/13/2024 11:34     Assessment and Plan:  Leon Allen. is a 58 y.o. male with a hx of hypertension, hyperlipidemia, prior substance abuse, adrenal adenoma, nephrolithiasis, Lyme's disease, and CAD who is  being seen 05/13/2024 for the evaluation of chest pain at the request of Edsel Dade MD.  Chest pain CAD with 2 prior stents placed in LAD Hyperlipidemia Has a remote history of a stent placement in 2005.  Ended up receiving PCI to the mid LAD. Cardiac catheterization on 07/2022 showed 30% stenosis in the mid to proximal circumflex, 30% stenosis in the proximal RCA, and 90% stenosis in the mid LAD.  A drug-eluting stent was placed in the LAD. Presented to the emergency department for 4 weeks of left-sided substernal chest pain.  Has also had a headache, a cough, and daytime fatigue for about the past month. Patient was also concerned because he recently had a tick bite.  He visited his PCP about on 05/02/2024 and was placed on doxycycline .  Reported that his chest pain is better with exertion.  Feels like his chest discomfort has improved slightly after he coughed multiple times yesterday.  He was exposed to someone with RSV 3 weeks ago and has had some upper respiratory congestion since. Discussed with patient and he is unwilling to stay in the hospital over the weekend if he needs a cardiac catheterization.  High-sensitivity troponins negative.  No ischemic changes on EKG. Chest pain is atypical for ACS.  Patient is also unwilling to wait in the hospital over the weekend to get a cardiac cath on  Monday. Continue Crestor  5 mg daily.  Patient has been hesitant to increase statin in the past.  Recommend decreasing aspirin  to 81 mg daily Recommend stopping Plavix  Plan on outpatient follow-up with cardiology Dr Ladona to consent for outpatient stress test.   Lyme's disease Patient has had Lyme's disease in the past.  In addition to chest pain he has also had a headache, a cough, and daytime fatigue for about the past month. Patient was also concerned because he recently had a tick bite.  He visited his PCP about on 05/02/2024 and was placed on doxycycline .  Patient is concerned because the symptoms have  not resolved.   Marijuana abuse Smokes marijuana every night before bed.  States that he does this to help with sleep.   Hypertension Blood pressure has been normotensive in the ER.  Most recent BP 137/88. Continue metoprolol  25 mg daily   Otherwise manage per primary    Risk Assessment/Risk Scores:           [facility] will sign off.   The patient is ready for discharge today from a cardiac standpoint. Medication Recommendations:  as above Other recommendations (labs, testing, etc):  nuclear stress test Follow up as an outpatient:  schedule follow up.  For questions or updates, please contact [facility] Please consult [email or web address] for contact info under    Signed, Morse Clause, PA-C  05/13/2024 3:20 PM

## 2024-05-13 NOTE — ED Notes (Signed)
 Provided patient with sandwhich, crackers, and water 

## 2024-05-13 NOTE — Discharge Instructions (Addendum)
 You were seen today for chest pain. While you were here we monitored your vitals, performed a physical exam, and checked labs + EKG. These were all reassuring and there is no indication for any further testing or intervention in the emergency department at this time.   Things to do:  - Follow up with your primary care provider within the next 1-2 weeks as needed - Schedule follow up cardiac stress test and clinic follow up with your cardiologist in the next few business days.

## 2024-05-13 NOTE — ED Notes (Signed)
 Patient provided with discharge paperwork. Pt demonstrated understanding of material. All questions, comments, and concerns addressed. Pt ambulated in hallway towards exit with no complications

## 2024-05-13 NOTE — Progress Notes (Signed)
 Nuclear stress test per Dr Ladona. Dr Ladona to consent

## 2024-05-13 NOTE — ED Triage Notes (Addendum)
 Pt arrives POV from home c/o chest pain for 4 weeks. I think my stents are blocked up again. This is what it felt like last time they were blocked up I'm not sure if it's respiratory or lime disease. Patient is on meds for lime disease Called cardiologist. States cardiology wouldn't see me incase it was RSV and told me to go to urgent care. Hx 2 heart attacks, stent placed year and half ago.

## 2024-05-13 NOTE — ED Provider Notes (Signed)
 [place] EMERGENCY DEPARTMENT AT [facility] Provider Note   CSN: [id number] Arrival date & time: 05/13/24  1014     Patient presents with: No chief complaint on file.   Leon Allen. is a 58 y.o. male.   Patient with history of CAD, CVA, GERD, MI, Lyme disease presents today with complaints of chest pain. He reports that his pain began initially 4 weeks ago after having Mohs surgery and has been persistent since then. It feels like an aching pain. No aggravating or relieving factors. Reports pain is left sided, does not radiate. Reports pain feels like previous heart attack where he had stents placed. Reports he has been taking aspirin  and half of his plavix  daily for management. Pain is not exertional or relieved with rest. Pain actually seems to be relieved with exertion. He reports that he has nitroglycerin  but has not taken any. Notes that he has had exposure to RSV a few weeks ago and thought that could be driving his symptoms as he has had a slight cough that is improving. Also reports that he was bitten by a tick and has been covered with doxycycline  for Lyme disease which he is still taking. Denies fevers or chills. No shortness of breath, leg pain, or leg swelling. No diaphoresis, nausea, or vomiting. Called his cardiologist who recommended he come here for evaluation.    The history is provided by the patient. No language interpreter was used.       Prior to Admission medications   Medication Sig Start Date End Date Taking? Authorizing Provider  albuterol  (VENTOLIN  HFA) 108 (90 Base) MCG/ACT inhaler Inhale 2 puffs into the lungs every 6 (six) hours as needed for wheezing or shortness of breath. 10/19/23   Howell Lunger, DO  aspirin  EC 81 MG tablet Take 1 tablet (81 mg total) by mouth daily. Swallow whole. 07/07/23   Santo Stanly LABOR, MD  clopidogrel  (PLAVIX ) 75 MG tablet Take 75 mg by mouth daily.    [provider]  doxycycline  (VIBRA -TABS)  100 MG tablet Take 1 tablet (100 mg total) by mouth 2 (two) times daily for 28 days. 05/02/24 05/30/24  Howell Lunger, DO  nitroGLYCERIN  (NITROSTAT ) 0.4 MG SL tablet Place 1 tablet (0.4 mg total) under the tongue every 5 (five) minutes as needed for chest pain. 07/07/22   Santo Stanly LABOR, MD  pantoprazole  (PROTONIX ) 20 MG tablet TAKE 1 TABLET BY MOUTH EVERY DAY 02/03/24   Wittenborn, Barnie, NP  rosuvastatin  (CRESTOR ) 5 MG tablet TAKE 1 TABLET (5 MG TOTAL) BY MOUTH DAILY. 08/31/23   Chandrasekhar, Stanly LABOR, MD  traMADol  (ULTRAM ) 50 MG tablet Take 1 tablet (50 mg total) by mouth every 6 (six) hours as needed for up to 10 doses. 02/01/24   Paci, Karina M, MD    Allergies: Bee venom, Penicillins, and Atorvastatin     Review of Systems  Cardiovascular:  Positive for chest pain.  All other systems reviewed and are negative.   Updated Vital Signs BP 117/81   Pulse (!) 57   Temp 98.1 F (36.7 C) (Oral)   Resp 15   Ht 5' 10 (1.778 m)   Wt 77.1 kg   SpO2 100%   BMI 24.39 kg/m   Physical Exam Vitals and nursing note reviewed.  Constitutional:      General: He is not in acute distress.    Appearance: Normal appearance. He is normal weight. He is not ill-appearing, toxic-appearing or diaphoretic.  HENT:  Head: Normocephalic and atraumatic.  Cardiovascular:     Rate and Rhythm: Normal rate and regular rhythm.     Heart sounds: Normal heart sounds.  Pulmonary:     Effort: Pulmonary effort is normal. No respiratory distress.     Breath sounds: Normal breath sounds.  Abdominal:     General: Abdomen is flat.     Palpations: Abdomen is soft.     Tenderness: There is no abdominal tenderness.  Musculoskeletal:        General: Normal range of motion.     Cervical back: Normal range of motion.     Right lower leg: No edema.     Left lower leg: No edema.  Skin:    General: Skin is warm and dry.  Neurological:     General: No focal deficit present.     Mental Status: He is alert.   Psychiatric:        Mood and Affect: Mood normal.        Behavior: Behavior normal.     (all labs ordered are listed, but only abnormal results are displayed) Labs Reviewed  BASIC METABOLIC PANEL WITH GFR - Abnormal; Notable for the following components:      Result Value   Glucose, Bld 109 (*)    BUN 28 (*)    All other components within normal limits  RESP PANEL BY RT-PCR (RSV, FLU A&B, COVID)  RVPGX2  CBC  TROPONIN I (HIGH SENSITIVITY)  TROPONIN I (HIGH SENSITIVITY)    EKG: EKG Interpretation Date/Time:  Friday May 13 2024 10:29:51 EDT Ventricular Rate:  67 PR Interval:  156 QRS Duration:  89 QT Interval:  411 QTC Calculation: 434 R Axis:   36  Text Interpretation: Sinus rhythm Low voltage, extremity leads Confirmed by Levander Houston [phone]) on 05/13/2024 10:47:08 AM  Radiology: [facility] Chest 2 View Result Date: 05/13/2024 CLINICAL DATA:  Chest pain EXAM: CHEST - 2 VIEW COMPARISON:  X-ray 10/04/2023.  CT 11/05/2023 FINDINGS: No consolidation, pneumothorax or effusion. No edema. Normal cardiopericardial silhouette. The inferior costophrenic angles are clipped off the edge of the film on the lateral view. Overlapping cardiac leads. IMPRESSION: No acute cardiopulmonary disease. Electronically Signed   By: Ranell Bring M.D.   On: 05/13/2024 11:34     Procedures   Medications Ordered in the ED - No data to display  Clinical Course as of 05/13/24 1553  Fri May 13, 2024  1546 Chest pain similar to prior stenting, discussed with cardiology already, pending plan per cardiology. [AD]    Clinical Course User Index [AD] Raoul Rake, MD                                 Medical Decision Making Amount and/or Complexity of Data Reviewed Labs: ordered. Radiology: ordered.   This patient is a 58 y.o. male who presents to the ED for concern of chest pain, this involves an extensive number of treatment options, and is a complaint that carries with it a high risk of  complications and morbidity. The emergent differential diagnosis prior to evaluation includes, but is not limited to,  ACS, pericarditis, myocarditis, aortic dissection, PE, pneumothorax, esophageal rupture, pneumonia, reflux/PUD, biliary disease, pancreatitis, costochondritis, anxiety  This is not an exhaustive differential.   Past Medical History / Co-morbidities / Social History:  has a past medical history of Adrenal adenoma, Complication of anesthesia, Coronary artery disease, CVA (cerebral vascular accident) (HCC) (  10/20/2013), Gallstones, GERD (gastroesophageal reflux disease), Gonorrhea (10/20/2013), Heart attack (HCC) (10/21/2003), Heart murmur, Hematuria (12/19/2015), History of kidney stones, and Lyme disease.  Additional history: Chart reviewed. Pertinent results include: sent from urgent care given chest pain and previous MI with stent placement. Follows with cardiology Dr. Santo. Had stent in 2005 with revision in 2023.  Physical Exam: Physical exam performed. The pertinent findings include: well appearing, no acute physical exam abnormalities  Lab Tests: I ordered, and personally interpreted labs.  The pertinent results include:  troponin 4 --> 5. No acute laboratory abnormalities   Imaging Studies: I ordered imaging studies including CXR. I independently visualized and interpreted imaging which showed NAD. I agree with the radiologist interpretation.   Cardiac Monitoring:  The patient was maintained on a cardiac monitor.  My attending physician Dr. Levander viewed and interpreted the cardiac monitored which showed an underlying rhythm of: sinus rhythm, no STEMI. I agree with this interpretation.  Consultations Obtained: I requested consultation with the cardiology PA on call Artist Pouch,  and discussed lab and imaging findings as well as pertinent plan - they recommend: they will discussed with attending physician and determine plan   Disposition:  Cardiology  consultation is pending at shift change and will determine dispo  Care handoff to Isaiah Ross, MD at shift change. Please see their note for continued evaluation and dispo  I discussed this case with my attending physician Dr. Levander who cosigned this note including patient's presenting symptoms, physical exam, and planned diagnostics and interventions. Attending physician stated agreement with plan or made changes to plan which were implemented.    Final diagnoses:  None    ED Discharge Orders     None          Nora Lauraine DELENA DEVONNA 05/14/24 [id number]    Levander Houston, MD 05/30/24 1059

## 2024-05-18 ENCOUNTER — Encounter (HOSPITAL_COMMUNITY): Payer: Self-pay | Admitting: *Deleted

## 2024-05-18 ENCOUNTER — Telehealth (HOSPITAL_COMMUNITY): Payer: Self-pay | Admitting: *Deleted

## 2024-05-18 NOTE — Telephone Encounter (Signed)
 Letter sent with instructions for upcoming stress test via [employer]. Argentina Bees, RN

## 2024-05-23 ENCOUNTER — Other Ambulatory Visit: Payer: Self-pay | Admitting: Cardiology

## 2024-05-23 DIAGNOSIS — R079 Chest pain, unspecified: Secondary | ICD-10-CM

## 2024-05-26 ENCOUNTER — Ambulatory Visit (HOSPITAL_COMMUNITY)
Admission: RE | Admit: 2024-05-26 | Discharge: 2024-05-26 | Disposition: A | Discharge status: Home / Self Care | Source: Ambulatory Visit | Attending: Cardiology | Admitting: Cardiology

## 2024-05-26 DIAGNOSIS — J439 Emphysema, unspecified: Secondary | ICD-10-CM | POA: Diagnosis not present

## 2024-05-26 DIAGNOSIS — R079 Chest pain, unspecified: Secondary | ICD-10-CM | POA: Diagnosis not present

## 2024-05-26 DIAGNOSIS — M47819 Spondylosis without myelopathy or radiculopathy, site unspecified: Secondary | ICD-10-CM | POA: Diagnosis not present

## 2024-05-26 DIAGNOSIS — R918 Other nonspecific abnormal finding of lung field: Secondary | ICD-10-CM | POA: Diagnosis not present

## 2024-05-26 LAB — MYOCARDIAL PERFUSION IMAGING
LV dias vol: 128 mL (ref 62–150)
LV sys vol: 56 mL (ref 4.2–5.8)
Nuc Stress EF: 56 %
Peak HR: 92 {beats}/min
Rest HR: 49 {beats}/min
Rest Nuclear Isotope Dose: 10.9 mCi
SDS: 0
SRS: 0
SSS: 0
ST Depression (mm): 0 mm
Stress Nuclear Isotope Dose: 30.5 mCi
TID: 1.01

## 2024-05-26 MED ORDER — TECHNETIUM TC 99M TETROFOSMIN IV KIT
10.9000 | PACK | Freq: Once | INTRAVENOUS | Status: AC | PRN
  Administered 2024-05-26: 10.9 via INTRAVENOUS

## 2024-05-26 MED ORDER — REGADENOSON 0.4 MG/5ML IV SOLN
INTRAVENOUS | Status: AC
  Filled 2024-05-26: qty 5

## 2024-05-26 MED ORDER — REGADENOSON 0.4 MG/5ML IV SOLN
0.4000 mg | Freq: Once | INTRAVENOUS | Status: AC
Start: 2024-05-26 — End: 2024-05-26
  Administered 2024-05-26: 0.4 mg via INTRAVENOUS

## 2024-05-26 MED ORDER — TECHNETIUM TC 99M TETROFOSMIN IV KIT
30.5000 | PACK | Freq: Once | INTRAVENOUS | Status: AC | PRN
  Administered 2024-05-26: 30.5 via INTRAVENOUS

## 2024-05-27 ENCOUNTER — Ambulatory Visit: Payer: Self-pay | Admitting: Cardiology

## 2024-05-27 DIAGNOSIS — R5383 Other fatigue: Secondary | ICD-10-CM | POA: Diagnosis not present

## 2024-05-27 DIAGNOSIS — Z125 Encounter for screening for malignant neoplasm of prostate: Secondary | ICD-10-CM | POA: Diagnosis not present

## 2024-05-27 DIAGNOSIS — R739 Hyperglycemia, unspecified: Secondary | ICD-10-CM | POA: Diagnosis not present

## 2024-05-27 DIAGNOSIS — E559 Vitamin D deficiency, unspecified: Secondary | ICD-10-CM | POA: Diagnosis not present

## 2024-05-27 DIAGNOSIS — A64 Unspecified sexually transmitted disease: Secondary | ICD-10-CM | POA: Diagnosis not present

## 2024-05-27 DIAGNOSIS — Z0001 Encounter for general adult medical examination with abnormal findings: Secondary | ICD-10-CM | POA: Diagnosis not present

## 2024-05-27 DIAGNOSIS — E782 Mixed hyperlipidemia: Secondary | ICD-10-CM | POA: Diagnosis not present

## 2024-05-27 DIAGNOSIS — I10 Essential (primary) hypertension: Secondary | ICD-10-CM | POA: Diagnosis not present

## 2024-05-27 DIAGNOSIS — Z0101 Encounter for examination of eyes and vision with abnormal findings: Secondary | ICD-10-CM | POA: Diagnosis not present

## 2024-05-27 NOTE — Progress Notes (Signed)
 Stress test is normal. Patient of [facility]. I do not think I have seen him. Normal heart function and no ischemia. Low risk

## 2024-05-31 ENCOUNTER — Ambulatory Visit: Payer: Self-pay | Admitting: Cardiology

## 2024-05-31 NOTE — Progress Notes (Signed)
 Extracardiac abnormality included emphysema and pulmonary nodules previously noted on dedicated CT 11/05/2023.  Cholelithiasis.

## 2024-06-12 NOTE — Progress Notes (Unsigned)
 Cardiology Office Note    Date:  06/13/2024  ID:  Leon Allen., DOB 1966-10-20, MRN [id number] PCP:  Howell Lunger, DO  Cardiologist:  Stanly DELENA Leavens, MD  Electrophysiologist:  None   Chief Complaint: f/u stress test  History of Present Illness: .    [name]. is a 58 y.o. male with visit-pertinent history of CAD, prior tobacco abuse/THC/cocaine use, HTN, patient reported hx of CVA (tx with ASA), depression, adrenal adenomas on CT 2017 who is seen for post-cath follow-up. He had remote MI in 2005 with PCI/stent to LAD at that time, lost to follow-up. Remote echo 2017 EF 55-60%, trivial TR. He was seen in the office in 06/2022 with increasing chest pain, medical therapy initiated. He was having to eat peaches to deal with kidney pain that patient attributed to Imdur . 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 DAPT recommended for at least 6 months, longer if tolerated. Post cath, he reported continued chest pressure that he attributed to atorvastatin  so Imdur  was increased and atorvastatin  was stopped. At f/u 08/2022 lisinopril  was stopped for soft BP and he was started on rosuvastatin . He has not been amenable to higher doses. At f/u 06/2023, he requested to reduce his medication and was also reported to be self adjusting aspirin  dose. Therefore Plavix  was discontinued, ASA 81mg  was continued, and Imdur  was decreased. He was recently seen in the ED 04/2024 with chest pain, also had wheezing being treated for bronchitis and on antibiotics for tickbite. At that time he reported taking ASA 325mg  + 1/2 dose of Plavix  daily, no longer taking Imdur . Outpatient stress test was arranged. This was performed 05/2024 which showed artifact but no ischemia, EF 56%.  He  returns for follow-up today doing well from cardiac standpoint without any residual chest pain. No SOB, palpitations, edema. He has continued to deal with daily morning headache even in the absence of Imdur . He has been seeing his PCP for this. He completed 28 day course of doxycycline  for potential tickborne etiology. He is also seeking a new dentist as he reports hx of old infections in his teeth. He reports he is still taking the clopidogrel  because [facility] kept refilling it, but taking 1/2 dose due to easy bleeding. He also takes 325mg  ASA by his own preference.  Labwork independently reviewed: 04/2024 Trops neg, CBC wnl, K 3.7, Cr 1.02 09/2023 LFTs wnl 09/2022 LDL 61, trig 86  ROS: .    Please see the history of present illness All other systems are reviewed and otherwise negative.  Studies Reviewed: SABRA    EKG:  EKG is not ordered today  CV Studies: Cardiac studies reviewed are outlined and summarized above. Otherwise please see EMR for full report.   Current Reported Medications:.    Current Meds  Medication Sig   albuterol  (VENTOLIN  HFA) 108 (90 Base) MCG/ACT inhaler Inhale 2 puffs into the lungs every 6 (six) hours as needed for wheezing or shortness of breath.   aspirin  325 MG tablet Take 325 mg by mouth daily.   clopidogrel  (PLAVIX ) 75 MG tablet Take 37.5 mg by mouth daily.   guaiFENesin (MUCINEX) 600 MG 12 hr tablet Take 1,200 mg by mouth 2 (two) times daily.   metoprolol  succinate (TOPROL -XL) 25 MG 24 hr tablet Take 25 mg by mouth  daily.   nitroGLYCERIN  (NITROSTAT ) 0.4 MG SL tablet Place 1 tablet (0.4 mg total) under the tongue every 5 (five) minutes as needed for chest pain.   pantoprazole  (PROTONIX ) 20 MG tablet TAKE 1 TABLET BY MOUTH EVERY DAY   rosuvastatin  (CRESTOR ) 5 MG tablet TAKE 1 TABLET (5 MG TOTAL) BY MOUTH DAILY.   tamsulosin  (FLOMAX ) 0.4 MG CAPS capsule Take 0.4 mg by mouth daily as needed (when he feels he needs it).    Physical Exam:    VS:  BP 118/72   Pulse  60   Ht 5' 10 (1.778 m)   Wt 184 lb (83.5 kg)   SpO2 95%   BMI 26.40 kg/m    Wt Readings from Last 3 Encounters:  06/13/24 184 lb (83.5 kg)  05/13/24 170 lb (77.1 kg)  05/02/24 181 lb 8 oz (82.3 kg)    GEN: Well nourished, well developed in no acute distress NECK: No JVD; No carotid bruits CARDIAC: RRR, no murmurs, rubs, gallops RESPIRATORY:  Clear to auscultation without rales, wheezing or rhonchi  ABDOMEN: Soft, non-tender, non-distended EXTREMITIES:  No edema; No acute deformity   Asessement and Plan:.    1. CAD - doing well without recurrent chest pain. Stress test was reassuring. The patient has continued to take clopidogrel  due to his pharmacy refilling this. He reports easy bleeding when he cuts himself. I have reiterated Dr. Milan previous recommendations to stop Plavix . I told him he will need to let [facility] know he is no longer supposed to be taking this or they may continue to place him on an aggressive auto-refill program. He expresses a preference to continue 325mg  of aspirin  daily, I have relayed prior recommendation to reduce to 81mg  daily. Otherwise continue Toprol  25mg  daily and rosuvastatin  5mg  daily.  2. Lipid management - previously felt poorly on atorvastatin , tolerating low dose rosuvastatin . He does not desire to increase the dose further. Check fasting lipids/CMET today.  3. Essential HTN - controlled on Toprol  25mg  daily. No longer seems to be an active issue.  4. Substance use - denies cocaine, alcohol, tobacco use. He cut down THC use significantly from prior use but still smokes at night to ward off vivid dreams. Encouraged ongoing f/u of PCP for headaches and vivid dreams.   Disposition: F/u with Dr. Santo in 6 months.  Signed, Ruthene Methvin N Regino Fournet, PA-C

## 2024-06-13 ENCOUNTER — Encounter: Payer: Self-pay | Admitting: Physician Assistant

## 2024-06-13 ENCOUNTER — Ambulatory Visit: Attending: Physician Assistant | Admitting: Physician Assistant

## 2024-06-13 VITALS — BP 118/72 | HR 60 | Ht 70.0 in | Wt 184.0 lb

## 2024-06-13 DIAGNOSIS — I2510 Atherosclerotic heart disease of native coronary artery without angina pectoris: Secondary | ICD-10-CM | POA: Insufficient documentation

## 2024-06-13 DIAGNOSIS — F1911 Other psychoactive substance abuse, in remission: Secondary | ICD-10-CM | POA: Diagnosis not present

## 2024-06-13 DIAGNOSIS — Z136 Encounter for screening for cardiovascular disorders: Secondary | ICD-10-CM | POA: Diagnosis not present

## 2024-06-13 DIAGNOSIS — I10 Essential (primary) hypertension: Secondary | ICD-10-CM | POA: Insufficient documentation

## 2024-06-13 DIAGNOSIS — Z13220 Encounter for screening for lipoid disorders: Secondary | ICD-10-CM | POA: Insufficient documentation

## 2024-06-13 LAB — LIPID PANEL

## 2024-06-13 NOTE — Patient Instructions (Signed)
 Medication Instructions:   START TAKING: ASPIRIN  81 MG ONCE A DAY   STOP TAKING AND REMOVE THIS MEDICATION FROM YOUR MEDICATION LIST:  (CLOPIDOGREL ) PLAVIX   *If you need a refill on your cardiac medications before your next appointment, please call your pharmacy*   Lab Work:  PLEASE GO DOWN STAIRS  [facility]  FIRST FLOOR   ( GET OFF ELEVATORS WALK TOWARDS WAITING AREA LAB LOCATED BY PHARMACY): CMET AND LIPID TODAY     If you have labs (blood work) drawn today and your tests are completely normal, you will receive your results only by: MyChart Message (if you have MyChart) OR A paper copy in the mail If you have any lab test that is abnormal or we need to change your treatment, we will call you to review the results.   Testing/Procedures: NONE ORDERED  TODAY    Follow-Up: At [facility], you and your health needs are our priority.  As part of our continuing mission to provide you with exceptional heart care, our providers are all part of one team.  This team includes your primary Cardiologist (physician) and Advanced Practice Providers or APPs (Physician Assistants and Nurse Practitioners) who all work together to provide you with the care you need, when you need it.  Your next appointment:   6 month(s)  Provider:   Stanly DELENA Leavens, MD    We recommend signing up for the patient portal called MyChart.  Sign up information is provided on this After Visit Summary.  MyChart is used to connect with patients for Virtual Visits (Telemedicine).  Patients are able to view lab/test results, encounter notes, upcoming appointments, etc.  Non-urgent messages can be sent to your provider as well.   To learn more about what you can do with MyChart, go to [email or web address].   Other Instructions

## 2024-06-14 ENCOUNTER — Ambulatory Visit: Payer: Self-pay | Admitting: Physician Assistant

## 2024-06-14 LAB — COMPREHENSIVE METABOLIC PANEL WITH GFR
ALT: 25 IU/L (ref 0–44)
AST: 18 IU/L (ref 0–40)
Albumin: 4.3 g/dL (ref 3.8–4.9)
Alkaline Phosphatase: 79 IU/L (ref 44–121)
BUN/Creatinine Ratio: 22 — ABNORMAL HIGH (ref 9–20)
BUN: 21 mg/dL (ref 6–24)
Bilirubin Total: <0.2 mg/dL (ref 0.0–1.2)
CO2: 20 mmol/L (ref 20–29)
Calcium: 9.1 mg/dL (ref 8.7–10.2)
Chloride: 105 mmol/L (ref 96–106)
Creatinine, Ser: 0.97 mg/dL (ref 0.76–1.27)
Globulin, Total: 2 g/dL (ref 1.5–4.5)
Glucose: 101 mg/dL — ABNORMAL HIGH (ref 70–99)
Potassium: 4.7 mmol/L (ref 3.5–5.2)
Sodium: 139 mmol/L (ref 134–144)
Total Protein: 6.3 g/dL (ref 6.0–8.5)
eGFR: 90 mL/min/1.73 (ref >59)

## 2024-06-14 LAB — LIPID PANEL
Cholesterol, Total: 141 mg/dL (ref 100–199)
HDL: 36 mg/dL — AB (ref >39)
LDL CALC COMMENT:: 3.9 ratio (ref 0.0–5.0)
LDL Chol Calc (NIH): 76 mg/dL (ref 0–99)
Triglycerides: 171 mg/dL — AB (ref 0–149)
VLDL Cholesterol Cal: 29 mg/dL (ref 5–40)

## 2024-07-01 ENCOUNTER — Encounter: Payer: Self-pay | Admitting: Internal Medicine

## 2024-08-08 ENCOUNTER — Other Ambulatory Visit: Payer: Self-pay | Admitting: Student

## 2024-08-08 ENCOUNTER — Ambulatory Visit: Admitting: Dermatology

## 2024-08-10 ENCOUNTER — Telehealth: Payer: Self-pay | Admitting: Internal Medicine

## 2024-08-10 MED ORDER — PANTOPRAZOLE SODIUM 20 MG PO TBEC: 20.0000 mg | DELAYED_RELEASE_TABLET | Freq: Every day | ORAL | 2 refills | Status: AC

## 2024-08-10 NOTE — Telephone Encounter (Signed)
*  STAT* If patient is at the pharmacy, call can be transferred to refill team.   1. Which medications need to be refilled? (please list name of each medication and dose if known)   pantoprazole  (PROTONIX ) 20 MG tablet     2. Would you like to learn more about the convenience, safety, & potential cost savings by using the [facility]? no   3. Are you open to using the [facility] (Type [facility].  ).no   4. Which pharmacy/location (including street and city if local pharmacy) is medication to be sent to? [id number] [place], [place] - [address]     5. Do they need a 30 day or 90 day supply? 90 day    Pt is out of medication

## 2024-08-10 NOTE — Telephone Encounter (Signed)
 RX sent in

## 2024-08-16 DIAGNOSIS — Z87442 Personal history of urinary calculi: Secondary | ICD-10-CM | POA: Diagnosis not present

## 2024-11-09 ENCOUNTER — Other Ambulatory Visit: Payer: Self-pay | Admitting: Internal Medicine

## 2024-11-14 MED ORDER — METOPROLOL SUCCINATE ER 25 MG PO TB24: 25.0000 mg | ORAL_TABLET | Freq: Every day | ORAL | 1 refills | Status: AC
